# Patient Record
Sex: Female | Born: 1935 | Race: White | Hispanic: No | Smoking: Former smoker
Health system: Southern US, Community
[De-identification: ages and names within clinical notes are randomized; demographics above are authoritative.]

## PROBLEM LIST (undated history)

## (undated) DIAGNOSIS — A419 Sepsis, unspecified organism: Secondary | ICD-10-CM

## (undated) DIAGNOSIS — D6800 Von Willebrand disease, unspecified: Secondary | ICD-10-CM

## (undated) DIAGNOSIS — I4891 Unspecified atrial fibrillation: Secondary | ICD-10-CM

## (undated) HISTORY — DX: Sepsis, unspecified organism: A41.9

## (undated) HISTORY — PX: ABDOMINAL HYSTERECTOMY: SHX81

---

## 2004-03-10 ENCOUNTER — Ambulatory Visit: Payer: Self-pay | Admitting: Ophthalmology

## 2005-07-20 ENCOUNTER — Ambulatory Visit: Payer: Self-pay | Admitting: Family Medicine

## 2006-04-04 ENCOUNTER — Emergency Department: Payer: Self-pay | Admitting: Emergency Medicine

## 2006-12-25 ENCOUNTER — Emergency Department: Payer: Self-pay | Admitting: Emergency Medicine

## 2007-11-26 ENCOUNTER — Ambulatory Visit: Payer: Self-pay | Admitting: Family Medicine

## 2008-05-26 ENCOUNTER — Ambulatory Visit: Payer: Self-pay | Admitting: Otolaryngology

## 2008-10-20 ENCOUNTER — Ambulatory Visit: Payer: Self-pay | Admitting: Ophthalmology

## 2009-09-21 ENCOUNTER — Ambulatory Visit: Payer: Self-pay | Admitting: Family Medicine

## 2010-01-24 ENCOUNTER — Inpatient Hospital Stay: Payer: Self-pay | Admitting: Internal Medicine

## 2013-10-06 DIAGNOSIS — I10 Essential (primary) hypertension: Secondary | ICD-10-CM | POA: Insufficient documentation

## 2013-10-06 DIAGNOSIS — D6800 Von Willebrand disease, unspecified: Secondary | ICD-10-CM | POA: Insufficient documentation

## 2015-03-10 DIAGNOSIS — I482 Chronic atrial fibrillation, unspecified: Secondary | ICD-10-CM | POA: Insufficient documentation

## 2015-12-07 DIAGNOSIS — M546 Pain in thoracic spine: Secondary | ICD-10-CM | POA: Insufficient documentation

## 2017-03-12 ENCOUNTER — Emergency Department: Payer: Medicare HMO

## 2017-03-12 ENCOUNTER — Other Ambulatory Visit: Payer: Self-pay

## 2017-03-12 ENCOUNTER — Emergency Department
Admission: EM | Admit: 2017-03-12 | Discharge: 2017-03-12 | Disposition: A | Discharge status: Home / Self Care | Payer: Medicare HMO | Attending: Emergency Medicine | Admitting: Emergency Medicine

## 2017-03-12 DIAGNOSIS — Y929 Unspecified place or not applicable: Secondary | ICD-10-CM | POA: Insufficient documentation

## 2017-03-12 DIAGNOSIS — W109XXA Fall (on) (from) unspecified stairs and steps, initial encounter: Secondary | ICD-10-CM | POA: Diagnosis not present

## 2017-03-12 DIAGNOSIS — S022XXA Fracture of nasal bones, initial encounter for closed fracture: Secondary | ICD-10-CM | POA: Diagnosis not present

## 2017-03-12 DIAGNOSIS — S0990XA Unspecified injury of head, initial encounter: Secondary | ICD-10-CM | POA: Diagnosis present

## 2017-03-12 DIAGNOSIS — Y939 Activity, unspecified: Secondary | ICD-10-CM | POA: Diagnosis not present

## 2017-03-12 DIAGNOSIS — S42292A Other displaced fracture of upper end of left humerus, initial encounter for closed fracture: Secondary | ICD-10-CM | POA: Insufficient documentation

## 2017-03-12 DIAGNOSIS — S42212A Unspecified displaced fracture of surgical neck of left humerus, initial encounter for closed fracture: Secondary | ICD-10-CM

## 2017-03-12 DIAGNOSIS — Y999 Unspecified external cause status: Secondary | ICD-10-CM | POA: Diagnosis not present

## 2017-03-12 DIAGNOSIS — S0181XA Laceration without foreign body of other part of head, initial encounter: Secondary | ICD-10-CM

## 2017-03-12 HISTORY — DX: Unspecified atrial fibrillation: I48.91

## 2017-03-12 HISTORY — DX: Von Willebrand disease, unspecified: D68.00

## 2017-03-12 HISTORY — DX: Von Willebrand's disease: D68.0

## 2017-03-12 MED ORDER — LIDOCAINE-EPINEPHRINE 1 %-1:100000 IJ SOLN
10.0000 mL | Freq: Once | INTRAMUSCULAR | Status: AC
  Administered 2017-03-12: 10 mL via INTRADERMAL
  Filled 2017-03-12: qty 10

## 2017-03-12 MED ORDER — BUPIVACAINE HCL 0.5 % IJ SOLN
50.0000 mL | Freq: Once | INTRAMUSCULAR | Status: AC
  Administered 2017-03-12: 50 mL

## 2017-03-12 MED ORDER — FENTANYL CITRATE (PF) 100 MCG/2ML IJ SOLN
25.0000 ug | Freq: Once | INTRAMUSCULAR | Status: DC
  Filled 2017-03-12: qty 2

## 2017-03-12 MED ORDER — HYDROCODONE-ACETAMINOPHEN 5-325 MG PO TABS: 1.0000 | ORAL_TABLET | Freq: Four times a day (QID) | ORAL | 0 refills | Status: DC | PRN

## 2017-03-12 MED ORDER — BUPIVACAINE HCL (PF) 0.5 % IJ SOLN
INTRAMUSCULAR | Status: AC
  Administered 2017-03-12: 50 mL
  Filled 2017-03-12: qty 30

## 2017-03-12 NOTE — ED Triage Notes (Signed)
Pt comes via [facility] with c/o fall. Per EMS [facility] and slip and loss balance. Denies LOC. Pt does state left shoulder pain and headache. Pt is alert and oriented. Bp-186/104, HR-112, RR-18, 96% room air.

## 2017-03-12 NOTE — ED Notes (Signed)
Dr. Rifenbark at bedside 

## 2017-03-12 NOTE — Discharge Instructions (Signed)
Please keep your wounds clean and dry and follow-up with the ENT doctor in 5-7 days and with the orthopedic surgeon within 7 days for reevaluation.  Return to the emergency department for any concerns such as fevers, chills, worsening pain, or for any other issues whatsoever.  It was a pleasure to take care of you today, and thank you for coming to our emergency department.  If you have any questions or concerns before leaving please ask the nurse to grab me and I'm more than happy to go through your aftercare instructions again.  If you were prescribed any opioid pain medication today such as Norco, Vicodin, Percocet, morphine, hydrocodone, or oxycodone please make sure you do not drive when you are taking this medication as it can alter your ability to drive safely.  If you have any concerns once you are home that you are not improving or are in fact getting worse before you can make it to your follow-up appointment, please do not hesitate to call 911 and come back for further evaluation.  Merrily Brittle, MD  No results found for this or any previous visit. Ct Head Wo Contrast  Result Date: 03/12/2017 CLINICAL DATA:  Fall.  Loss of balance.  Headache EXAM: CT HEAD WITHOUT CONTRAST CT MAXILLOFACIAL WITHOUT CONTRAST TECHNIQUE: Multidetector CT imaging of the head and maxillofacial structures were performed using the standard protocol without intravenous contrast. Multiplanar CT image reconstructions of the maxillofacial structures were also generated. COMPARISON:  05/26/2008 FINDINGS: CT HEAD FINDINGS Brain: No evidence of acute infarction, hemorrhage, hydrocephalus, extra-axial collection or mass lesion/mass effect. There is prominence of the sulci and ventricles compatible with brain atrophy. There is mild diffuse low-attenuation within the subcortical and periventricular white matter compatible with chronic microvascular disease. Vascular: No hyperdense vessel or unexpected calcification. Skull:  Normal. Negative for fracture or focal lesion. Other: None. CT MAXILLOFACIAL FINDINGS Osseous: No fracture or mandibular dislocation. Acute fracture through the right side of the nasal bone identified, image 34 of series 6. Orbits: Negative. No traumatic or inflammatory finding. Sinuses: Clear Soft tissues: There is a large left-sided preseptal and frontal scout hematoma. IMPRESSION: 1. No acute intracranial abnormalities. 2. Brain atrophy and chronic small vessel ischemic change. 3. Right side of nasal bone fracture 4. Large left preseptal and frontal scout hematoma. Electronically Signed   By: Signa Kell M.D.   On: 03/12/2017 15:11   Dg Shoulder Left  Result Date: 03/12/2017 CLINICAL DATA:  Left shoulder injury after fall. EXAM: LEFT SHOULDER - 2+ VIEW COMPARISON:  None FINDINGS: There is an acute, slightly impacted and comminuted fracture deformity involving the surgical neck of humerus. Inferior displacement of the humeral head with respect to the glenoid fossa may reflect underlying hematoma/joint effusion. IMPRESSION: 1. Acute and slightly impacted fracture deformity involves the surgical neck of the humerus. Electronically Signed   By: Signa Kell M.D.   On: 03/12/2017 15:14   Ct Maxillofacial Wo Contrast  Result Date: 03/12/2017 CLINICAL DATA:  Fall.  Loss of balance.  Headache EXAM: CT HEAD WITHOUT CONTRAST CT MAXILLOFACIAL WITHOUT CONTRAST TECHNIQUE: Multidetector CT imaging of the head and maxillofacial structures were performed using the standard protocol without intravenous contrast. Multiplanar CT image reconstructions of the maxillofacial structures were also generated. COMPARISON:  05/26/2008 FINDINGS: CT HEAD FINDINGS Brain: No evidence of acute infarction, hemorrhage, hydrocephalus, extra-axial collection or mass lesion/mass effect. There is prominence of the sulci and ventricles compatible with brain atrophy. There is mild diffuse low-attenuation within the subcortical and  periventricular white matter compatible with chronic microvascular disease. Vascular: No hyperdense vessel or unexpected calcification. Skull: Normal. Negative for fracture or focal lesion. Other: None. CT MAXILLOFACIAL FINDINGS Osseous: No fracture or mandibular dislocation. Acute fracture through the right side of the nasal bone identified, image 34 of series 6. Orbits: Negative. No traumatic or inflammatory finding. Sinuses: Clear Soft tissues: There is a large left-sided preseptal and frontal scout hematoma. IMPRESSION: 1. No acute intracranial abnormalities. 2. Brain atrophy and chronic small vessel ischemic change. 3. Right side of nasal bone fracture 4. Large left preseptal and frontal scout hematoma. Electronically Signed   By: Signa Kellaylor  Stroud M.D.   On: 03/12/2017 15:11

## 2017-03-12 NOTE — ED Notes (Signed)
Pt refused fentanyl; Dr. Lamont Snowballifenbark aware.

## 2017-03-12 NOTE — ED Provider Notes (Signed)
[facility] Emergency Department Provider Note  ____________________________________________   First MD Initiated Contact with Patient 03/12/17 1648     (approximate)  I have reviewed the triage vital signs and the nursing notes.   HISTORY  Chief Complaint Fall   HPI Tracy Singh is a 84 y.o. female who comes to the emergency department after mechanical fall.  She was walking down marble stairs when she missed a step to step from the bottom fell forward and struck her left side and her face.  She denies loss of consciousness.  She is concerned because she has a past medical history of von Willebrand's disease.  She had sudden onset severe left shoulder pain.  Nonradiating.  Worse with movement improved with rest.  Her tetanus is up-to-date.  Past Medical History:  Diagnosis Date  . A-fib (HCC)   . Von Willebrand disease (HCC)     There are no active problems to display for this patient.   Past Surgical History:  Procedure Laterality Date  . ABDOMINAL HYSTERECTOMY      Prior to Admission medications   Medication Sig Start Date End Date Taking? Authorizing Provider  HYDROcodone-acetaminophen (NORCO) 5-325 MG tablet Take 1 tablet by mouth every 6 (six) hours as needed for up to 15 doses for severe pain. 03/12/17   Merrily Brittleifenbark, Jacquis Paxton, MD    Allergies Patient has no allergy information on record.  No family history on file.  Social History Social History   Tobacco Use  . Smoking status: Not on file  Substance Use Topics  . Alcohol use: Not on file  . Drug use: Not on file    Review of Systems Constitutional: No fever/chills Eyes: No visual changes. ENT: No sore throat. Cardiovascular: Denies chest pain. Respiratory: Denies shortness of breath. Gastrointestinal: No abdominal pain.  No nausea, no vomiting.  No diarrhea.  No constipation. Genitourinary: Negative for dysuria. Musculoskeletal: Positive for shoulder pain Skin: Positive  for wound Neurological: Positive for headache   ____________________________________________   PHYSICAL EXAM:  VITAL SIGNS: ED Triage Vitals  Enc Vitals Group     BP 03/12/17 1429 (!) 165/108     Pulse Rate 03/12/17 1429 (!) 56     Resp 03/12/17 1429 18     Temp 03/12/17 1429 97.6 F (36.4 C)     Temp Source 03/12/17 1429 Oral     SpO2 03/12/17 1429 94 %     Weight 03/12/17 1425 144 lb (65.3 kg)     Height 03/12/17 1425 5\' 1"  (1.549 m)     Head Circumference --      Peak Flow --      Pain Score 03/12/17 1425 8     Pain Loc --      Pain Edu? --      Excl. in GC? --     Constitutional: Alert and oriented x4 joking laughing although uncomfortable appearing Eyes: PERRL EOMI. mid range of brisk Head: Significant ecchymosis across her upper forehead and bridge of her nose.  2 lacerations as below. Nose: No congestion/rhinnorhea. Mouth/Throat: No trismus Neck: No stridor.   Cardiovascular: Normal rate, regular rhythm. Grossly normal heart sounds.  Good peripheral circulation. Respiratory: Normal respiratory effort.  No retractions. Lungs CTAB and moving good air Gastrointestinal: Soft nontender Musculoskeletal: Tenderness to proximal left shoulder neurovascularly intact Neurologic:  Normal speech and language. No gross focal neurologic deficits are appreciated. Skin: Facial lacerations noted.  First is 4 cm in the right forehead with muscular  involvement.  Second is above the left brow 2 cm Psychiatric: Mood and affect are normal. Speech and behavior are normal.    ____________________________________________   DIFFERENTIAL includes but not limited to  Mechanical fall, syncope, intracerebral hemorrhage, laceration, shoulder dislocation, shoulder fracture ____________________________________________   LABS (all labs ordered are listed, but only abnormal results are displayed)  Labs Reviewed - No data to  display   __________________________________________  EKG   ____________________________________________  RADIOLOGY  Head CT and face CTs reviewed by me show nasal fracture and fortunately no intracerebral hemorrhage Left shoulder x-ray reviewed by me shows proximal fracture ____________________________________________   PROCEDURES  Procedure(s) performed: Yes  .Nerve Block Date/Time: 03/12/2017 5:21 PM Performed by: Merrily Brittle, MD Authorized by: Merrily Brittle, MD   Consent:    Consent obtained:  Verbal   Consent given by:  Patient and healthcare agent   Risks discussed:  Infection, nerve damage, swelling, unsuccessful block and pain Indications:    Indications:  Pain relief Location:    Body area:  Upper extremity   Laterality:  Left Pre-procedure details:    Skin preparation:  2% chlorhexidine Skin anesthesia (see MAR for exact dosages):    Skin anesthesia method:  None Procedure details (see MAR for exact dosages):    Block needle gauge:  22 G   Anesthetic injected:  Bupivacaine 0.5% w/o epi Comments:     Hematoma block done with total of 20cc bupivicaine.  Got flash of blood.  Successful anesthesia .Marland KitchenLaceration Repair Date/Time: 03/12/2017 6:37 PM Performed by: Merrily Brittle, MD Authorized by: Merrily Brittle, MD   Consent:    Consent obtained:  Verbal   Consent given by:  Patient   Risks discussed:  Infection, pain, retained foreign body, poor cosmetic result and poor wound healing Anesthesia (see MAR for exact dosages):    Anesthesia method:  Local infiltration   Local anesthetic:  Lidocaine 1% WITH epi Laceration details:    Location:  Face   Length (cm):  4 Repair type:    Repair type:  Intermediate Exploration:    Hemostasis achieved with:  Direct pressure   Wound exploration: entire depth of wound probed and visualized     Contaminated: no   Treatment:    Area cleansed with:  Saline   Amount of cleaning:  Extensive   Irrigation  solution:  Sterile saline   Visualized foreign bodies/material removed: no   Skin repair:    Repair method:  Sutures   Suture size:  6-0   Suture material:  Nylon Approximation:    Approximation:  Close Post-procedure details:    Dressing:  Sterile dressing   Patient tolerance of procedure:  Tolerated well, no immediate complications Comments:     First laceration closed in 2 layers with 2 5-0 Vicryl sutures underneath and a total of 7 6-0 nylon sutures on top .Marland KitchenLaceration Repair Date/Time: 03/14/2017 11:32 PM Performed by: Merrily Brittle, MD Authorized by: Merrily Brittle, MD   Consent:    Consent obtained:  Verbal   Consent given by:  Patient   Risks discussed:  Infection, pain, retained foreign body, poor cosmetic result and poor wound healing Anesthesia (see MAR for exact dosages):    Anesthesia method:  Local infiltration   Local anesthetic:  Lidocaine 1% WITH epi Laceration details:    Location:  Face   Length (cm):  2 Repair type:    Repair type:  Simple Exploration:    Hemostasis achieved with:  Direct pressure   Wound exploration: entire  depth of wound probed and visualized     Contaminated: no   Treatment:    Area cleansed with:  Saline   Amount of cleaning:  Extensive   Irrigation solution:  Sterile saline   Visualized foreign bodies/material removed: no   Skin repair:    Repair method:  Sutures   Suture size:  6-0   Suture material:  Nylon Approximation:    Approximation:  Close Post-procedure details:    Dressing:  Sterile dressing   Patient tolerance of procedure:  Tolerated well, no immediate complications Comments:     A total of 3 6-0 nylon suture    Critical Care performed: no  Observation: no ____________________________________________   INITIAL IMPRESSION / ASSESSMENT AND PLAN / ED COURSE  Pertinent labs & imaging results that were available during my care of the patient were reviewed by me and considered in my medical decision making  (see chart for details).  By the time I saw the patient she had already had her imaging performed which shows a nasal fracture but no other intracerebral pathology.  She does have a proximal left humerus fracture.  Hematoma block performed at the patient and family's request with good anesthesia.  All wounds were washed out with normal saline at high pressure.  Wound was anesthetized with 1% lidocaine with epinephrine and then closed in layers with good cosmesis.  I will refer her to orthopedic surgery as well as ENT as now patient with the patient family verbalized understanding and agreed with the plan.   ____________________________________________   FINAL CLINICAL IMPRESSION(S) / ED DIAGNOSES  Final diagnoses:  Closed displaced fracture of surgical neck of left humerus, unspecified fracture morphology, initial encounter  Closed fracture of nasal bone, initial encounter  Facial laceration, initial encounter      NEW MEDICATIONS STARTED DURING THIS VISIT:  Discharge Medication List as of 03/12/2017  6:35 PM    START taking these medications   Details  HYDROcodone-acetaminophen (NORCO) 5-325 MG tablet Take 1 tablet by mouth every 6 (six) hours as needed for up to 15 doses for severe pain., Starting Sun 03/12/2017, Print         Note:  This document was prepared using Dragon voice recognition software and may include unintentional dictation errors.     Merrily Brittle, MD 03/14/17 2337

## 2018-03-20 ENCOUNTER — Emergency Department: Payer: Medicare HMO

## 2018-03-20 ENCOUNTER — Other Ambulatory Visit: Payer: Self-pay

## 2018-03-20 ENCOUNTER — Encounter: Payer: Self-pay | Admitting: Emergency Medicine

## 2018-03-20 ENCOUNTER — Inpatient Hospital Stay
Admission: EM | Admit: 2018-03-20 | Discharge: 2018-03-26 | DRG: 871 | Disposition: A | Discharge status: Home / Self Care | Payer: Medicare HMO | Attending: Internal Medicine | Admitting: Internal Medicine

## 2018-03-20 DIAGNOSIS — J189 Pneumonia, unspecified organism: Secondary | ICD-10-CM | POA: Diagnosis present

## 2018-03-20 DIAGNOSIS — J069 Acute upper respiratory infection, unspecified: Secondary | ICD-10-CM | POA: Diagnosis not present

## 2018-03-20 DIAGNOSIS — D680 Von Willebrand's disease: Secondary | ICD-10-CM | POA: Diagnosis present

## 2018-03-20 DIAGNOSIS — A419 Sepsis, unspecified organism: Secondary | ICD-10-CM | POA: Diagnosis not present

## 2018-03-20 DIAGNOSIS — Z79891 Long term (current) use of opiate analgesic: Secondary | ICD-10-CM

## 2018-03-20 DIAGNOSIS — Z7902 Long term (current) use of antithrombotics/antiplatelets: Secondary | ICD-10-CM

## 2018-03-20 DIAGNOSIS — E871 Hypo-osmolality and hyponatremia: Secondary | ICD-10-CM | POA: Diagnosis present

## 2018-03-20 DIAGNOSIS — I4891 Unspecified atrial fibrillation: Secondary | ICD-10-CM | POA: Diagnosis present

## 2018-03-20 DIAGNOSIS — Z90710 Acquired absence of both cervix and uterus: Secondary | ICD-10-CM

## 2018-03-20 DIAGNOSIS — Z809 Family history of malignant neoplasm, unspecified: Secondary | ICD-10-CM

## 2018-03-20 DIAGNOSIS — I10 Essential (primary) hypertension: Secondary | ICD-10-CM | POA: Diagnosis present

## 2018-03-20 LAB — CBC WITH DIFFERENTIAL/PLATELET
Abs Immature Granulocytes: 0.07 10*3/uL (ref 0.00–0.07)
BASOS PCT: 0 %
Basophils Absolute: 0 10*3/uL (ref 0.0–0.1)
EOS ABS: 0.1 10*3/uL (ref 0.0–0.5)
EOS PCT: 0 %
HCT: 39.6 % (ref 36.0–46.0)
Hemoglobin: 13.5 g/dL (ref 12.0–15.0)
Immature Granulocytes: 1 %
Lymphocytes Relative: 5 %
Lymphs Abs: 0.8 10*3/uL (ref 0.7–4.0)
MCH: 30.7 pg (ref 26.0–34.0)
MCHC: 34.1 g/dL (ref 30.0–36.0)
MCV: 90 fL (ref 80.0–100.0)
MONO ABS: 1.9 10*3/uL — AB (ref 0.1–1.0)
MONOS PCT: 12 %
Neutro Abs: 12.6 10*3/uL — ABNORMAL HIGH (ref 1.7–7.7)
Neutrophils Relative %: 82 %
PLATELETS: 253 10*3/uL (ref 150–400)
RBC: 4.4 MIL/uL (ref 3.87–5.11)
RDW: 13.3 % (ref 11.5–15.5)
WBC: 15.5 10*3/uL — ABNORMAL HIGH (ref 4.0–10.5)
nRBC: 0 % (ref 0.0–0.2)

## 2018-03-20 LAB — TROPONIN I: Troponin I: <0.03 ng/mL (ref <0.03)

## 2018-03-20 LAB — BASIC METABOLIC PANEL
Anion gap: 12 (ref 5–15)
BUN: 15 mg/dL (ref 8–23)
CHLORIDE: 96 mmol/L — AB (ref 98–111)
CO2: 23 mmol/L (ref 22–32)
Calcium: 8.8 mg/dL — ABNORMAL LOW (ref 8.9–10.3)
Creatinine, Ser: 0.64 mg/dL (ref 0.44–1.00)
GFR calc Af Amer: >60 mL/min (ref >60)
GFR calc non Af Amer: >60 mL/min (ref >60)
GLUCOSE: 142 mg/dL — AB (ref 70–99)
POTASSIUM: 3.9 mmol/L (ref 3.5–5.1)
Sodium: 131 mmol/L — ABNORMAL LOW (ref 135–145)

## 2018-03-20 LAB — INFLUENZA PANEL BY PCR (TYPE A & B)
INFLBPCR: NEGATIVE
Influenza A By PCR: NEGATIVE

## 2018-03-20 LAB — LACTIC ACID, PLASMA: Lactic Acid, Venous: 1.3 mmol/L (ref 0.5–1.9)

## 2018-03-20 LAB — BRAIN NATRIURETIC PEPTIDE: B Natriuretic Peptide: 294 pg/mL — ABNORMAL HIGH (ref 0.0–100.0)

## 2018-03-20 MED ORDER — SODIUM CHLORIDE 0.9 % IV BOLUS
500.0000 mL | Freq: Once | INTRAVENOUS | Status: AC
  Administered 2018-03-20: 500 mL via INTRAVENOUS

## 2018-03-20 MED ORDER — ACETAMINOPHEN 325 MG PO TABS
650.0000 mg | ORAL_TABLET | Freq: Once | ORAL | Status: AC
  Administered 2018-03-20: 650 mg via ORAL
  Filled 2018-03-20: qty 2

## 2018-03-20 NOTE — ED Provider Notes (Addendum)
[facility] Emergency Department Provider Note  ____________________________________________   I have reviewed the triage vital signs and the nursing notes. Where available I have reviewed prior notes and, if possible and indicated, outside hospital notes.    HISTORY  Chief Complaint Shortness of Breath and Cough    HPI Tracy Singh is a delightful  85 y.o. female   Who presents today complaining of cough and fever. Patient has had a cough for several weeks, she was on antibiotics, however she continues to have a cough and then over the last 2 days she is now developed a fever.  Cough is occasionally productive of sputum.  Sometimes the sputum is several brown but that she has had no hematemesis.  She or hemoptysis.  She has had no leg swelling, she has had somewhat decreased energy, no dysuria no urinary symptoms, she has had some trouble breathing with this cough which has been worsening and not improving and the fever which is new and she is brought in for those reasons.  She is not lethargic she is at her baseline in all other respects she has had a little bit of GI upset with some nausea and but no significant diarrhea and no melena no bright red blood per rectum.  No other alleviating or aggravating symptoms, no other prior interventions that she mentions.  No chest pain, does have a history of atrial fibrillation I do not see an old echocardiogram for her and I have read her notes from her cardiologist as well as all of our imaging here.  Is taking her blood pressure medication and her rate controlling metoprolol as scheduled today.   Past Medical History:  Diagnosis Date  . A-fib (HCC)   . Von Willebrand disease (HCC)     There are no active problems to display for this patient.   Past Surgical History:  Procedure Laterality Date  . ABDOMINAL HYSTERECTOMY      Prior to Admission medications   Medication Sig Start Date End Date Taking?  Authorizing Provider  HYDROcodone-acetaminophen (NORCO) 5-325 MG tablet Take 1 tablet by mouth every 6 (six) hours as needed for up to 15 doses for severe pain. 03/12/17   Merrily Brittle, MD    Allergies Patient has no allergy information on record.  No family history on file.  Social History Social History   Tobacco Use  . Smoking status: Never Smoker  . Smokeless tobacco: Never Used  Substance Use Topics  . Alcohol use: Not on file  . Drug use: Not on file    Review of Systems Constitutional: + fever Eyes: No visual changes. ENT: No sore throat. No stiff neck no neck pain Cardiovascular: Denies chest pain. Respiratory: + shortness of breath. Gastrointestinal:   no vomiting.  No diarrhea.  No constipation. Genitourinary: Negative for dysuria. Musculoskeletal: Negative lower extremity swelling Skin: Negative for rash. Neurological: Negative for severe headaches, focal weakness or numbness.   ____________________________________________   PHYSICAL EXAM:  VITAL SIGNS: ED Triage Vitals  Enc Vitals Group     BP 03/20/18 2104 (!) 146/91     Pulse Rate 03/20/18 2104 (!) 157     Resp 03/20/18 2104 (!) 22     Temp 03/20/18 2104 98.4 F (36.9 C)     Temp Source 03/20/18 2104 Oral     SpO2 03/20/18 2104 93 %     Weight --      Height --      Head Circumference --  Peak Flow --      Pain Score 03/20/18 2102 0     Pain Loc --      Pain Edu? --      Excl. in GC? --     Constitutional: Alert and oriented. Well appearing and in no acute distress. Eyes: Conjunctivae are normal Head: Atraumatic HEENT: No congestion/rhinnorhea. Mucous membranes are moist.  Oropharynx non-erythematous Neck:   Nontender with no meningismus, no masses, no stridor Cardiovascular: Cardiac, regularly irregular grossly normal heart sounds.  Good peripheral circulation. Respiratory: Occasional bibasilar rhonchi which seems to clear with cough, speaks in full sentences, slight wheeze  initially appreciated which clears with deep breath Abdominal: Soft and nontender. No distention. No guarding no rebound Back:  There is no focal tenderness or step off.  there is no midline tenderness there are no lesions noted. there is no CVA tenderness  Musculoskeletal: No lower extremity tenderness, no upper extremity tenderness. No joint effusions, no DVT signs strong distal pulses no edema Neurologic:  Normal speech and language. No gross focal neurologic deficits are appreciated.  Skin:  Skin is warm, dry and intact. No rash noted. Psychiatric: Mood and affect are normal. Speech and behavior are normal.  ____________________________________________   LABS (all labs ordered are listed, but only abnormal results are displayed)  Labs Reviewed  CULTURE, BLOOD (ROUTINE X 2)  CULTURE, BLOOD (ROUTINE X 2)  CBC WITH DIFFERENTIAL/PLATELET  LACTIC ACID, PLASMA  LACTIC ACID, PLASMA  TROPONIN I  BRAIN NATRIURETIC PEPTIDE  BASIC METABOLIC PANEL  INFLUENZA PANEL BY PCR (TYPE A & B)    Pertinent labs  results that were available during my care of the patient were reviewed by me and considered in my medical decision making (see chart for details). ____________________________________________  EKG  I personally interpreted any EKGs ordered by me or triage Atrial fibrillation, RVR rate 154, no acute ST elevation or depression nonspecific ST changes no acute ischemia ____________________________________________  RADIOLOGY  Pertinent labs & imaging results that were available during my care of the patient were reviewed by me and considered in my medical decision making (see chart for details). If possible, patient and/or family made aware of any abnormal findings.  No results found. ____________________________________________    PROCEDURES  Procedure(s) performed: None  Procedures  Critical Care performed: None  ____________________________________________   INITIAL  IMPRESSION / ASSESSMENT AND PLAN / ED COURSE  Pertinent labs & imaging results that were available during my care of the patient were reviewed by me and considered in my medical decision making (see chart for details).  Patient here with fever and cough for the last couple days.  She is in A. fib with RVR but her baseline rhythm is atrial fibrillation so we will hold off on acutely treating that we get a better sense of what is going on I will give her some fluid but given that I do not know her EF we will be gentle with that, I do not think her primary pathology is atrial fibrillation, is hard to know to what extent her disease process is increasing her heart rate is a reactive issue.  We will give her Tylenol though she is not document is febrile here she feels warm to me clinically and we will evaluate her for flu as well as other pulmonary pathology such as pneumonia.  Likely has the flu ----------------------------------------- 11:28 PM on 03/20/2018 -----------------------------------------  Flu is negative, abdomen remains benign, she still has a cough, heart rate is still  up but coming down with fluids, given her baseline is atrial fibrillation, it is I think in her best interest for [place] to treat the underlying causes which I feel or her infection.  Urinalysis is pending, BNP is mildly elevated which we will watch.  Patient is in no acute distress. Signed out to dr Anne Hahn for admission at this time.    ____________________________________________   FINAL CLINICAL IMPRESSION(S) / ED DIAGNOSES  Final diagnoses:  None      This chart was dictated using voice recognition software.  Despite best efforts to proofread,  errors can occur which can change meaning.      Jeanmarie Plant, MD 03/20/18 2138    Jeanmarie Plant, MD 03/20/18 2329

## 2018-03-20 NOTE — ED Notes (Signed)
This tech and Misty Stanley, EDT ambulated pt to the toilet and was able to obtain a urine sample. Sent urine sample to lab.

## 2018-03-20 NOTE — ED Notes (Signed)
md in with pt and family.   

## 2018-03-20 NOTE — ED Notes (Signed)
Pt reports a cough for 5 weeks.  Pt saw a doctor and finished abx 3 days ago.  No chest pain.  Pt also has sob.  Former smoker.  Iv started and meds given.  Family with pt  Pt alert   Speech clear.

## 2018-03-20 NOTE — ED Triage Notes (Signed)
Pt to triage via w/c, mask in place; prod cough brown sputum x 5wks; seen PCP last wk, completed antibiotics for "bronchial infection"; began fever and nausea yesterday

## 2018-03-21 ENCOUNTER — Encounter: Payer: Self-pay | Admitting: Internal Medicine

## 2018-03-21 DIAGNOSIS — I4891 Unspecified atrial fibrillation: Secondary | ICD-10-CM | POA: Diagnosis present

## 2018-03-21 DIAGNOSIS — A419 Sepsis, unspecified organism: Secondary | ICD-10-CM | POA: Diagnosis present

## 2018-03-21 DIAGNOSIS — I10 Essential (primary) hypertension: Secondary | ICD-10-CM | POA: Diagnosis present

## 2018-03-21 DIAGNOSIS — Z809 Family history of malignant neoplasm, unspecified: Secondary | ICD-10-CM | POA: Diagnosis not present

## 2018-03-21 DIAGNOSIS — J189 Pneumonia, unspecified organism: Secondary | ICD-10-CM | POA: Diagnosis present

## 2018-03-21 DIAGNOSIS — Z79891 Long term (current) use of opiate analgesic: Secondary | ICD-10-CM | POA: Diagnosis not present

## 2018-03-21 DIAGNOSIS — Z7902 Long term (current) use of antithrombotics/antiplatelets: Secondary | ICD-10-CM | POA: Diagnosis not present

## 2018-03-21 DIAGNOSIS — E871 Hypo-osmolality and hyponatremia: Secondary | ICD-10-CM | POA: Diagnosis present

## 2018-03-21 DIAGNOSIS — D680 Von Willebrand's disease: Secondary | ICD-10-CM | POA: Diagnosis present

## 2018-03-21 DIAGNOSIS — J069 Acute upper respiratory infection, unspecified: Secondary | ICD-10-CM | POA: Diagnosis present

## 2018-03-21 DIAGNOSIS — Z90710 Acquired absence of both cervix and uterus: Secondary | ICD-10-CM | POA: Diagnosis not present

## 2018-03-21 HISTORY — DX: Sepsis, unspecified organism: A41.9

## 2018-03-21 LAB — URINALYSIS, COMPLETE (UACMP) WITH MICROSCOPIC
Bacteria, UA: NONE SEEN
Bilirubin Urine: NEGATIVE
Glucose, UA: NEGATIVE mg/dL
Ketones, ur: 20 mg/dL — AB
Nitrite: NEGATIVE
Protein, ur: 30 mg/dL — AB
Specific Gravity, Urine: 1.023 (ref 1.005–1.030)
pH: 5 (ref 5.0–8.0)

## 2018-03-21 LAB — TSH: TSH: 0.587 u[IU]/mL (ref 0.350–4.500)

## 2018-03-21 MED ORDER — DOCUSATE SODIUM 100 MG PO CAPS
100.0000 mg | ORAL_CAPSULE | Freq: Two times a day (BID) | ORAL | Status: DC
  Administered 2018-03-21: 100 mg via ORAL
  Filled 2018-03-21 (×4): qty 1

## 2018-03-21 MED ORDER — ONDANSETRON HCL 4 MG/2ML IJ SOLN
4.0000 mg | Freq: Four times a day (QID) | INTRAMUSCULAR | Status: DC | PRN
  Filled 2018-03-21: qty 2

## 2018-03-21 MED ORDER — BENZONATATE 100 MG PO CAPS
100.0000 mg | ORAL_CAPSULE | Freq: Three times a day (TID) | ORAL | Status: DC
  Administered 2018-03-21 – 2018-03-26 (×11): 100 mg via ORAL
  Filled 2018-03-21 (×12): qty 1

## 2018-03-21 MED ORDER — ONDANSETRON HCL 4 MG PO TABS: 4.0000 mg | ORAL_TABLET | Freq: Four times a day (QID) | ORAL | Status: DC | PRN

## 2018-03-21 MED ORDER — SODIUM CHLORIDE 0.9 % IV SOLN
INTRAVENOUS | Status: DC
  Administered 2018-03-21 – 2018-03-23 (×7): via INTRAVENOUS

## 2018-03-21 MED ORDER — LISINOPRIL 5 MG PO TABS
5.0000 mg | ORAL_TABLET | Freq: Every day | ORAL | Status: DC
  Administered 2018-03-21 – 2018-03-26 (×6): 5 mg via ORAL
  Filled 2018-03-21 (×6): qty 1

## 2018-03-21 MED ORDER — METOPROLOL SUCCINATE ER 50 MG PO TB24
50.0000 mg | ORAL_TABLET | Freq: Every day | ORAL | Status: DC
  Administered 2018-03-21 – 2018-03-22 (×2): 50 mg via ORAL
  Filled 2018-03-21 (×2): qty 1

## 2018-03-21 MED ORDER — AZITHROMYCIN 250 MG PO TABS
500.0000 mg | ORAL_TABLET | Freq: Every day | ORAL | Status: AC
  Administered 2018-03-21 – 2018-03-23 (×3): 500 mg via ORAL
  Filled 2018-03-21: qty 2
  Filled 2018-03-21: qty 1
  Filled 2018-03-21: qty 2

## 2018-03-21 MED ORDER — DABIGATRAN ETEXILATE MESYLATE 150 MG PO CAPS
150.0000 mg | ORAL_CAPSULE | Freq: Two times a day (BID) | ORAL | Status: DC
  Administered 2018-03-21 – 2018-03-26 (×11): 150 mg via ORAL
  Filled 2018-03-21 (×14): qty 1

## 2018-03-21 MED ORDER — ACETAMINOPHEN 650 MG RE SUPP: 650.0000 mg | Freq: Four times a day (QID) | RECTAL | Status: DC | PRN

## 2018-03-21 MED ORDER — SODIUM CHLORIDE 0.9 % IV SOLN
1.0000 g | INTRAVENOUS | Status: DC
  Administered 2018-03-21 – 2018-03-25 (×6): 1 g via INTRAVENOUS
  Filled 2018-03-21: qty 1
  Filled 2018-03-21: qty 10
  Filled 2018-03-21 (×4): qty 1
  Filled 2018-03-21: qty 10

## 2018-03-21 MED ORDER — ADULT MULTIVITAMIN W/MINERALS CH
1.0000 | ORAL_TABLET | Freq: Every day | ORAL | Status: DC
  Administered 2018-03-21 – 2018-03-26 (×6): 1 via ORAL
  Filled 2018-03-21 (×6): qty 1

## 2018-03-21 MED ORDER — ACETAMINOPHEN 325 MG PO TABS
650.0000 mg | ORAL_TABLET | Freq: Four times a day (QID) | ORAL | Status: DC | PRN
  Administered 2018-03-23 (×2): 650 mg via ORAL
  Filled 2018-03-21 (×2): qty 2

## 2018-03-21 MED ORDER — DILTIAZEM HCL 25 MG/5ML IV SOLN
5.0000 mg | INTRAVENOUS | Status: AC
  Administered 2018-03-21: 5 mg via INTRAVENOUS
  Filled 2018-03-21: qty 5

## 2018-03-21 NOTE — ED Notes (Signed)
ED TO INPATIENT HANDOFF REPORT  Name/Age/Gender Lang Tracy Singh 85 y.o. female  Code Status    Code Status Orders  (From admission, onward)         Start     Ordered   03/21/18 0222  Full code  Continuous     03/21/18 0221        Code Status History    This patient has a current code status but no historical code status.      Home/SNF/Other Home  Chief Complaint SOB  Level of Care/Admitting Diagnosis ED Disposition    ED Disposition Condition Comment   Admit  Hospital Area: [facility] [[id number]]  Level of Care: Telemetry [5]  Diagnosis: Sepsis [facility]) [[id number]]  Admitting Physician: Arnaldo Natal [[id number]]  Attending Physician: Arnaldo Natal [[id number]]  Estimated length of stay: past midnight tomorrow  Certification:: I certify this patient will need inpatient services for at least 2 midnights  PT Class (Do Not Modify): Inpatient [101]  PT Acc Code (Do Not Modify): Private [1]       Medical History Past Medical History:  Diagnosis Date  . A-fib (HCC)   . Von Willebrand disease (HCC)     Allergies Not on File  IV Location/Drains/Wounds Patient Lines/Drains/Airways Status   Active Line/Drains/Airways    Name:   Placement date:   Placement time:   Site:   Days:   Peripheral IV 03/12/17 Right Forearm   03/12/17    1442    Forearm   374          Labs/Imaging Results for orders placed or performed during the hospital encounter of 03/20/18 (from the past 48 hour(s))  CBC with Differential     Status: Abnormal   Collection Time: 03/20/18  9:28 PM  Result Value Ref Range   WBC 15.5 (H) 4.0 - 10.5 K/uL   RBC 4.40 3.87 - 5.11 MIL/uL   Hemoglobin 13.5 12.0 - 15.0 g/dL   HCT 79.8 92.1 - 19.4 %   MCV 90.0 80.0 - 100.0 fL   MCH 30.7 26.0 - 34.0 pg   MCHC 34.1 30.0 - 36.0 g/dL   RDW 17.4 08.1 - 44.8 %   Platelets 253 150 - 400 K/uL   nRBC 0.0 0.0 - 0.2 %   Neutrophils Relative % 82 %   Neutro Abs 12.6 (H) 1.7 - 7.7  K/uL   Lymphocytes Relative 5 %   Lymphs Abs 0.8 0.7 - 4.0 K/uL   Monocytes Relative 12 %   Monocytes Absolute 1.9 (H) 0.1 - 1.0 K/uL   Eosinophils Relative 0 %   Eosinophils Absolute 0.1 0.0 - 0.5 K/uL   Basophils Relative 0 %   Basophils Absolute 0.0 0.0 - 0.1 K/uL   Immature Granulocytes 1 %   Abs Immature Granulocytes 0.07 0.00 - 0.07 K/uL    Comment: Performed at [facility], [address] [place], [place] [address]  Troponin I - Once     Status: None   Collection Time: 03/20/18  9:28 PM  Result Value Ref Range   Troponin I <0.03 <0.03 ng/mL    Comment: Performed at [facility] [address] [place], [place] [address]  Brain natriuretic peptide     Status: Abnormal   Collection Time: 03/20/18  9:28 PM  Result Value Ref Range   B Natriuretic Peptide 294.0 (H) 0.0 - 100.0 pg/mL    Comment: Performed at [facility], [address] [place],  [place] [address]  Basic metabolic panel     Status: Abnormal   Collection Time: 03/20/18  9:28 PM  Result Value Ref Range   Sodium 131 (L) 135 - 145 mmol/L   Potassium 3.9 3.5 - 5.1 mmol/L   Chloride 96 (L) 98 - 111 mmol/L   CO2 23 22 - 32 mmol/L   Glucose, Bld 142 (H) 70 - 99 mg/dL   BUN 15 8 - 23 mg/dL   Creatinine, Ser 6.040.64 0.44 - 1.00 mg/dL   Calcium 8.8 (L) 8.9 - 10.3 mg/dL   GFR calc non Af Amer >60 >60 mL/min   GFR calc Af Amer >60 >60 mL/min   Anion gap 12 5 - 15    Comment: Performed at [facility], [address] [place], [place] [address]  Influenza panel by PCR (type A & B)     Status: None   Collection Time: 03/20/18  9:28 PM  Result Value Ref Range   Influenza A By PCR NEGATIVE NEGATIVE   Influenza B By PCR NEGATIVE NEGATIVE    Comment: (NOTE) The Xpert Xpress Flu assay is intended as an aid in the diagnosis of  influenza and should not be used as a sole basis for treatment.  This  assay is FDA approved for nasopharyngeal swab specimens only. Nasal  washings and  aspirates are unacceptable for Xpert Xpress Flu testing. Performed at [facility], [address] [place], [place] [address]   Urinalysis, Complete w Microscopic     Status: Abnormal   Collection Time: 03/20/18  9:28 PM  Result Value Ref Range   Color, Urine YELLOW (A) YELLOW   APPearance CLOUDY (A) CLEAR   Specific Gravity, Urine 1.023 1.005 - 1.030   pH 5.0 5.0 - 8.0   Glucose, UA NEGATIVE NEGATIVE mg/dL   Hgb urine dipstick SMALL (A) NEGATIVE   Bilirubin Urine NEGATIVE NEGATIVE   Ketones, ur 20 (A) NEGATIVE mg/dL   Protein, ur 30 (A) NEGATIVE mg/dL   Nitrite NEGATIVE NEGATIVE   Leukocytes,Ua MODERATE (A) NEGATIVE   RBC / HPF 6-10 0 - 5 RBC/hpf   WBC, UA 21-50 0 - 5 WBC/hpf   Bacteria, UA NONE SEEN NONE SEEN   Squamous Epithelial / LPF 21-50 0 - 5   Mucus PRESENT     Comment: Performed at [facility], [address] [place], [place] [address]  TSH     Status: None   Collection Time: 03/20/18  9:28 PM  Result Value Ref Range   TSH 0.587 0.350 - 4.500 uIU/mL    Comment: Performed by a 3rd Generation assay with a functional sensitivity of <=0.01 uIU/mL. Performed at [facility], [address] [place], [place] [address]   Lactic acid, plasma     Status: None   Collection Time: 03/20/18  9:42 PM  Result Value Ref Range   Lactic Acid, Venous 1.3 0.5 - 1.9 mmol/L    Comment: Performed at [facility], [address] [place], [place] [address]  Culture, blood (routine x 2)     Status: None (Preliminary result)   Collection Time: 03/20/18  9:43 PM  Result Value Ref Range   Specimen Description BLOOD RIGHT ARM    Special Requests      BOTTLES DRAWN AEROBIC AND ANAEROBIC Blood Culture results may not be optimal due to an excessive volume of blood received in culture bottles   Culture      NO GROWTH < 12 HOURS Performed  at [facility], [address] [place], [place] [address]    Report Status PENDING   Culture, blood  (routine x 2)     Status: None (Preliminary result)   Collection Time: 03/20/18  9:43 PM  Result Value Ref Range   Specimen Description BLOOD LEFT ARM    Special Requests      BOTTLES DRAWN AEROBIC AND ANAEROBIC Blood Culture results may not be optimal due to an excessive volume of blood received in culture bottles   Culture      NO GROWTH < 12 HOURS Performed at [facility], [address], [place], [place] [address]    Report Status PENDING    Dg Chest Portable 1 View  Result Date: 03/20/2018 CLINICAL DATA:  Productive cough EXAM: PORTABLE CHEST 1 VIEW COMPARISON:  01/27/2010 FINDINGS: Cardiomegaly. Left basilar airspace opacity noted, similar prior study, likely scarring. Right lung clear. No effusions or acute bony abnormality. IMPRESSION: Cardiomegaly.  Left basilar scarring.  No active disease. Electronically Signed   By: Charlett Nose M.D.   On: 03/20/2018 21:34    Pending Labs Unresulted Labs (From admission, onward)    Start     Ordered   03/22/18 0500  Basic metabolic panel  Tomorrow morning,   STAT     03/21/18 1449   03/22/18 0500  CBC  Tomorrow morning,   STAT     03/21/18 1449   03/22/18 0500  Magnesium  Tomorrow morning,   STAT     03/21/18 1449   03/22/18 0500  Phosphorus  Tomorrow morning,   STAT     03/21/18 1449          Vitals/Pain Today's Vitals   03/21/18 1530 03/21/18 1600 03/21/18 1630 03/21/18 1800  BP: (!) 144/62 (!) 146/82 (!) 130/111 135/73  Pulse: (!) 108 (!) 109 97 (!) 110  Resp: (!) 28 (!) 33 (!) 33 (!) 37  Temp:      TempSrc:      SpO2: 97% 98% 97% 98%  PainSc:    0-No pain    Isolation Precautions No active isolations  Medications Medications  dabigatran (PRADAXA) capsule 150 mg (150 mg Oral Given 03/21/18 0907)  lisinopril (PRINIVIL,ZESTRIL) tablet 5 mg (5 mg Oral Given 03/21/18 0908)  metoprolol succinate (TOPROL-XL) 24 hr tablet 50 mg (50 mg Oral Given 03/21/18 0907)  multivitamin with minerals tablet 1 tablet (1 tablet  Oral Given 03/21/18 0907)  0.9 %  sodium chloride infusion ( Intravenous New Bag/Given 03/21/18 0226)  acetaminophen (TYLENOL) tablet 650 mg (has no administration in time range)    Or  acetaminophen (TYLENOL) suppository 650 mg (has no administration in time range)  docusate sodium (COLACE) capsule 100 mg (100 mg Oral Refused 03/21/18 0907)  ondansetron (ZOFRAN) tablet 4 mg (has no administration in time range)    Or  ondansetron (ZOFRAN) injection 4 mg (has no administration in time range)  cefTRIAXone (ROCEPHIN) 1 g in sodium chloride 0.9 % 100 mL IVPB (0 g Intravenous Stopped 03/21/18 0134)  azithromycin (ZITHROMAX) tablet 500 mg (has no administration in time range)  benzonatate (TESSALON) capsule 100 mg (100 mg Oral Given 03/21/18 1619)  acetaminophen (TYLENOL) tablet 650 mg (650 mg Oral Given 03/20/18 2148)  sodium chloride 0.9 % bolus 500 mL (0 mLs Intravenous Stopped 03/20/18 2308)  diltiazem (CARDIZEM) injection 5 mg (5 mg Intravenous Given 03/21/18 0030)    Mobility walks

## 2018-03-21 NOTE — ED Notes (Signed)
Report off to henry rn  

## 2018-03-21 NOTE — H&P (Signed)
Tracy Singh is an 85 y.o. female.   Chief Complaint: Shortness of breath HPI: The patient with past medical history of von Willebrand's disease as well as atrial fibrillation presents to the emergency department complaining of shortness of breath.  This is uncommon for her as the patient is in overall excellent health.  She attends the gym 3 times a week and walks up a flight of stairs to her apartment every day.  She also lives alone.  However, she noticed she felt chills during a water aerobics class 2 days ago.  She was found to have a fever of 101 F.  Then, yesterday the patient developed shortness of breath .  The patient also has been coughing for nearly 5 weeks according to her daughter.  She completed an outpatient course of antibiotics approximately 8 days ago but the cough has recently become productive of brown sputum.  Vital signs met criteria for sepsis which prompted the initiation of broad-spectrum antibiotics prior to the emergency department staff calling the hospitalist service for admission  Past Medical History:  Diagnosis Date  . A-fib ([place])   . Von Willebrand disease ([place])     Past Surgical History:  Procedure Laterality Date  . ABDOMINAL HYSTERECTOMY      Family History  Problem Relation Age of Onset  . Cancer Father   . Cancer Brother    Social History:  reports that she has never smoked. She has never used smokeless tobacco. No history on file for alcohol and drug.  Allergies: Not on File  Prior to Admission medications   Medication Sig Start Date End Date Taking? Authorizing Provider  lisinopril (PRINIVIL,ZESTRIL) 5 MG tablet Take 5 mg by mouth daily. 03/24/16  Yes [provider]  metoprolol succinate (TOPROL-XL) 50 MG 24 hr tablet Take 50 mg by mouth daily. 03/24/16  Yes [provider]  Multiple Vitamin (MULTIVITAMIN) capsule Take 1 capsule by mouth daily.   Yes [provider]  PRADAXA 150 MG CAPS capsule Take 150 mg by  mouth. bid 02/23/18  Yes [provider]     Results for orders placed or performed during the hospital encounter of 03/20/18 (from the past 48 hour(s))  CBC with Differential     Status: Abnormal   Collection Time: 03/20/18  9:28 PM  Result Value Ref Range   WBC 15.5 (H) 4.0 - 10.5 K/uL   RBC 4.40 3.87 - 5.11 MIL/uL   Hemoglobin 13.5 12.0 - 15.0 g/dL   HCT 39.6 36.0 - 46.0 %   MCV 90.0 80.0 - 100.0 fL   MCH 30.7 26.0 - 34.0 pg   MCHC 34.1 30.0 - 36.0 g/dL   RDW 13.3 11.5 - 15.5 %   Platelets 253 150 - 400 K/uL   nRBC 0.0 0.0 - 0.2 %   Neutrophils Relative % 82 %   Neutro Abs 12.6 (H) 1.7 - 7.7 K/uL   Lymphocytes Relative 5 %   Lymphs Abs 0.8 0.7 - 4.0 K/uL   Monocytes Relative 12 %   Monocytes Absolute 1.9 (H) 0.1 - 1.0 K/uL   Eosinophils Relative 0 %   Eosinophils Absolute 0.1 0.0 - 0.5 K/uL   Basophils Relative 0 %   Basophils Absolute 0.0 0.0 - 0.1 K/uL   Immature Granulocytes 1 %   Abs Immature Granulocytes 0.07 0.00 - 0.07 K/uL    Comment: Performed at [facility], [address], [place], [place] [address]  Troponin I - Once  Status: None   Collection Time: 03/20/18  9:28 PM  Result Value Ref Range   Troponin I <0.03 <0.03 ng/mL    Comment: Performed at [facility], [address] [place], [place] [address]  Brain natriuretic peptide     Status: Abnormal   Collection Time: 03/20/18  9:28 PM  Result Value Ref Range   B Natriuretic Peptide 294.0 (H) 0.0 - 100.0 pg/mL    Comment: Performed at [facility], [address] [place], [place] [address]  Basic metabolic panel     Status: Abnormal   Collection Time: 03/20/18  9:28 PM  Result Value Ref Range   Sodium 131 (L) 135 - 145 mmol/L   Potassium 3.9 3.5 - 5.1 mmol/L   Chloride 96 (L) 98 - 111 mmol/L   CO2 23 22 - 32 mmol/L   Glucose, Bld 142 (H) 70 - 99 mg/dL   BUN 15 8 - 23 mg/dL   Creatinine, Ser 0.64 0.44 - 1.00 mg/dL   Calcium 8.8 (L) 8.9 - 10.3 mg/dL   GFR calc  non Af Amer >60 >60 mL/min   GFR calc Af Amer >60 >60 mL/min   Anion gap 12 5 - 15    Comment: Performed at [facility], [address], [place], [place] [address]  Influenza panel by PCR (type A & B)     Status: None   Collection Time: 03/20/18  9:28 PM  Result Value Ref Range   Influenza A By PCR NEGATIVE NEGATIVE   Influenza B By PCR NEGATIVE NEGATIVE    Comment: (NOTE) The Xpert Xpress Flu assay is intended as an aid in the diagnosis of  influenza and should not be used as a sole basis for treatment.  This  assay is FDA approved for nasopharyngeal swab specimens only. Nasal  washings and aspirates are unacceptable for Xpert Xpress Flu testing. Performed at [facility], [address] [place], [place] [address]   Urinalysis, Complete w Microscopic     Status: Abnormal   Collection Time: 03/20/18  9:28 PM  Result Value Ref Range   Color, Urine YELLOW (A) YELLOW   APPearance CLOUDY (A) CLEAR   Specific Gravity, Urine 1.023 1.005 - 1.030   pH 5.0 5.0 - 8.0   Glucose, UA NEGATIVE NEGATIVE mg/dL   Hgb urine dipstick SMALL (A) NEGATIVE   Bilirubin Urine NEGATIVE NEGATIVE   Ketones, ur 20 (A) NEGATIVE mg/dL   Protein, ur 30 (A) NEGATIVE mg/dL   Nitrite NEGATIVE NEGATIVE   Leukocytes,Ua MODERATE (A) NEGATIVE   RBC / HPF 6-10 0 - 5 RBC/hpf   WBC, UA 21-50 0 - 5 WBC/hpf   Bacteria, UA NONE SEEN NONE SEEN   Squamous Epithelial / LPF 21-50 0 - 5   Mucus PRESENT     Comment: Performed at [facility], [address] [place], [place] [address]  TSH     Status: None   Collection Time: 03/20/18  9:28 PM  Result Value Ref Range   TSH 0.587 0.350 - 4.500 uIU/mL    Comment: Performed by a 3rd Generation assay with a functional sensitivity of <=0.01 uIU/mL. Performed at [facility], [address] [place], [place] [address]   Lactic acid, plasma     Status: None   Collection Time: 03/20/18  9:42 PM  Result Value Ref Range   Lactic Acid,  Venous 1.3 0.5 - 1.9 mmol/L    Comment: Performed at [facility], [address] [place], [place] [address]  Culture, blood (routine x 2)     Status: None (Preliminary result)   Collection Time: 03/20/18  9:43 PM  Result Value Ref Range   Specimen Description BLOOD RIGHT ARM    Special Requests      BOTTLES DRAWN AEROBIC AND ANAEROBIC Blood Culture results may not be optimal due to an excessive volume of blood received in culture bottles   Culture      NO GROWTH < 12 HOURS Performed at [facility], [address], [place], [place] [address]    Report Status PENDING   Culture, blood (routine x 2)     Status: None (Preliminary result)   Collection Time: 03/20/18  9:43 PM  Result Value Ref Range   Specimen Description BLOOD LEFT ARM    Special Requests      BOTTLES DRAWN AEROBIC AND ANAEROBIC Blood Culture results may not be optimal due to an excessive volume of blood received in culture bottles   Culture      NO GROWTH < 12 HOURS Performed at [facility], [address] [place], [place] [address]    Report Status PENDING    Dg Chest Portable 1 View  Result Date: 03/20/2018 CLINICAL DATA:  Productive cough EXAM: PORTABLE CHEST 1 VIEW COMPARISON:  01/27/2010 FINDINGS: Cardiomegaly. Left basilar airspace opacity noted, similar prior study, likely scarring. Right lung clear. No effusions or acute bony abnormality. IMPRESSION: Cardiomegaly.  Left basilar scarring.  No active disease. Electronically Signed   By: Rolm Baptise M.D.   On: 03/20/2018 21:34    Review of Systems  Constitutional: Negative for chills and fever.  HENT: Negative for sore throat and tinnitus.   Eyes: Negative for blurred vision and redness.  Respiratory: Positive for cough, sputum production and shortness of breath.   Cardiovascular: Negative for chest pain, palpitations, orthopnea and PND.  Gastrointestinal: Negative for abdominal pain, diarrhea, nausea and vomiting.   Genitourinary: Negative for dysuria, frequency and urgency.  Musculoskeletal: Negative for joint pain and myalgias.  Skin: Negative for rash.       No lesions  Neurological: Negative for speech change, focal weakness and weakness.  Endo/Heme/Allergies: Does not bruise/bleed easily.       No temperature intolerance  Psychiatric/Behavioral: Negative for depression and suicidal ideas.    Blood pressure (!) 149/75, pulse (!) 125, temperature 98.5 F (36.9 C), temperature source Oral, resp. rate (!) 30, SpO2 96 %. Physical Exam  Vitals reviewed. Constitutional: She is oriented to person, place, and time. She appears well-developed and well-nourished. No distress.  HENT:  Head: Normocephalic and atraumatic.  Mouth/Throat: Oropharynx is clear and moist.  Eyes: Pupils are equal, round, and reactive to light. Conjunctivae and EOM are normal. No scleral icterus.  Neck: Normal range of motion. Neck supple. No JVD present. No tracheal deviation present. No thyromegaly present.  Cardiovascular: Normal rate, regular rhythm and normal heart sounds. Exam reveals no gallop and no friction rub.  No murmur heard. Respiratory: Effort normal. She has rhonchi.  GI: Soft. Bowel sounds are normal. She exhibits no distension. There is no abdominal tenderness.  Genitourinary:    Genitourinary Comments: Deferred   Musculoskeletal: Normal range of motion.        General: No edema.  Lymphadenopathy:    She has no cervical adenopathy.  Neurological: She is alert and oriented to person, place, and time. No cranial nerve deficit. She exhibits normal muscle tone.  Skin: Skin is warm and  dry. No rash noted. No erythema.  Psychiatric: She has a normal mood and affect. Her behavior is normal. Judgment and thought content normal.     Assessment/Plan This is a 85 year old female admitted for sepsis. 1.  Sepsis: The patient meets criteria via tachycardia, tachypnea and leukocytosis.  She is hemodynamically stable.   Follow blood cultures for growth and sensitivities.  Obtain sputum sample if possible. 2.  Pneumonia: Community-acquired; chest x-ray read as scarring or atelectasis however the patient clinically has pneumonia.  Continue ceftriaxone and azithromycin. 3.  UTI: Present on admission.  Antibiotics will cover urinary bacteria 4.  Atrial fibrillation: Initially with rapid ventricular rate.  Now more controlled.  Continue metoprolol.  Consider distal antiarrhythmic while septic. 5.  Hypertension: Acceptable for age; continue lisinopril 6.  DVT prophylaxis: Pradaxa 7.  GI prophylaxis: None The patient is a full code.  Time spent on admission orders and patient care approximately 45 minutes  Harrie Foreman, MD 03/21/2018, 9:14 AM

## 2018-03-21 NOTE — Progress Notes (Signed)
[place] at [place] NAME: Tracy Singh    MR#:  [id number]  DATE OF BIRTH:  [birth year]  SUBJECTIVE:  CHIEF COMPLAINT:   Chief Complaint  Patient presents with  . Shortness of Breath  . Cough   No new complaint.  Patient gradually feeling better.  Still having intermittent cough.  Placed on PRN Tessalon this morning. REVIEW OF SYSTEMS:  Review of Systems  Constitutional: Negative for chills.       Fevers at home  HENT: Negative for hearing loss and tinnitus.   Eyes: Negative for blurred vision and double vision.  Respiratory: Positive for cough and shortness of breath.   Cardiovascular: Negative for chest pain, palpitations and orthopnea.  Gastrointestinal: Negative for heartburn and nausea.  Genitourinary: Negative for dysuria and urgency.  Musculoskeletal: Negative for myalgias and neck pain.  Skin: Negative for itching and rash.  Neurological: Negative for dizziness, tingling and headaches.  Psychiatric/Behavioral: Negative for depression and suicidal ideas.    DRUG ALLERGIES:  Not on File VITALS:  Blood pressure 132/76, pulse (!) 106, temperature 98.2 F (36.8 C), temperature source Oral, resp. rate (!) 23, SpO2 96 %. PHYSICAL EXAMINATION:    Physical Exam  Constitutional: She is oriented to person, place, and time. She appears well-developed and well-nourished.  HENT:  Head: Normocephalic and atraumatic.  Eyes: Pupils are equal, round, and reactive to light. Conjunctivae are normal. Right eye exhibits no discharge.  Neck: Normal range of motion. Neck supple. No tracheal deviation present.  Cardiovascular: Normal rate, regular rhythm and normal heart sounds.  Respiratory: Effort normal. She has rales.  GI: Soft. Bowel sounds are normal. There is no abdominal tenderness.  Musculoskeletal: Normal range of motion.        General: No edema.  Neurological: She is alert and oriented to person, place, and time. No  cranial nerve deficit.  Skin: Skin is warm. She is not diaphoretic. No erythema.  Psychiatric: She has a normal mood and affect. Her behavior is normal.   LABORATORY PANEL:  Female CBC Recent Labs  Lab 03/20/18 2128  WBC 15.5*  HGB 13.5  HCT 39.6  PLT 253   ------------------------------------------------------------------------------------------------------------------ Chemistries  Recent Labs  Lab 03/20/18 2128  NA 131*  K 3.9  CL 96*  CO2 23  GLUCOSE 142*  BUN 15  CREATININE 0.64  CALCIUM 8.8*   RADIOLOGY:  Dg Chest Portable 1 View  Result Date: 03/20/2018 CLINICAL DATA:  Productive cough EXAM: PORTABLE CHEST 1 VIEW COMPARISON:  01/27/2010 FINDINGS: Cardiomegaly. Left basilar airspace opacity noted, similar prior study, likely scarring. Right lung clear. No effusions or acute bony abnormality. IMPRESSION: Cardiomegaly.  Left basilar scarring.  No active disease. Electronically Signed   By: Rolm Baptise M.D.   On: 03/20/2018 21:34   ASSESSMENT AND PLAN:   This is a 85 year old female admitted for sepsis. 1.  Sepsis secondary to pneumonia which was diagnosed clinically.:  Patient met criteria via tachycardia, tachypnea and leukocytosis.  She is hemodynamically stable.  Follow blood cultures for growth and sensitivities.  Obtain sputum sample if possible.  IV fluid hydration  2.  Pneumonia probable left lower lobe: Community-acquired; chest x-ray read as scarring or atelectasis however the patient clinically has pneumonia.  Continue ceftriaxone and azithromycin. PRN Tessalon for cough. Monitor clinically and follow-up on cultures  3.  UTI documented on presentation; ruled out Clinically no evidence of UTI.  Urinalysis unremarkable. Patient already on empiric antibiotics for treatment of  pneumonia as mentioned above.  4.  Atrial fibrillation: Initially with rapid ventricular rate.  Now more controlled.  Continue metoprolol.  Patient already on anticoagulation with  Pradaxa  5.  Hypertension: Acceptable for age; continue lisinopril    DVT prophylaxis: Pradaxa  All the records are reviewed and case discussed with Care Management/Social Worker. Management plans discussed with the patient, family and they are in agreement.  CODE STATUS: Full Code  TOTAL TIME TAKING CARE OF THIS PATIENT: 34 minutes.   More than 50% of the time was spent in counseling/coordination of care: YES  POSSIBLE D/C IN 2 DAYS, DEPENDING ON CLINICAL CONDITION.   Evely Gainey M.D on 03/21/2018 at 2:51 PM  Between 7am to 6pm - Pager - [phone]  After 6pm go to [email or web address] - Proofreader  [facility]  Office  [phone]  CC: Primary care physician; System, Pcp Not In  Note: This dictation was prepared with Dragon dictation along with smaller phrase technology. Any transcriptional errors that result from this process are unintentional.

## 2018-03-21 NOTE — ED Notes (Signed)
Pt sleeping. Will hold zithromax and tessalon until patient wakes.

## 2018-03-21 NOTE — ED Notes (Signed)
Introduced self to patient. Family remains at bedside. Patient assisted to commode at this time.

## 2018-03-21 NOTE — ED Notes (Signed)
Pt up to bathroom with assistance 

## 2018-03-21 NOTE — ED Notes (Signed)
Pt had bowel movement in diaper. Pt cleaned, peri care provided. Diaper and linens replaced. Pt resting comfortably.

## 2018-03-22 ENCOUNTER — Other Ambulatory Visit: Payer: Self-pay

## 2018-03-22 LAB — CBC
HCT: 38.7 % (ref 36.0–46.0)
Hemoglobin: 13.3 g/dL (ref 12.0–15.0)
MCH: 31.3 pg (ref 26.0–34.0)
MCHC: 34.4 g/dL (ref 30.0–36.0)
MCV: 91.1 fL (ref 80.0–100.0)
NRBC: 0 % (ref 0.0–0.2)
Platelets: 266 10*3/uL (ref 150–400)
RBC: 4.25 MIL/uL (ref 3.87–5.11)
RDW: 13.2 % (ref 11.5–15.5)
WBC: 11.9 10*3/uL — ABNORMAL HIGH (ref 4.0–10.5)

## 2018-03-22 LAB — BASIC METABOLIC PANEL
Anion gap: 11 (ref 5–15)
BUN: 13 mg/dL (ref 8–23)
CO2: 22 mmol/L (ref 22–32)
Calcium: 8.5 mg/dL — ABNORMAL LOW (ref 8.9–10.3)
Chloride: 96 mmol/L — ABNORMAL LOW (ref 98–111)
Creatinine, Ser: 0.5 mg/dL (ref 0.44–1.00)
GFR calc Af Amer: >60 mL/min (ref >60)
GFR calc non Af Amer: >60 mL/min (ref >60)
Glucose, Bld: 118 mg/dL — ABNORMAL HIGH (ref 70–99)
Potassium: 3.5 mmol/L (ref 3.5–5.1)
Sodium: 129 mmol/L — ABNORMAL LOW (ref 135–145)

## 2018-03-22 LAB — MAGNESIUM: Magnesium: 1.9 mg/dL (ref 1.7–2.4)

## 2018-03-22 LAB — PHOSPHORUS: Phosphorus: 2.3 mg/dL — ABNORMAL LOW (ref 2.5–4.6)

## 2018-03-22 MED ORDER — METOPROLOL SUCCINATE ER 50 MG PO TB24: 75.0000 mg | ORAL_TABLET | Freq: Every day | ORAL | Status: DC

## 2018-03-22 MED ORDER — K PHOS MONO-SOD PHOS DI & MONO 155-852-130 MG PO TABS
500.0000 mg | ORAL_TABLET | ORAL | Status: AC
  Administered 2018-03-22: 500 mg via ORAL
  Filled 2018-03-22 (×2): qty 2

## 2018-03-22 MED ORDER — GUAIFENESIN-DM 100-10 MG/5ML PO SYRP
10.0000 mL | ORAL_SOLUTION | ORAL | Status: DC | PRN
  Administered 2018-03-22 – 2018-03-24 (×6): 10 mL via ORAL
  Filled 2018-03-22 (×8): qty 10

## 2018-03-22 MED ORDER — ALBUTEROL SULFATE (2.5 MG/3ML) 0.083% IN NEBU
2.5000 mg | INHALATION_SOLUTION | RESPIRATORY_TRACT | Status: DC | PRN
  Administered 2018-03-22 – 2018-03-26 (×9): 2.5 mg via RESPIRATORY_TRACT
  Filled 2018-03-22 (×9): qty 3

## 2018-03-22 MED ORDER — METOPROLOL SUCCINATE ER 25 MG PO TB24
25.0000 mg | ORAL_TABLET | Freq: Once | ORAL | Status: AC
  Administered 2018-03-22: 25 mg via ORAL
  Filled 2018-03-22: qty 1

## 2018-03-22 NOTE — Progress Notes (Signed)
[place] at [place] NAME: Tracy Singh    MR#:  [id number]  DATE OF BIRTH:  03/26/1924  SUBJECTIVE:  CHIEF COMPLAINT:   Chief Complaint  Patient presents with  . Shortness of Breath  . Cough   No new complaint.  Patient gradually feeling better.  Still having intermittent cough.  Placed on PRN Tessalon this morning. REVIEW OF SYSTEMS:  Review of Systems  Constitutional: Negative for chills.       Fevers at home  HENT: Negative for hearing loss and tinnitus.   Eyes: Negative for blurred vision and double vision.  Respiratory: Positive for cough and shortness of breath.   Cardiovascular: Negative for chest pain, palpitations and orthopnea.  Gastrointestinal: Negative for heartburn and nausea.  Genitourinary: Negative for dysuria and urgency.  Musculoskeletal: Negative for myalgias and neck pain.  Skin: Negative for itching and rash.  Neurological: Negative for dizziness, tingling and headaches.  Psychiatric/Behavioral: Negative for depression and suicidal ideas.    DRUG ALLERGIES:  Not on File VITALS:  Blood pressure (!) 144/90, pulse (!) 118, temperature 98.3 F (36.8 C), temperature source Oral, resp. rate 18, height '5\' 2"'  (1.575 m), weight 61.3 kg, SpO2 96 %. PHYSICAL EXAMINATION:    Physical Exam  Constitutional: She is oriented to person, place, and time. She appears well-developed and well-nourished.  HENT:  Head: Normocephalic and atraumatic.  Eyes: Pupils are equal, round, and reactive to light. Conjunctivae are normal. Right eye exhibits no discharge.  Neck: Normal range of motion. Neck supple. No tracheal deviation present.  Cardiovascular: Normal rate, regular rhythm and normal heart sounds.  Respiratory: Effort normal. She has rales.  GI: Soft. Bowel sounds are normal. There is no abdominal tenderness.  Musculoskeletal: Normal range of motion.        General: No edema.  Neurological: She is alert and  oriented to person, place, and time. No cranial nerve deficit.  Skin: Skin is warm. She is not diaphoretic. No erythema.  Psychiatric: She has a normal mood and affect. Her behavior is normal.   LABORATORY PANEL:  Female CBC Recent Labs  Lab 03/22/18 0610  WBC 11.9*  HGB 13.3  HCT 38.7  PLT 266   ------------------------------------------------------------------------------------------------------------------ Chemistries  Recent Labs  Lab 03/22/18 0610  NA 129*  K 3.5  CL 96*  CO2 22  GLUCOSE 118*  BUN 13  CREATININE 0.50  CALCIUM 8.5*  MG 1.9   RADIOLOGY:  No results found. ASSESSMENT AND PLAN:   This is a 85 year old female admitted for sepsis.  1.  Sepsis secondary to pneumonia which was diagnosed clinically.:  Patient met criteria via tachycardia, tachypnea and leukocytosis.  She is hemodynamically stable.  Follow blood cultures for growth and sensitivities.  Obtain sputum sample if possible.  IV fluid hydration  2.  Pneumonia probable left lower lobe: Community-acquired; chest x-ray read as scarring or atelectasis however the patient clinically has pneumonia.  Continue ceftriaxone and azithromycin. PRN Tessalon for cough.  PRN albuterol nebulizer treatment Monitor clinically and follow-up on cultures  3.  UTI documented on presentation; ruled out Clinically no evidence of UTI.  Urinalysis unremarkable. Patient already on empiric antibiotics for treatment of pneumonia as mentioned above.  4.  Atrial fibrillation: Initially with rapid ventricular rate.  Now more controlled.  Continue metoprolol.  Patient already on anticoagulation with Pradaxa  5.  Hypertension: Acceptable for age; continue lisinopril    DVT prophylaxis: Pradaxa Physical therapist to evaluate and treat  All the records are reviewed and case discussed with Care Management/Social Worker. Management plans discussed with the patient, family and they are in agreement.  CODE STATUS: Full  Code  TOTAL TIME TAKING CARE OF THIS PATIENT: 27 minutes.   More than 50% of the time was spent in counseling/coordination of care: YES  POSSIBLE D/C IN 1-2 DAYS, DEPENDING ON CLINICAL CONDITION.   Yarrow Linhart M.D on 03/22/2018 at 2:05 PM  Between 7am to 6pm - Pager - [phone]  After 6pm go to [email or web address] - Proofreader  [facility]  Office  [phone]  CC: Primary care physician; System, Pcp Not In  Note: This dictation was prepared with Dragon dictation along with smaller phrase technology. Any transcriptional errors that result from this process are unintentional.

## 2018-03-22 NOTE — Consult Note (Signed)
[facility] Cardiology  CARDIOLOGY CONSULT NOTE  Patient ID: Tracy Singh MRN: [id number] DOB/AGE: 08-12-1924 85 y.o.  Admit date: 03/20/2018 Referring Physician Enid Baas Primary Physician [facility] Primary Cardiologist Fath Reason for Consultation atrial fibrillation  HPI: 85 year old female referred for evaluation of atrial fibrillation.  The patient was admitted on 03/20/2018 with fever, chills, active cough and shortness of breath, consistent with community acquired pneumonia.  On admission, patient was noted to be tachycardic, with atrial fibrillation with rapid ventricular rate.  The patient has known history of chronic atrial fibrillation, followed by Dr. Lady Gary, on Pradaxa for stroke prevention.  Patient asymptomatic with regard to atrial fibrillation, denies palpitations, heart racing or chest pain.  Patient reports she is feeling much better today.  Telemetry reveals atrial fibrillation at a rate of 110 to 120 bpm.  Review of systems complete and found to be negative unless listed above     Past Medical History:  Diagnosis Date  . A-fib (HCC)   . Von Willebrand disease (HCC)     Past Surgical History:  Procedure Laterality Date  . ABDOMINAL HYSTERECTOMY      Medications Prior to Admission  Medication Sig Dispense Refill Last Dose  . lisinopril (PRINIVIL,ZESTRIL) 5 MG tablet Take 5 mg by mouth daily.   03/20/2018 at 1200  . metoprolol succinate (TOPROL-XL) 50 MG 24 hr tablet Take 50 mg by mouth daily.   03/20/2018 at 1200  . Multiple Vitamin (MULTIVITAMIN) capsule Take 1 capsule by mouth daily.   03/20/2018 at 0700  . PRADAXA 150 MG CAPS capsule Take 150 mg by mouth. bid   03/20/2018 at 1730   Social History   Socioeconomic History  . Marital status: Divorced    Spouse name: Not on file  . Number of children: Not on file  . Years of education: Not on file  . Highest education level: Not on file  Occupational History  . Not on file  Social Needs  . Financial resource strain:  Not on file  . Food insecurity:    Worry: Not on file    Inability: Not on file  . Transportation needs:    Medical: Not on file    Non-medical: Not on file  Tobacco Use  . Smoking status: Never Smoker  . Smokeless tobacco: Never Used  Substance and Sexual Activity  . Alcohol use: Not on file  . Drug use: Not on file  . Sexual activity: Not on file  Lifestyle  . Physical activity:    Days per week: Not on file    Minutes per session: Not on file  . Stress: Not on file  Relationships  . Social connections:    Talks on phone: Not on file    Gets together: Not on file    Attends religious service: Not on file    Active member of club or organization: Not on file    Attends meetings of clubs or organizations: Not on file    Relationship status: Not on file  . Intimate partner violence:    Fear of current or ex partner: Not on file    Emotionally abused: Not on file    Physically abused: Not on file    Forced sexual activity: Not on file  Other Topics Concern  . Not on file  Social History Narrative  . Not on file    Family History  Problem Relation Age of Onset  . Cancer Father   . Cancer Brother  Review of systems complete and found to be negative unless listed above      PHYSICAL EXAM  General: Well developed, well nourished, in no acute distress HEENT:  Normocephalic and atramatic Neck:  No JVD.  Lungs: Clear bilaterally to auscultation and percussion. Heart: HRRR . Normal S1 and S2 without gallops or murmurs.  Abdomen: Bowel sounds are positive, abdomen soft and non-tender  Msk:  Back normal, normal gait. Normal strength and tone for age. Extremities: No clubbing, cyanosis or edema.   Neuro: Alert and oriented X 3. Psych:  Good affect, responds appropriately  Labs:   Lab Results  Component Value Date   WBC 11.9 (H) 03/22/2018   HGB 13.3 03/22/2018   HCT 38.7 03/22/2018   MCV 91.1 03/22/2018   PLT 266 03/22/2018    Recent Labs  Lab  03/22/18 0610  NA 129*  K 3.5  CL 96*  CO2 22  BUN 13  CREATININE 0.50  CALCIUM 8.5*  GLUCOSE 118*   Lab Results  Component Value Date   TROPONINI <0.03 03/20/2018   No results found for: CHOL No results found for: HDL No results found for: LDLCALC No results found for: TRIG No results found for: CHOLHDL No results found for: LDLDIRECT    Radiology: Dg Chest Portable 1 View  Result Date: 03/20/2018 CLINICAL DATA:  Productive cough EXAM: PORTABLE CHEST 1 VIEW COMPARISON:  01/27/2010 FINDINGS: Cardiomegaly. Left basilar airspace opacity noted, similar prior study, likely scarring. Right lung clear. No effusions or acute bony abnormality. IMPRESSION: Cardiomegaly.  Left basilar scarring.  No active disease. Electronically Signed   By: Charlett Nose M.D.   On: 03/20/2018 21:34    EKG: Atrial fibrillation with rapid ventricular rate  ASSESSMENT AND PLAN:   1.  Atrial fibrillation with rapid ventricular rate, compensatory for pneumonia, asymptomatic, on Pradaxa for stroke prevention 2.  Community-acquired pneumonia, improved on antibiotics  Recommendations  1.  Agree with current therapy 2.  Continue Pradaxa for stroke prevention 3.  Defer further cardiac diagnostics at this time  Signed: Marcina Millard MD,PhD, [facility] 03/22/2018, 5:37 PM

## 2018-03-23 LAB — CBC
HCT: 34.9 % — ABNORMAL LOW (ref 36.0–46.0)
Hemoglobin: 12.3 g/dL (ref 12.0–15.0)
MCH: 32.3 pg (ref 26.0–34.0)
MCHC: 35.2 g/dL (ref 30.0–36.0)
MCV: 91.6 fL (ref 80.0–100.0)
Platelets: 257 10*3/uL (ref 150–400)
RBC: 3.81 MIL/uL — ABNORMAL LOW (ref 3.87–5.11)
RDW: 13.1 % (ref 11.5–15.5)
WBC: 9.9 10*3/uL (ref 4.0–10.5)
nRBC: 0 % (ref 0.0–0.2)

## 2018-03-23 LAB — MAGNESIUM: Magnesium: 1.8 mg/dL (ref 1.7–2.4)

## 2018-03-23 LAB — BASIC METABOLIC PANEL
Anion gap: 10 (ref 5–15)
BUN: 9 mg/dL (ref 8–23)
CO2: 23 mmol/L (ref 22–32)
Calcium: 8.4 mg/dL — ABNORMAL LOW (ref 8.9–10.3)
Chloride: 97 mmol/L — ABNORMAL LOW (ref 98–111)
Creatinine, Ser: 0.44 mg/dL (ref 0.44–1.00)
GFR calc Af Amer: >60 mL/min (ref >60)
GFR calc non Af Amer: >60 mL/min (ref >60)
GLUCOSE: 137 mg/dL — AB (ref 70–99)
Potassium: 3.4 mmol/L — ABNORMAL LOW (ref 3.5–5.1)
Sodium: 130 mmol/L — ABNORMAL LOW (ref 135–145)

## 2018-03-23 LAB — EXPECTORATED SPUTUM ASSESSMENT W GRAM STAIN, RFLX TO RESP C

## 2018-03-23 LAB — PHOSPHORUS: Phosphorus: 2.9 mg/dL (ref 2.5–4.6)

## 2018-03-23 MED ORDER — PANTOPRAZOLE SODIUM 40 MG PO TBEC
40.0000 mg | DELAYED_RELEASE_TABLET | Freq: Every day | ORAL | Status: DC
  Administered 2018-03-23 – 2018-03-25 (×3): 40 mg via ORAL
  Filled 2018-03-23 (×3): qty 1

## 2018-03-23 MED ORDER — POTASSIUM CHLORIDE CRYS ER 20 MEQ PO TBCR
40.0000 meq | EXTENDED_RELEASE_TABLET | Freq: Once | ORAL | Status: AC
  Administered 2018-03-23: 40 meq via ORAL
  Filled 2018-03-23: qty 2

## 2018-03-23 MED ORDER — METOPROLOL SUCCINATE ER 50 MG PO TB24
75.0000 mg | ORAL_TABLET | Freq: Every day | ORAL | Status: DC
  Administered 2018-03-23: 75 mg via ORAL
  Filled 2018-03-23 (×2): qty 1

## 2018-03-23 MED ORDER — DILTIAZEM HCL 25 MG/5ML IV SOLN
5.0000 mg | Freq: Once | INTRAVENOUS | Status: AC
  Administered 2018-03-23: 5 mg via INTRAVENOUS
  Filled 2018-03-23: qty 5

## 2018-03-23 MED ORDER — GUAIFENESIN-CODEINE 100-10 MG/5ML PO SOLN: 5.0000 mL | Freq: Four times a day (QID) | ORAL | Status: DC | PRN

## 2018-03-23 MED ORDER — METOPROLOL SUCCINATE ER 25 MG PO TB24
25.0000 mg | ORAL_TABLET | Freq: Once | ORAL | Status: AC
  Administered 2018-03-23: 25 mg via ORAL
  Filled 2018-03-23: qty 1

## 2018-03-23 MED ORDER — ALUM & MAG HYDROXIDE-SIMETH 200-200-20 MG/5ML PO SUSP: 15.0000 mL | ORAL | Status: DC | PRN

## 2018-03-23 MED ORDER — GUAIFENESIN-CODEINE 100-10 MG/5ML PO SOLN
5.0000 mL | Freq: Every evening | ORAL | Status: DC | PRN
  Administered 2018-03-24 – 2018-03-25 (×3): 5 mL via ORAL
  Filled 2018-03-23 (×3): qty 5

## 2018-03-23 MED ORDER — METOPROLOL SUCCINATE ER 100 MG PO TB24
100.0000 mg | ORAL_TABLET | Freq: Every day | ORAL | Status: DC
  Administered 2018-03-24 – 2018-03-26 (×3): 100 mg via ORAL
  Filled 2018-03-23 (×3): qty 1

## 2018-03-23 NOTE — Care Management Note (Signed)
Case Management Note  Patient Details  Name: MAYLEEN POWDERLY MRN: [id number] Date of Birth: Jun 07, 1924  Subjective/Objective:           Patient is independent from home.  Daughter lives on 1st floor and patient lives on 2nd floor.     She denies difficulties obtaining medications or with accessing medical care.  She goes to the gym on a regular bases; Silver Sneakers and water aerobics.  She is current with her PCP.  This RNCM offered HH services and patient declines.  She is ambulating without difficulty.  No further needs identified at this time.        Action/Plan:   Expected Discharge Date:                  Expected Discharge Plan:  Home/Self Care  In-House Referral:     Discharge planning Services  CM Consult  Post Acute Care Choice:    Choice offered to:     DME Arranged:    DME Agency:     HH Arranged:  Patient Refused HH HH Agency:     Status of Service:  Completed, signed off  If discussed at [employer] of Stay Meetings, dates discussed:    Additional Comments:  [name], RN 03/23/2018, 5:22 PM

## 2018-03-23 NOTE — Progress Notes (Signed)
[place] at [place] NAME: Tracy Singh    MR#:  [id number]  DATE OF BIRTH:  23-Aug-1924  SUBJECTIVE:  CHIEF COMPLAINT:   Chief Complaint  Patient presents with  . Shortness of Breath  . Cough   Having some intermittent epigastric discomfort with coughing with concern for heartburn.  Patient gradually feeling better.  Still having intermittent cough with shortness of breath.  Family reported some improvement with PRN breathing treatments. Intermittent episodes of A. fib with rapid ventricular response.  Seen by cardiology service.  REVIEW OF SYSTEMS:  Review of Systems  Constitutional: Negative for chills.       Fevers at home  HENT: Negative for hearing loss and tinnitus.   Eyes: Negative for blurred vision and double vision.  Respiratory: Positive for cough and shortness of breath.   Cardiovascular: Negative for chest pain, palpitations and orthopnea.  Gastrointestinal: Negative for heartburn and nausea.  Genitourinary: Negative for dysuria and urgency.  Musculoskeletal: Negative for myalgias and neck pain.  Skin: Negative for itching and rash.  Neurological: Negative for dizziness, tingling and headaches.  Psychiatric/Behavioral: Negative for depression and suicidal ideas.    DRUG ALLERGIES:  Not on File VITALS:  Blood pressure 119/71, pulse 100, temperature 98.1 F (36.7 C), temperature source Oral, resp. rate 18, height _0  (1.575 m), weight 65.4 kg, SpO2 96 %. PHYSICAL EXAMINATION:    Physical Exam  Constitutional: She is oriented to person, place, and time. She appears well-developed and well-nourished.  HENT:  Head: Normocephalic and atraumatic.  Eyes: Pupils are equal, round, and reactive to light. Conjunctivae are normal. Right eye exhibits no discharge.  Neck: Normal range of motion. Neck supple. No tracheal deviation present.  Cardiovascular: Normal rate, regular rhythm and normal heart sounds.    Respiratory: Effort normal. She has rales.  GI: Soft. Bowel sounds are normal. There is no abdominal tenderness.  Musculoskeletal: Normal range of motion.        General: No edema.  Neurological: She is alert and oriented to person, place, and time. No cranial nerve deficit.  Skin: Skin is warm. She is not diaphoretic. No erythema.  Psychiatric: She has a normal mood and affect. Her behavior is normal.   LABORATORY PANEL:  Female CBC Recent Labs  Lab 03/23/18 0430  WBC 9.9  HGB 12.3  HCT 34.9*  PLT 257   ------------------------------------------------------------------------------------------------------------------ Chemistries  Recent Labs  Lab 03/23/18 0430  NA 130*  K 3.4*  CL 97*  CO2 23  GLUCOSE 137*  BUN 9  CREATININE 0.44  CALCIUM 8.4*  MG 1.8   RADIOLOGY:  No results found. ASSESSMENT AND PLAN:   This is a 85 year old female admitted for sepsis.  1.  Sepsis secondary to pneumonia which was diagnosed clinically.:  Patient met criteria via tachycardia, tachypnea and leukocytosis.  She is hemodynamically stable.  Follow blood cultures for growth and sensitivities.  Follow-up on sputum culture and sensitivities.  IV fluid hydration  2.  Pneumonia probable left lower lobe: Community-acquired; chest x-ray read as scarring or atelectasis however the patient clinically has pneumonia.  Continue ceftriaxone and azithromycin. PRN Tessalon for cough.  PRN albuterol nebulizer treatment Monitor clinically and follow-up on cultures  3.  UTI documented on presentation; ruled out Clinically no evidence of UTI.  Urinalysis unremarkable. Patient already on empiric antibiotics for treatment of pneumonia as mentioned above.  4.  Atrial fibrillation: Intermittent episodes of A. fib with rapid ventricular response.  Increased  dose of metoprolol XL 200 mg p.o. daily today.  Patient already on anticoagulation with Pradaxa.  Seen by cardiologist.  Appreciate input  5.   Hypertension: Acceptable for age; continue lisinopril    DVT prophylaxis: Pradaxa Patient seen by physical therapist.  Will need home health with physical therapy and intermittent supervision at home on discharge   All the records are reviewed and case discussed with Care Management/Social Worker. Management plans discussed with the patient, family and they are in agreement.  CODE STATUS: Full Code  TOTAL TIME TAKING CARE OF THIS PATIENT: 27 minutes.   More than 50% of the time was spent in counseling/coordination of care: YES  POSSIBLE D/C IN 1-2 DAYS, DEPENDING ON CLINICAL CONDITION.   Eleni Frank M.D on 03/23/2018 at 4:07 PM  Between 7am to 6pm - Pager - [phone]  After 6pm go to [email or web address] - Proofreader  [facility]  Office  [phone]  CC: Primary care physician; System, Pcp Not In  Note: This dictation was prepared with Dragon dictation along with smaller phrase technology. Any transcriptional errors that result from this process are unintentional.

## 2018-03-23 NOTE — Plan of Care (Signed)
  Problem: Clinical Measurements: Goal: Respiratory complications will improve Outcome: Progressing   Problem: Elimination: Goal: Will not experience complications related to urinary retention Outcome: Progressing   Problem: Respiratory: Goal: Ability to maintain adequate ventilation will improve Outcome: Progressing

## 2018-03-23 NOTE — Progress Notes (Signed)
Patient's daughter refusing [place] to put chair alarm on patient. Daughter stated she will help patient and does not want the chair alarm on. Educated on why we need the chair alarm on but daughter refused. She stated she would let us know if she stepped out so we could turn it on then. Patient transferred from bed to bsc with one assist. Will continue to monitor patient.

## 2018-03-23 NOTE — Progress Notes (Signed)
Patient and patients family requesting to discontinue colace,stool softener, because patient has had multiple bowel movents. Dr. Enid Baas notified, verbal order to discontinue med.  Patient has also been refusing tessalon for cough, per daughter they want to hold off on some doses because this medication does not seem to be helping. MD notified, oncoming nurse will be notified.

## 2018-03-23 NOTE — Progress Notes (Signed)
Patient's heart rate in the 120s-130s while sitting up in the recliner. Patient's daughter stated patient had been coughing. Patients heart rate still elevated at 128 right now. Dr. Enid Baas notified, verbal orders with read back to administer Diltiazem 5mg  IV once now and continue to monitor patient.

## 2018-03-23 NOTE — Evaluation (Signed)
Physical Therapy Evaluation Patient Details Name: Tracy Singh MRN: [id number] DOB: [birth year] Today's Date: 03/23/2018   History of Present Illness  85 yo female admitted 03/20/2018 with fever, chills, cough, SOB, tachycardia. PMH includes atrial fibrillation, Von Willebrand disease     Clinical Impression  Patient with family at bedside, alert, oriented, behavior WFLs, HOH, able to follow all commands. Reported living in an apartment above daughter with own kitchen entrance, previously independent in ADLs, ambulated without AD, does not drive. Attends gym class 3x a week.   Patient HR monitored throughout mobility, HR in 140's with exertion during ambulation. Pt instructed in standing rest breaks, HR returned to 120s, but did elevated multiple times, RN notified. Patient ambulated bilateral UE pushing IV pole, improved safety and gait quality noted, and pt educated about potential benefit and safety of use of RW. Pt agreeable to trial next time she walked.  Overall the patient demonstrated deficits (see "PT Problem List") that impede the patient's functional abilities, safety, and mobility and would benefit from skilled PT intervention. Recommendation is HHPT with intermittent supervision.     Follow Up Recommendations Home health PT;Supervision - Intermittent    Equipment Recommendations  Rolling walker with 5" wheels    Recommendations for Other Services       Precautions / Restrictions Precautions Precautions: Fall Restrictions Weight Bearing Restrictions: No      Mobility  Bed Mobility               General bed mobility comments: Pt up in chair at this time  Transfers Overall transfer level: Modified independent               General transfer comment: use of arms, but overall steady  Ambulation/Gait Ambulation/Gait assistance: Min guard Gait Distance (Feet): 180 Feet Assistive device: IV Pole       General Gait Details: intermittent shuffling of  step, decreased gait velocity noted. Pt with improved step length and endurance with bilateral hand support on IV pole. Resistant to RW, but agreeable to attempt next walk.   Stairs            Wheelchair Mobility    Modified Rankin (Stroke Patients Only)       Balance Overall balance assessment: Mild deficits observed, not formally tested                                           Pertinent Vitals/Pain Pain Assessment: No/denies pain    Home Living Family/patient expects to be discharged to:: Private residence Living Arrangements: Children   Type of Home: Apartment Home Access: Stairs to enter Entrance Stairs-Rails: Right Entrance Stairs-Number of Steps: 11 or 13 Home Layout: One level Home Equipment: Grab bars - tub/shower      Prior Function Level of Independence: Independent         Comments: Pt independent with ADLs, walking (no AD), does not drive so needs assistance to grocery store, dr appts, gym. No falls in teh last 6 months and goes to the gym 3x a week. Lives in an apartment above daughter.      Hand Dominance        Extremity/Trunk Assessment        Lower Extremity Assessment Lower Extremity Assessment: RLE deficits/detail;LLE deficits/detail RLE Deficits / Details: grossly 4/5, hip flexion 4-/5 LLE Deficits / Details: grossly 4/5, hip flexion 4-/5  Communication   Communication: HOH  Cognition Arousal/Alertness: Awake/alert Behavior During Therapy: WFL for tasks assessed/performed Overall Cognitive Status: Within Functional Limits for tasks assessed                                        General Comments      Exercises     Assessment/Plan    PT Assessment Patient needs continued PT services  PT Problem List Decreased strength;Cardiopulmonary status limiting activity;Decreased activity tolerance;Decreased mobility;Decreased balance;Decreased safety awareness;Decreased knowledge of use of  DME       PT Treatment Interventions DME instruction;Gait training;Therapeutic exercise;Patient/family education;Stair training;Balance training;Functional mobility training;Neuromuscular re-education    PT Goals (Current goals can be found in the Care Plan section)  Acute Rehab PT Goals Patient Stated Goal: Patient wants to return to PLOF PT Goal Formulation: With patient Time For Goal Achievement: 04/06/18 Potential to Achieve Goals: Good    Frequency Min 2X/week   Barriers to discharge        Co-evaluation               AM-PAC PT "6 Clicks" Mobility  Outcome Measure Help needed turning from your back to your side while in a flat bed without using bedrails?: None Help needed moving from lying on your back to sitting on the side of a flat bed without using bedrails?: None Help needed moving to and from a bed to a chair (including a wheelchair)?: None Help needed standing up from a chair using your arms (e.g., wheelchair or bedside chair)?: None Help needed to walk in hospital room?: A Little Help needed climbing 3-5 steps with a railing? : A Little 6 Click Score: 22    End of Session Equipment Utilized During Treatment: Gait belt Activity Tolerance: Patient tolerated treatment well;Other (comment)(HR in 140s with ambulation, standing rest breaks enforced with HR returning to 120s ea time. ) Patient left: in chair;with family/visitor present;with call bell/phone within reach Nurse Communication: Mobility status;Other (comment)(HR) PT Visit Diagnosis: Other abnormalities of gait and mobility (R26.89);Muscle weakness (generalized) (M62.81);Difficulty in walking, not elsewhere classified (R26.2)    Time: 4401-0272 PT Time Calculation (min) (ACUTE ONLY): 34 min   Charges:   PT Evaluation $PT Eval Moderate Complexity: 1 Mod PT Treatments $Therapeutic Activity: 8-22 mins       Olga Coaster PT, DPT 10:20 AM,03/23/18 [phone]

## 2018-03-23 NOTE — Progress Notes (Signed)
Notified by CCMD of HR 130-140s.  Pt sitting up in recliner on arrival to room.  Daughter at bedside and reports patient "coughing for seven minutes now".  Pt reports shortness of breath.  Saturations at 93% on room air.   Oxygen at  [facility] applied.   Saturations improved to 95%.  Cough med offered as ordered.

## 2018-03-23 NOTE — Care Management Important Message (Signed)
Copy of signed Medicare IM left with patient in room. 

## 2018-03-24 LAB — BASIC METABOLIC PANEL
Anion gap: 7 (ref 5–15)
BUN: 6 mg/dL — ABNORMAL LOW (ref 8–23)
CO2: 25 mmol/L (ref 22–32)
Calcium: 8.4 mg/dL — ABNORMAL LOW (ref 8.9–10.3)
Chloride: 102 mmol/L (ref 98–111)
Creatinine, Ser: 0.44 mg/dL (ref 0.44–1.00)
Glucose, Bld: 111 mg/dL — ABNORMAL HIGH (ref 70–99)
Potassium: 3.7 mmol/L (ref 3.5–5.1)
Sodium: 134 mmol/L — ABNORMAL LOW (ref 135–145)

## 2018-03-24 LAB — MAGNESIUM: MAGNESIUM: 1.9 mg/dL (ref 1.7–2.4)

## 2018-03-24 MED ORDER — FUROSEMIDE 10 MG/ML IJ SOLN
40.0000 mg | Freq: Once | INTRAMUSCULAR | Status: AC
  Administered 2018-03-24: 40 mg via INTRAVENOUS
  Filled 2018-03-24: qty 4

## 2018-03-24 MED ORDER — DILTIAZEM HCL 30 MG PO TABS
30.0000 mg | ORAL_TABLET | Freq: Three times a day (TID) | ORAL | Status: DC
  Administered 2018-03-24 – 2018-03-25 (×4): 30 mg via ORAL
  Filled 2018-03-24 (×4): qty 1

## 2018-03-24 NOTE — Progress Notes (Addendum)
Pt's HR in the 130's-150's , pt asymptomatic, sitting up in the recliner. Dr. Sherryll Burger notified. Per MD will put orders for po carzidem. Will continue to monitor.

## 2018-03-24 NOTE — Progress Notes (Signed)
[place] [facility] [place] NAME: Tracy Singh    MR#:  [id number]  DATE OF BIRTH:  [birth year]  SUBJECTIVE:  CHIEF COMPLAINT:   Chief Complaint  Patient presents with  . Shortness of Breath  . Cough   Per RN patient's HR in the 130-150 range earlier this AM, asymptomatic sitting up in recliner. Cardizem 30 mg PO q 8hrs ordered.  No chest pain or palpitations. + cough + sputum + SOB Fluids d/c, eating and drinking with renal function improved. Concern from daughter at bedside about fluid overload.   REVIEW OF SYSTEMS:  Review of Systems  Constitutional: Negative for chills or fever.  HENT: Negative for hearing loss and tinnitus.   Eyes: Negative for blurred vision and double vision.  Respiratory: Positive for cough with sputum production (yellow) and shortness of breath. No hemoptysis.  Cardiovascular: Negative for chest pain, palpitations and orthopnea. Positive for lower extremity edema.  Gastrointestinal: Negative for heartburn and nausea.  Genitourinary: Negative for dysuria and urgency.  Musculoskeletal: Negative for myalgias and neck pain.  Skin: Negative for itching and rash.  Neurological: Negative for dizziness, tingling and headaches.  Psychiatric/Behavioral: Negative for depression and suicidal ideas.  DRUG ALLERGIES:  Not on File VITALS:  Blood pressure 134/67, pulse 86, temperature 97.7 F (36.5 C), temperature source Oral, resp. rate 18, height '5\' 2"'  (1.575 m), weight 67.5 kg, SpO2 90 %. PHYSICAL EXAMINATION:  Physical Exam  Constitutional: She is oriented to person, place, and time. She appears well-developed and well-nourished.  HENT:  Head: Normocephalic and atraumatic.  Eyes: Pupils are equal, round, and reactive to light. Conjunctivae are normal. Right eye exhibits no discharge.  Neck: Normal range of motion. Neck supple. No tracheal deviation present.  Cardiovascular: Normal rate, regular rhythm and normal heart  sounds.  Respiratory: Effort increased with conversation. Coughing on exam. She has diffuse coarse breath sounds. GI: Soft. Bowel sounds are normal. There is no abdominal tenderness.  Musculoskeletal: Normal range of motion.        General: 1 + pitting edema lower extremity Neurological: She is alert and oriented to person, place, and time. No cranial nerve deficit.  Skin: Skin is warm. She is not diaphoretic. No erythema.  Psychiatric: She has a normal mood and affect. Her behavior is normal.  LABORATORY PANEL:  Female CBC Recent Labs  Lab 03/23/18 0430  WBC 9.9  HGB 12.3  HCT 34.9*  PLT 257   ------------------------------------------------------------------------------------------------------------------ Chemistries  Recent Labs  Lab 03/24/18 0529  NA 134*  K 3.7  CL 102  CO2 25  GLUCOSE 111*  BUN 6*  CREATININE 0.44  CALCIUM 8.4*  MG 1.9   RADIOLOGY:  No results found. ASSESSMENT AND PLAN:   Tracy Singh is a 85 year old female with past medical history significant for von willebrand disease and atrial fibrillation admitted for sepsis secondary to pneumonia.   1. Sepsis secondary to pneumonia which was diagnosed clinically.:  Patient met criteria via tachycardia, tachypnea and leukocytosis. She is hemodynamically stable. Follow blood cultures for growth and sensitivities.  Follow-up on sputum culture and sensitivities.  IV fluid hydration initially, stopped today. Renal function stable and PO intake stable.   2. Pneumonia probable left lower lobe: Community-acquired; chest x-ray read as scarring or atelectasis however the patient clinically has pneumonia. Continue ceftriaxone and azithromycin. PRN Tessalon for cough.  PRN albuterol nebulizer treatment. Incentive spirometer use encouraged. Monitor clinically and follow-up on cultures  3. Fluid overload iatrogenic secondary  to sepsis protocol. One time dose of Lasix 40 mg IV given today. Monitor for  improvement and will monitor renal function AM labs.   4. Atrial fibrillation: Intermittent episodes of A. fib with rapid ventricular response.  Increased dose of metoprolol XL 200 mg p.o. daily yesterday. Patient already on anticoagulation with Pradaxa. Cardizem 50 mg TID ordered today. Monitor. Seen by cardiologist/Dr. Saralyn Pilar. Appreciate input.  5. Hypertension: Acceptable for age; continue lisinopril  6. UTI documented on presentation; ruled out Clinically no evidence of UTI.  Urinalysis unremarkable. Patient already on empiric antibiotics for treatment of pneumonia as mentioned above.   DVT prophylaxis: Pradaxa  Disposition: Patient seen by physical therapist. Will need home health with physical therapy and intermittent supervision at home on discharge. Is very physically active at baseline.   All the records are reviewed and case is discussed with Care Management/Social Worker. Management plans discussed with the patient and/or family and they are in agreement.  CODE STATUS: Full Code  TOTAL TIME TAKING CARE OF THIS PATIENT: 30 minutes.   More than 50% of the time was spent in counseling/coordination of care: YES  POSSIBLE D/C IN 1-2 DAYS, DEPENDING ON CLINICAL CONDITION.  Dhillon Comunale PA-C on 03/24/2018 at 3:11 PM  Between 7am to 6pm - Pager - [phone]  After 6 pm go to [email or web address] - Proofreader  [facility]  Office  [phone]  CC: Primary care physician; System, Pcp Not In  Note: This dictation was prepared with Dragon dictation along with smaller phrase technology. Any transcriptional errors that result from this process are unintentional.

## 2018-03-24 NOTE — Progress Notes (Signed)
[facility]  SUBJECTIVE: Patient sitting in chair, denies chest pain or palpitations   Vitals:   03/23/18 2011 03/24/18 0349 03/24/18 0735 03/24/18 0912  BP: 139/77 123/87 (!) 149/99 (!) 147/78  Pulse: (!) 108 (!) 110 (!) 128 (!) 124  Resp: 20 (!) 24    Temp: (!) 100.4 F (38 C) 97.8 F (36.6 C) 98 F (36.7 C)   TempSrc: Oral Oral Oral   SpO2: 93% 97% 94%   Weight:  67.5 kg    Height:         Intake/Output Summary (Last 24 hours) at 03/24/2018 1113 Last data filed at 03/24/2018 0740 Gross per 24 hour  Intake 1700 ml  Output 2985 ml  Net -1285 ml      PHYSICAL EXAM  General: Well developed, well nourished, in no acute distress HEENT:  Normocephalic and atramatic Neck:  No JVD.  Lungs: Clear bilaterally to auscultation and percussion. Heart: HRRR . Normal S1 and S2 without gallops or murmurs.  Abdomen: Bowel sounds are positive, abdomen soft and non-tender  Msk:  Back normal, normal gait. Normal strength and tone for age. Extremities: No clubbing, cyanosis or edema.   Neuro: Alert and oriented X 3. Psych:  Good affect, responds appropriately   LABS: Basic Metabolic Panel: Recent Labs    03/22/18 0610 03/23/18 0430 03/24/18 0529  NA 129* 130* 134*  K 3.5 3.4* 3.7  CL 96* 97* 102  CO2 22 23 25   GLUCOSE 118* 137* 111*  BUN 13 9 6*  CREATININE 0.50 0.44 0.44  CALCIUM 8.5* 8.4* 8.4*  MG 1.9 1.8 1.9  PHOS 2.3* 2.9  --    Liver Function Tests: No results for input(s): AST, ALT, ALKPHOS, BILITOT, PROT, ALBUMIN in the last 72 hours. No results for input(s): LIPASE, AMYLASE in the last 72 hours. CBC: Recent Labs    03/22/18 0610 03/23/18 0430  WBC 11.9* 9.9  HGB 13.3 12.3  HCT 38.7 34.9*  MCV 91.1 91.6  PLT 266 257   Cardiac Enzymes: No results for input(s): CKTOTAL, CKMB, CKMBINDEX, TROPONINI in the last 72 hours. BNP: Invalid input(s): POCBNP D-Dimer: No results for input(s): DDIMER in the last 72 hours. Hemoglobin A1C: No results for  input(s): HGBA1C in the last 72 hours. Fasting Lipid Panel: No results for input(s): CHOL, HDL, LDLCALC, TRIG, CHOLHDL, LDLDIRECT in the last 72 hours. Thyroid Function Tests: No results for input(s): TSH, T4TOTAL, T3FREE, THYROIDAB in the last 72 hours.  Invalid input(s): FREET3 Anemia Panel: No results for input(s): VITAMINB12, FOLATE, FERRITIN, TIBC, IRON, RETICCTPCT in the last 72 hours.  No results found.   Echo   TELEMETRY: Atrial fibrillation at a rate of 110-120:  ASSESSMENT AND PLAN:  Active Problems:   Sepsis (HCC)    1.  Atrial fibrillation with rapid ventricular rate, asymptomatic, compensatory for pneumonia, on Pradaxa for stroke prevention 2.  Community-acquired pneumonia  Recommendations  1.  Agree with current therapy 2.  Continue Pradaxa for stroke prevention 3.  Defer further cardiac diagnostics at this time  Sign off for now, please call if any questions   Marcina Millard, MD, PhD, [facility] 03/24/2018 11:13 AM

## 2018-03-25 LAB — CULTURE, BLOOD (ROUTINE X 2)
Culture: NO GROWTH
Culture: NO GROWTH

## 2018-03-25 LAB — MAGNESIUM: Magnesium: 1.9 mg/dL (ref 1.7–2.4)

## 2018-03-25 LAB — BASIC METABOLIC PANEL
Anion gap: 7 (ref 5–15)
BUN: 7 mg/dL — ABNORMAL LOW (ref 8–23)
CO2: 26 mmol/L (ref 22–32)
Calcium: 8.3 mg/dL — ABNORMAL LOW (ref 8.9–10.3)
Chloride: 97 mmol/L — ABNORMAL LOW (ref 98–111)
Creatinine, Ser: 0.48 mg/dL (ref 0.44–1.00)
GFR calc Af Amer: >60 mL/min (ref >60)
GFR calc non Af Amer: >60 mL/min (ref >60)
GLUCOSE: 113 mg/dL — AB (ref 70–99)
Potassium: 3.5 mmol/L (ref 3.5–5.1)
Sodium: 130 mmol/L — ABNORMAL LOW (ref 135–145)

## 2018-03-25 LAB — CBC
HCT: 34.2 % — ABNORMAL LOW (ref 36.0–46.0)
Hemoglobin: 12.1 g/dL (ref 12.0–15.0)
MCH: 32.2 pg (ref 26.0–34.0)
MCHC: 35.4 g/dL (ref 30.0–36.0)
MCV: 91 fL (ref 80.0–100.0)
PLATELETS: 302 10*3/uL (ref 150–400)
RBC: 3.76 MIL/uL — ABNORMAL LOW (ref 3.87–5.11)
RDW: 13.1 % (ref 11.5–15.5)
WBC: 9.6 10*3/uL (ref 4.0–10.5)
nRBC: 0 % (ref 0.0–0.2)

## 2018-03-25 MED ORDER — DILTIAZEM HCL 30 MG PO TABS
30.0000 mg | ORAL_TABLET | Freq: Four times a day (QID) | ORAL | Status: DC
  Administered 2018-03-25 – 2018-03-26 (×5): 30 mg via ORAL
  Filled 2018-03-25 (×5): qty 1

## 2018-03-25 MED ORDER — FUROSEMIDE 10 MG/ML IJ SOLN
40.0000 mg | Freq: Once | INTRAMUSCULAR | Status: AC
  Administered 2018-03-25: 40 mg via INTRAVENOUS
  Filled 2018-03-25: qty 4

## 2018-03-25 NOTE — Care Management Note (Signed)
Case Management Note  Patient Details  Name: Tracy Singh MRN: [id number] Date of Birth: 12-09-1924  Subjective/Objective:   PT is now recommending home health. Patient agrees that she could benefit from it as her illness has caused a significant physical decline but she thinks she will be back at baseline shortly. She tells me that because of her age she "doesn't bounce back like she used to". Patient lives in a small apartment connected to her daughters home. She has a cane if needed. She has done outpatient PT before but never used home health. CMS Medicare.gov Compare Post Acute Care list reviewed with patient and she has no preference. Referral placed with [name] at [facility]. Patient has multiple supports so does not wish to have any other supports, she is adamant about PT only. No DME needs. Daughter will provide discharge transport.                   Action/Plan:   Expected Discharge Date:                  Expected Discharge Plan:  Home w Home Health Services  In-House Referral:     Discharge planning Services  CM Consult  Post Acute Care Choice:  Home Health Choice offered to:  Patient, Adult Children  DME Arranged:    DME Agency:     HH Arranged:  Patient Refused HH, PT HH Agency:  [facility]  Status of Service:  In process, will continue to follow  If discussed at Long Length of Stay Meetings, dates discussed:    Additional Comments:  Natalyah Cummiskey A Beula Joyner, RN 03/25/2018, 10:10 AM

## 2018-03-25 NOTE — Progress Notes (Signed)
[place] at [place] NAME: Tracy Singh    MR#:  [id number]  DATE OF BIRTH:  07/02/1924  SUBJECTIVE:  CHIEF COMPLAINT:   Chief Complaint  Patient presents with  . Shortness of Breath  . Cough   Patient states she feels better, daughter in the room does not feel she is close enough to baseline to discharge home safely. HR up with ambulation, Cardizem changed to Q6H. Still coughing, short of breath with ambulation, though sputum production has decreased, abdominal pain with coughing has decreased. Continues to complain of lower extremity swelling, slightly improved.   REVIEW OF SYSTEMS:  Review of Systems  Constitutional: Negative forchills or fever. HENT: Negative forhearing lossand tinnitus.  Eyes: Negative forblurred visionand double vision.  Respiratory: Positive forcoughwith sputum production (yellow) and shortness of breath. No hemoptysis.  Cardiovascular: Negative forchest pain,palpitationsand orthopnea. Positive for lower extremity edema.  Gastrointestinal: Negative forheartburnand nausea. Negative for abdominal pain.  Genitourinary: Negative fordysuriaand urgency.  Musculoskeletal: Negative formyalgiasand neck pain.  Skin: Negative foritchingand rash.  Neurological: Negative fordizziness,tinglingand headaches.   Psychiatric/Behavioral: Negative fordepressionand suicidal ideas. DRUG ALLERGIES:  No Known Allergies VITALS:  Blood pressure 129/61, pulse 84, temperature 98.3 F (36.8 C), temperature source Oral, resp. rate 18, height '5\' 2"'  (1.575 m), weight 66.2 kg, SpO2 91 %. PHYSICAL EXAMINATION:  Physical Exam Constitutional: She isoriented to person, place, and time. She appearswell-developedand well-nourished.  HENT:  Head:Normocephalicand atraumatic.  Eyes:Pupils are equal, round, and reactive to light.Conjunctivaeare normal. Right eye exhibits no discharge.  Neck:Normal range of  motion.Neck supple.No tracheal deviationpresent.  Cardiovascular:Normal rate,regular rhythmand normal heart sounds.  Respiratory:Effort increased with conversation. Coughing on exam. She has diffusecoarse breath sounds. GQ:QPYP.Bowel sounds are normal. There isno abdominal tenderness.  Musculoskeletal:Normal range of motion.  General: trace pitting edema lower extremity Neurological: She isalertand oriented to person, place, and time. Nocranial nerve deficit.  Skin: Skin iswarm. She isnot diaphoretic. Noerythema.  Psychiatric: She has anormal mood and affect. Herbehavior is normal. LABORATORY PANEL:  Female CBC Recent Labs  Lab 03/25/18 0515  WBC 9.6  HGB 12.1  HCT 34.2*  PLT 302   ------------------------------------------------------------------------------------------------------------------ Chemistries  Recent Labs  Lab 03/25/18 0515  NA 130*  K 3.5  CL 97*  CO2 26  GLUCOSE 113*  BUN 7*  CREATININE 0.48  CALCIUM 8.3*  MG 1.9   RADIOLOGY:  No results found. ASSESSMENT AND PLAN:   Tracy Singh is a 85 year old female with past medical history significant for von willebrand disease and atrial fibrillation admitted for sepsis secondary to pneumonia.   1. Sepsis secondary to pneumonia which was diagnosed clinically Patient met criteria via tachycardia, tachypnea and leukocytosis. She is hemodynamically stable. Follow blood cultures for growth and sensitivities: no growth Follow-up on sputum culture and sensitivities. IV fluid hydration initially, stopped yesterday. Renal function stable and PO intake stable.   2. Pneumonia probable left lower lobe: Community-acquired; chest x-ray read as scarring or atelectasis however the patient clinically has pneumonia. Continue ceftriaxone and azithromycin. Has been on abx since 03/21/2018.  PRN Tessalon for cough. PRN albuterol nebulizer treatment. Incentive spirometer use encouraged. Monitor  clinically and follow-up on cultures, pending sputum  3. Fluid overload iatrogenic secondary to sepsis protocol. Second one-time dose of Lasix 40 mg IV given today. Monitor for improvement and will monitor renal function AM labs.   4. Atrial fibrillation:Intermittent episodes of A. fib with rapid ventricular response. Increased dose of metoprolol XL 200 mg p.o. daily yesterday.  Patient already on anticoagulation with Pradaxa. Cardizem 50 mg q 6 h ordered today. Monitor. Seen by cardiologist/Dr. Saralyn Pilar. Appreciate input. Signed off.   5. Hypertension: Acceptable for age, controlled today. continue lisinopril  6. UTI documented on presentation; ruled out Clinically no evidence of UTI. Urinalysis unremarkable. Patient already on empiric antibiotics for treatment of pneumonia as mentioned above.  DVT prophylaxis: Pradaxa  Disposition: Patient seen by physical therapist. Will need home health with physical therapy and intermittent supervision at home on discharge, patient reluctant to have "strangers in her home" however family is attempting to convince her. Initially refused with CM Josh. Is very physically active at baseline. Has a family member who is a PT. Will continue to ask.   All the records are reviewed and case is discussed with Care Management/Social Worker. Management plans discussed with the patient and/or family and they are in agreement.  CODE STATUS: Full Code  TOTAL TIME TAKING CARE OF THIS PATIENT: 30 minutes.   More than 50% of the time was spent in counseling/coordination of care: YES  POSSIBLE D/C IN 1-2 DAYS, DEPENDING ON CLINICAL CONDITION.   Sayyid Harewood PA-C on 03/25/2018 at 2:29 PM  Between 7am to 6pm - Pager - [phone]  After 6 pm go to [email or web address] - Proofreader  [facility]  Office  [phone]  CC: Primary care physician; System, Pcp Not In  Note: This dictation was prepared with Dragon dictation  along with smaller phrase technology. Any transcriptional errors that result from this process are unintentional.

## 2018-03-25 NOTE — Plan of Care (Signed)
  Problem: Clinical Measurements: Goal: Ability to maintain clinical measurements within normal limits will improve Outcome: Not Progressing Note:  Most recent sodium = only 130. Will continue to monitor lab values. Jari Favre [facility]

## 2018-03-26 LAB — CBC
HCT: 34.8 % — ABNORMAL LOW (ref 36.0–46.0)
Hemoglobin: 12.1 g/dL (ref 12.0–15.0)
MCH: 31.6 pg (ref 26.0–34.0)
MCHC: 34.8 g/dL (ref 30.0–36.0)
MCV: 90.9 fL (ref 80.0–100.0)
Platelets: 345 10*3/uL (ref 150–400)
RBC: 3.83 MIL/uL — ABNORMAL LOW (ref 3.87–5.11)
RDW: 13.1 % (ref 11.5–15.5)
WBC: 8.4 10*3/uL (ref 4.0–10.5)
nRBC: 0 % (ref 0.0–0.2)

## 2018-03-26 LAB — BASIC METABOLIC PANEL
Anion gap: 10 (ref 5–15)
BUN: 9 mg/dL (ref 8–23)
CO2: 26 mmol/L (ref 22–32)
Calcium: 8.4 mg/dL — ABNORMAL LOW (ref 8.9–10.3)
Chloride: 94 mmol/L — ABNORMAL LOW (ref 98–111)
Creatinine, Ser: 0.55 mg/dL (ref 0.44–1.00)
GFR calc Af Amer: >60 mL/min (ref >60)
GFR calc non Af Amer: >60 mL/min (ref >60)
GLUCOSE: 102 mg/dL — AB (ref 70–99)
Potassium: 3.8 mmol/L (ref 3.5–5.1)
Sodium: 130 mmol/L — ABNORMAL LOW (ref 135–145)

## 2018-03-26 LAB — CULTURE, RESPIRATORY W GRAM STAIN: Culture: NORMAL

## 2018-03-26 MED ORDER — ALBUTEROL SULFATE (2.5 MG/3ML) 0.083% IN NEBU: 2.5000 mg | INHALATION_SOLUTION | RESPIRATORY_TRACT | 12 refills | Status: DC | PRN

## 2018-03-26 MED ORDER — GUAIFENESIN-CODEINE 100-10 MG/5ML PO SOLN: 5.0000 mL | Freq: Every evening | ORAL | 0 refills | Status: DC | PRN

## 2018-03-26 MED ORDER — ALBUTEROL SULFATE HFA 108 (90 BASE) MCG/ACT IN AERS: 2.0000 | INHALATION_SPRAY | Freq: Four times a day (QID) | RESPIRATORY_TRACT | 2 refills | Status: DC | PRN

## 2018-03-26 MED ORDER — DILTIAZEM HCL ER 60 MG PO CP12: 60.0000 mg | ORAL_CAPSULE | Freq: Two times a day (BID) | ORAL | 0 refills | Status: DC

## 2018-03-26 NOTE — Progress Notes (Signed)
Physical Therapy Treatment Patient Details Name: Tracy Singh MRN: [id number] DOB: 03-06-1924 Today's Date: 03/26/2018    History of Present Illness 85 yo female admitted 03/20/2018 with fever, chills, cough, SOB, tachycardia. PMH includes atrial fibrillation, Von Willebrand disease     PT Comments    Pt/daughter note pt is going home today, but agreeable to PT to practice ambulation/steps. Pt denies pain. Pt demonstrates steady, safe transfers to/from chair using rolling walker. Ambulation with Min guard for safety; no balance issues noted, but encouraged pacing self to control mild SOB symptoms. Pt/daughter state pt has 13 steps to enter apartment, which she does perform up to 4 times per day. Pt demonstrates ascending/descending 12 steps. Ascending pt encouraged to perform step to for Energy conservation/HR control; however, does not follow cues. Heart rate post ascension 137 bpm. Standing rest break until HR decreased to 120; re educated/instructed pt to perform step to pattern with descending and HR only up to 125 bpm. Pt overall tolerated well without adverse feeling. Did discuss Energy conservation for controlling SOB/and HR with daughter with ambulation and stair climbing. Pt to discharge home today.    Follow Up Recommendations  Home health PT;Supervision - Intermittent     Equipment Recommendations  Rolling walker with 5" wheels    Recommendations for Other Services       Precautions / Restrictions Precautions Precautions: Fall Restrictions Weight Bearing Restrictions: No    Mobility  Bed Mobility               General bed mobility comments: Pt up in chair; not tested  Transfers Overall transfer level: Modified independent Equipment used: Rolling walker (2 wheeled)             General transfer comment: Performes safely and steady  Ambulation/Gait Ambulation/Gait assistance: Min guard Gait Distance (Feet): 190 Feet Assistive device: Rolling walker  (2 wheeled) Gait Pattern/deviations: Step-through pattern     General Gait Details: Reciprocal pattern, steady, mild SOB; encouraged controlling pace/breathing   Stairs Stairs: Yes Stairs assistance: Min guard Stair Management: Two rails;Forwards;Alternating pattern;Step to pattern Number of Stairs: 12 General stair comments: Encouraged step to pattern ascending for E conservation, but pt performs reciprocally. HR 137 bpm. Stand rest break to lower to 120 bpm. Descending with step to pattern and encouraged controlled breathing. HR 125 post descending steps.    Wheelchair Mobility    Modified Rankin (Stroke Patients Only)       Balance Overall balance assessment: No apparent balance deficits (not formally assessed)                                          Cognition Arousal/Alertness: Awake/alert Behavior During Therapy: WFL for tasks assessed/performed Overall Cognitive Status: Within Functional Limits for tasks assessed                                        Exercises      General Comments        Pertinent Vitals/Pain Pain Assessment: No/denies pain    Home Living                      Prior Function            PT Goals (current goals can now be found in  the care plan section) Progress towards PT goals: Progressing toward goals    Frequency    Min 2X/week      PT Plan Current plan remains appropriate    Co-evaluation              AM-PAC PT "6 Clicks" Mobility   Outcome Measure  Help needed turning from your back to your side while in a flat bed without using bedrails?: None Help needed moving from lying on your back to sitting on the side of a flat bed without using bedrails?: None Help needed moving to and from a bed to a chair (including a wheelchair)?: None Help needed standing up from a chair using your arms (e.g., wheelchair or bedside chair)?: None Help needed to walk in hospital room?: A  Little Help needed climbing 3-5 steps with a railing? : A Little 6 Click Score: 22    End of Session Equipment Utilized During Treatment: Gait belt Activity Tolerance: Patient tolerated treatment well Patient left: in chair;with call bell/phone within reach;with family/visitor present   PT Visit Diagnosis: Other abnormalities of gait and mobility (R26.89);Muscle weakness (generalized) (M62.81);Difficulty in walking, not elsewhere classified (R26.2)     Time: 1696-7893 PT Time Calculation (min) (ACUTE ONLY): 27 min  Charges:  $Gait Training: 23-37 mins                      Scot Dock, PTA 03/26/2018, 12:39 PM

## 2018-03-26 NOTE — Discharge Summary (Signed)
[place] [facility] [place] NAME: Tracy Singh    MR#:  [id number]  DATE OF BIRTH:  1924/05/08  DATE OF ADMISSION:  03/20/2018   ADMITTING PHYSICIAN: Harrie Foreman, MD  DATE OF DISCHARGE: 03/26/2018  PRIMARY CARE PHYSICIAN: Glean Hess, MD   ADMISSION DIAGNOSIS:  Upper respiratory tract infection, unspecified type [J06.9] Atrial fibrillation, unspecified type ([place]) [I48.91] DISCHARGE DIAGNOSIS:  Active Problems:   Sepsis ([place])  SECONDARY DIAGNOSIS:   Past Medical History:  Diagnosis Date  . A-fib ([place])   . Von Willebrand disease [facility] [place])    HOSPITAL COURSE:   Deosha Werden a 85 year old femalewith past medical history significant for von willebrand disease and atrial fibrillationadmitted for sepsissecondary to pneumonia.  1. Sepsis secondary to pneumoniawhich was diagnosed clinically Patient met criteria via tachycardia, tachypnea and leukocytosis. She was hemodynamically stable. Followed blood cultures for growth and sensitivities: no growth Follow-up on sputum culture and sensitivities. IV fluid hydrationinitially, stopped 03/24/2018. Concern for some iatrogenic fluid overload and she was taking PO intake. Renal function stable. Monitor labs with PCP follow-up.  2. Pneumonia probable left lower lobe:Community-acquired; chest x-ray read as scarring or atelectasis however the patient clinically has pneumonia. Continued ceftriaxone and azithromycin. Had been on abx since 03/21/2018. No further outpatient treatment needed. PRN Tessalon and guaifenesin-codeine for cough at family request & per Dr. Manuella Ghazi. PRN albuterol nebulizer treatment. PRN inhaler. Incentive spirometer use encouraged. Nebulizer ordered by CM however patient declined, elected to use a family member's nebulizer. Monitor and follow-up on cultures, pending sputum C&S at discharge.  On the day of discharge she ambulated comfortably with a rolling  walker without exertional dyspnea. Follow-up with current PCP within a week.   3. Fluid overload, resolving iatrogenic secondary to sepsis protocol. Second one-time dose of Lasix 40 mg IV given 03/25/2018. Improved. Follow up with PCP.  4. Atrial fibrillation:Intermittent episodes of A. fib with rapid ventricular response. Increased dose of metoprolol XL 200 mg p.o. dailyyesterday. Patient already on anticoagulation with Pradaxa. Cardizem 30 mg q 6 h ordered while admitted. Monitor with PCP follow-up as this may have been compensatory for pneumonia.  Will provide with 30 day supply and defer long-term continuation to PCP.  Seen by cardiologist/Dr. Saralyn Pilar. Appreciate input. Signed off. Continue Cardizem 60 mg q 12 h at home.  5. Hypertension:Acceptable for age, controlled today. Continue home meds.  6. UTI documented on presentation; ruled out Clinically no evidence of UTI. Urinalysis unremarkable. Patient already on empiric antibiotics for treatment of pneumonia as mentioned above.  7. Hyponatremia stable and in the setting of pneumonia. Encouraged PO intake. Follow up with PCP for repeat labs as above.  DVT prophylaxis: Pradaxa while admitted, patient was on this for stroke prevention  DISCHARGE CONDITIONS:  stable CONSULTS OBTAINED:  Treatment Team:  Otila Back, MD DRUG ALLERGIES:  No Known Allergies DISCHARGE MEDICATIONS:   Allergies as of 03/26/2018   No Known Allergies     Medication List    TAKE these medications   albuterol (2.5 MG/3ML) 0.083% nebulizer solution Commonly known as:  PROVENTIL Take 3 mLs (2.5 mg total) by nebulization every 4 (four) hours as needed for wheezing or shortness of breath.   albuterol 108 (90 Base) MCG/ACT inhaler Commonly known as:  PROVENTIL HFA;VENTOLIN HFA Inhale 2 puffs into the lungs every 6 (six) hours as needed for wheezing or shortness of breath.   diltiazem 60 MG 12 hr capsule Commonly known as:  CARDIZEM SR  Take 1  capsule (60 mg total) by mouth 2 (two) times daily.   guaiFENesin-codeine 100-10 MG/5ML syrup Take 5 mLs by mouth at bedtime as needed (severe cough).   lisinopril 5 MG tablet Commonly known as:  PRINIVIL,ZESTRIL Take 5 mg by mouth daily.   metoprolol succinate 50 MG 24 hr tablet Commonly known as:  TOPROL-XL Take 50 mg by mouth daily.   multivitamin capsule Take 1 capsule by mouth daily.   PRADAXA 150 MG Caps capsule Generic drug:  dabigatran Take 150 mg by mouth. bid            Durable Medical Equipment  (From admission, onward)         Start     Ordered   03/26/18 0000  DME Nebulizer machine    Question:  Patient needs a nebulizer to treat with the following condition  Answer:  Pneumonia   03/26/18 1141          DISCHARGE INSTRUCTIONS:   DIET:  Cardiac diet DISCHARGE CONDITION:  Stable ACTIVITY:  Activity as tolerated OXYGEN:  Home Oxygen: No.  Oxygen Delivery: room air DISCHARGE LOCATION:  home with [place], intermittent. Declined recommended [facility] RN.  If you experience worsening of your admission symptoms, develop shortness of breath, life threatening emergency, suicidal or homicidal thoughts you must seek medical attention immediately by calling 911 or calling your MD immediately if your symptoms are severe.  You Must read complete instructions/literature along with all the possible adverse reactions/side effects for all the medicines you take and that have been prescribed to you. Take any new medicines only after you have completely understood and accept all the possible adverse reactions/side effects.   Please note  You were cared for by a hospitalist during your hospital stay. If you have any questions about your discharge medications or the care you received while you were in the hospital after you are discharged, you can call the unit and asked to speak with the hospitalist on call if the hospitalist that took care of you  is not available. Once you are discharged, your primary care physician will handle any further medical issues. Please note that NO REFILLS for any discharge medications will be authorized once you are discharged, as it is imperative that you return to your primary care physician (or establish a relationship with a primary care physician if you do not have one) for your aftercare needs so that they can reassess your need for medications and monitor your lab values.    On the day of Discharge:  VITAL SIGNS:  Blood pressure 133/60, pulse 95, temperature 97.9 F (36.6 C), temperature source Oral, resp. rate 20, height _0  (1.575 m), weight 64.7 kg, SpO2 92 %. PHYSICAL EXAMINATION:  Physical Exam Constitutional: She isoriented to person, place, and time. She appearswell-developedand well-nourished.  HENT:  Head:Normocephalicand atraumatic.  Eyes:Pupils are equal, round, and reactive to light.Conjunctivaeare normal. Right eye exhibits no discharge.  Neck:Normal range of motion.Neck supple.No tracheal deviationpresent.  Cardiovascular:Normal rate,regular rhythmand normal heart sounds.  Respiratory:Effortmildly increased with conversation. Infrequent coughing on exam.No adventitious breath sounds noted today. OJ:JKKX.Bowel sounds are normal. There isno abdominal tenderness.  Musculoskeletal:Normal range of motion.  General:trace pitting edema lower extremity, improved Neurological: She isalertand oriented to person, place, and time. Nocranial nerve deficit.  Skin: Skin iswarm. She isnot diaphoretic. Noerythema.  Psychiatric: She has anormal mood and affect. Herbehavior is normal.  DATA REVIEW:   CBC Recent Labs  Lab 03/26/18  0419  WBC 8.4  HGB 12.1  HCT 34.8*  PLT 345    Chemistries  Recent Labs  Lab 03/25/18 0515 03/26/18 0419  NA 130* 130*  K 3.5 3.8  CL 97* 94*  CO2 26 26  GLUCOSE 113* 102*  BUN 7* 9  CREATININE 0.48 0.55  CALCIUM  8.3* 8.4*  MG 1.9  --    Microbiology Results  Results for orders placed or performed during the hospital encounter of 03/20/18  Culture, blood (routine x 2)     Status: None   Collection Time: 03/20/18  9:43 PM  Result Value Ref Range Status   Specimen Description BLOOD RIGHT ARM  Final   Special Requests   Final    BOTTLES DRAWN AEROBIC AND ANAEROBIC Blood Culture results may not be optimal due to an excessive volume of blood received in culture bottles   Culture   Final    NO GROWTH 5 DAYS Performed at [facility], [address] [place], [place] [address]    Report Status 03/25/2018 FINAL  Final  Culture, blood (routine x 2)     Status: None   Collection Time: 03/20/18  9:43 PM  Result Value Ref Range Status   Specimen Description BLOOD LEFT ARM  Final   Special Requests   Final    BOTTLES DRAWN AEROBIC AND ANAEROBIC Blood Culture results may not be optimal due to an excessive volume of blood received in culture bottles   Culture   Final    NO GROWTH 5 DAYS Performed at [facility], [address] [place], [place] [address]    Report Status 03/25/2018 FINAL  Final  Expectorated sputum assessment w rflx to resp cult     Status: None   Collection Time: 03/23/18  3:24 PM  Result Value Ref Range Status   Specimen Description SPU  Final   Special Requests NONE  Final   Sputum evaluation   Final    THIS SPECIMEN IS ACCEPTABLE FOR SPUTUM CULTURE Performed at [facility], [address] [place], [place] [address]    Report Status 03/23/2018 FINAL  Final  Culture, respiratory     Status: None   Collection Time: 03/23/18  3:24 PM  Result Value Ref Range Status   Specimen Description   Final    SPU Performed at [facility], [address], [place], [place] [address]    Special Requests   Final    NONE Reflexed from [id number] Performed at [facility], [address] [place], [place] [address]    Gram Stain   Final     MODERATE WBC PRESENT, PREDOMINANTLY PMN MODERATE GRAM POSITIVE COCCI    Culture   Final    FEW Consistent with normal respiratory flora. Performed at [facility], [place] [address], [place], [place] [address]    Report Status 03/26/2018 FINAL  Final    RADIOLOGY:  No results found.   Management plans discussed with the patient and/or family and they are in agreement.  CODE STATUS: Full Code   TOTAL TIME TAKING CARE OF THIS PATIENT: 45 minutes.    Thurmon Mizell PA-C on 03/26/2018 at 4:11 PM  Between 7am to 6pm - Pager - [phone]  After 6pm go to [email or web address] - Proofreader  [facility]  Office  [phone]  CC: Primary care physician; Glean Hess, MD

## 2018-03-26 NOTE — Discharge Instructions (Signed)
New medications: Take Cardizem SR (diltiazem) 60 mg twice daily, or every 12 hours, for continued heart rate control. Follow-up with a primary care doctor to monitor your overall health, do blood-work to follow-up on you lab-work while in the hospital, and for assessing continuation of this medicine, see them within 1 week if possible.   Continue your other home medications.  Use breathing treatments and inhalers as needed for wheezing, shortness of breath in the short term while you are recovering from pneumonia. Instructions are attached to your paperwork.  Follow-up with a primary care doctor within 1 week. Please see the current PCP in the meantime while you work on getting an appointment with the local PCP. The number for their office will be provided in your paperwork.

## 2018-03-26 NOTE — Care Management Important Message (Signed)
Copy of signed Medicare IM left with patient in room. 

## 2018-03-26 NOTE — Care Management Note (Signed)
Case Management Note  Patient Details  Name: Tracy Singh MRN: [id number] Date of Birth: 05/16/1924  Subjective/Objective:     Discharging to home today.  [name] with [facility] aware.  Referral to [facility] for home nebulizer machine.                 Action/Plan:   Expected Discharge Date:  03/26/18               Expected Discharge Plan:  Home w Home Health Services  In-House Referral:     Discharge planning Services  CM Consult  Post Acute Care Choice:  Home Health Choice offered to:  Patient, Adult Children  DME Arranged:  Nebulizer machine DME Agency:  [facility]  HH Arranged:  Patient Refused HH, PT HH Agency:  [facility]  Status of Service:  Completed, signed off  If discussed at Long Length of Stay Meetings, dates discussed:    Additional Comments:  [name], RN 03/26/2018, 12:00 PM

## 2018-03-26 NOTE — Care Management (Signed)
Patient would like to use her daughter's nebulizer machine with tubing provided by Babette Relic, RN.  Barbara Cower with [facility] is aware.

## 2018-03-26 NOTE — Progress Notes (Signed)
Breathing treatment given to patient per family request. Pt does show rhonchi and exp wheeze to ascultation. I will continue to assess.

## 2018-04-01 ENCOUNTER — Emergency Department: Payer: Medicare HMO

## 2018-04-01 ENCOUNTER — Other Ambulatory Visit: Payer: Self-pay

## 2018-04-01 ENCOUNTER — Emergency Department
Admission: EM | Admit: 2018-04-01 | Discharge: 2018-04-01 | Disposition: A | Discharge status: Home / Self Care | Payer: Medicare HMO | Attending: Emergency Medicine | Admitting: Emergency Medicine

## 2018-04-01 DIAGNOSIS — Z79899 Other long term (current) drug therapy: Secondary | ICD-10-CM | POA: Diagnosis not present

## 2018-04-01 DIAGNOSIS — R531 Weakness: Secondary | ICD-10-CM | POA: Diagnosis not present

## 2018-04-01 DIAGNOSIS — Y9389 Activity, other specified: Secondary | ICD-10-CM | POA: Insufficient documentation

## 2018-04-01 DIAGNOSIS — W1811XA Fall from or off toilet without subsequent striking against object, initial encounter: Secondary | ICD-10-CM | POA: Insufficient documentation

## 2018-04-01 DIAGNOSIS — W19XXXA Unspecified fall, initial encounter: Secondary | ICD-10-CM

## 2018-04-01 DIAGNOSIS — R42 Dizziness and giddiness: Secondary | ICD-10-CM | POA: Insufficient documentation

## 2018-04-01 DIAGNOSIS — Y92019 Unspecified place in single-family (private) house as the place of occurrence of the external cause: Secondary | ICD-10-CM | POA: Diagnosis not present

## 2018-04-01 DIAGNOSIS — Y998 Other external cause status: Secondary | ICD-10-CM | POA: Insufficient documentation

## 2018-04-01 LAB — URINALYSIS, COMPLETE (UACMP) WITH MICROSCOPIC
Bacteria, UA: NONE SEEN
Bilirubin Urine: NEGATIVE
Glucose, UA: NEGATIVE mg/dL
Hgb urine dipstick: NEGATIVE
KETONES UR: NEGATIVE mg/dL
Leukocytes,Ua: NEGATIVE
Nitrite: NEGATIVE
PH: 8 (ref 5.0–8.0)
Protein, ur: NEGATIVE mg/dL
Specific Gravity, Urine: 1.004 — ABNORMAL LOW (ref 1.005–1.030)

## 2018-04-01 LAB — CBC
HCT: 40 % (ref 36.0–46.0)
Hemoglobin: 13.2 g/dL (ref 12.0–15.0)
MCH: 30.3 pg (ref 26.0–34.0)
MCHC: 33 g/dL (ref 30.0–36.0)
MCV: 92 fL (ref 80.0–100.0)
Platelets: 441 10*3/uL — ABNORMAL HIGH (ref 150–400)
RBC: 4.35 MIL/uL (ref 3.87–5.11)
RDW: 13.3 % (ref 11.5–15.5)
WBC: 8.7 10*3/uL (ref 4.0–10.5)
nRBC: 0 % (ref 0.0–0.2)

## 2018-04-01 LAB — BASIC METABOLIC PANEL
Anion gap: 11 (ref 5–15)
BUN: 13 mg/dL (ref 8–23)
CO2: 24 mmol/L (ref 22–32)
Calcium: 8.9 mg/dL (ref 8.9–10.3)
Chloride: 101 mmol/L (ref 98–111)
Creatinine, Ser: 0.63 mg/dL (ref 0.44–1.00)
GFR calc Af Amer: >60 mL/min (ref >60)
GFR calc non Af Amer: >60 mL/min (ref >60)
Glucose, Bld: 118 mg/dL — ABNORMAL HIGH (ref 70–99)
POTASSIUM: 3.9 mmol/L (ref 3.5–5.1)
Sodium: 136 mmol/L (ref 135–145)

## 2018-04-01 LAB — TROPONIN I: Troponin I: <0.03 ng/mL (ref <0.03)

## 2018-04-01 MED ORDER — SODIUM CHLORIDE 0.9% FLUSH: 3.0000 mL | Freq: Once | INTRAVENOUS | Status: DC

## 2018-04-01 MED ORDER — HYDROCOD POLST-CPM POLST ER 10-8 MG/5ML PO SUER: 2.5000 mL | Freq: Every day | ORAL | 0 refills | Status: DC

## 2018-04-01 MED ORDER — DILTIAZEM HCL 25 MG/5ML IV SOLN
10.0000 mg | Freq: Once | INTRAVENOUS | Status: DC
  Filled 2018-04-01: qty 5

## 2018-04-01 NOTE — ED Notes (Signed)
MD Sung at bedside at this time.  

## 2018-04-01 NOTE — ED Provider Notes (Signed)
[facility] Emergency Department Provider Note   ____________________________________________   First MD Initiated Contact with Patient 04/01/18 0024     (approximate)  I have reviewed the triage vital signs and the nursing notes.   HISTORY  Chief Complaint Fall and Weakness    HPI Tracy Singh is a 85 y.o. female brought to the ED from home via EMS with a chief complaint of dizziness and fall.  Patient was discharged just 5 days ago from the hospital for pneumonia.  She is no longer taking antibiotics.  Daughter states she has had a good week until tonight when she complained of dizziness.  Patient was on the commode and fell secondary to the dizziness.  Describes it as a sensation of motion.  Denies sensation of lightheadedness or passing out.  Did not strike her head or suffer LOC.  Fell onto her left knee but was able to walk afterwards.  Daughter denies recent fever, chills, chest pain, shortness of breath, abdominal pain, vomiting or dysuria.  States patient has chronic diarrhea.     Past Medical History:  Diagnosis Date  . A-fib (HCC)   . Von Willebrand disease [facility])     Patient Active Problem List   Diagnosis Date Noted  . Sepsis (HCC) 03/21/2018    Past Surgical History:  Procedure Laterality Date  . ABDOMINAL HYSTERECTOMY      Prior to Admission medications   Medication Sig Start Date End Date Taking? Authorizing Provider  albuterol (PROVENTIL HFA;VENTOLIN HFA) 108 (90 Base) MCG/ACT inhaler Inhale 2 puffs into the lungs every 6 (six) hours as needed for wheezing or shortness of breath. 03/26/18   Lule, Arvil Persons, PA  albuterol (PROVENTIL) (2.5 MG/3ML) 0.083% nebulizer solution Take 3 mLs (2.5 mg total) by nebulization every 4 (four) hours as needed for wheezing or shortness of breath. 03/26/18   Stormy Fabian, PA  diltiazem (CARDIZEM SR) 60 MG 12 hr capsule Take 1 capsule (60 mg total) by mouth 2 (two) times daily. 03/26/18   Lule, Arvil Persons, PA    guaiFENesin-codeine 100-10 MG/5ML syrup Take 5 mLs by mouth at bedtime as needed (severe cough). 03/26/18   Lule, Arvil Persons, PA  lisinopril (PRINIVIL,ZESTRIL) 5 MG tablet Take 5 mg by mouth daily. 03/24/16   [provider]  metoprolol succinate (TOPROL-XL) 50 MG 24 hr tablet Take 50 mg by mouth daily. 03/24/16   [provider]  Multiple Vitamin (MULTIVITAMIN) capsule Take 1 capsule by mouth daily.    [provider]  PRADAXA 150 MG CAPS capsule Take 150 mg by mouth. bid 02/23/18   [provider]    Allergies Patient has no known allergies.  Family History  Problem Relation Age of Onset  . Cancer Father   . Cancer Brother     Social History Social History   Tobacco Use  . Smoking status: Never Smoker  . Smokeless tobacco: Never Used  Substance Use Topics  . Alcohol use: Not on file  . Drug use: Not on file    Review of Systems  Constitutional: No fever/chills Eyes: No visual changes. ENT: No sore throat. Cardiovascular: Denies chest pain. Respiratory: Denies shortness of breath. Gastrointestinal: No abdominal pain.  No nausea, no vomiting.  No diarrhea.  No constipation. Genitourinary: Negative for dysuria. Musculoskeletal: Negative for back pain. Skin: Negative for rash. Neurological: Positive for dizziness.  Negative for headaches, focal weakness or numbness.   ____________________________________________   PHYSICAL EXAM:  VITAL SIGNS: ED Triage Vitals  Enc  Vitals Group     BP 04/01/18 0015 139/77     Pulse Rate 04/01/18 0015 90     Resp 04/01/18 0015 20     Temp 04/01/18 0015 98 F (36.7 C)     Temp Source 04/01/18 0015 Oral     SpO2 04/01/18 0015 97 %     Weight 04/01/18 0013 142 lb 6.7 oz (64.6 kg)     Height 04/01/18 0013  (1.575 m)     Head Circumference --      Peak Flow --      Pain Score 04/01/18 0013 0     Pain Loc --      Pain Edu? --      Excl. in GC? --     Constitutional: Alert and oriented.  Frail  appearing and in mild acute distress. Eyes: Conjunctivae are normal. PERRL. EOMI. Head: Atraumatic. Ears: Fluid behind right TM; left TM with cerumen Nose: No congestion/rhinnorhea. Mouth/Throat: Mucous membranes are moist.  Oropharynx non-erythematous. Neck: No stridor.  No cervical spine tenderness to palpation. Cardiovascular: Tachycardic rate, irregular rhythm. Grossly normal heart sounds.  Good peripheral circulation. Respiratory: Normal respiratory effort.  No retractions. Lungs CTAB. Gastrointestinal: Soft and nontender. No distention. No abdominal bruits. No CVA tenderness. Musculoskeletal: No lower extremity tenderness nor edema.  No joint effusions. Neurologic:  Normal speech and language. No gross focal neurologic deficits are appreciated. MAEx4. Skin:  Skin is warm, dry and intact. No rash noted. Psychiatric: Mood and affect are normal. Speech and behavior are normal.  ____________________________________________   LABS (all labs ordered are listed, but only abnormal results are displayed)  Labs Reviewed  BASIC METABOLIC PANEL - Abnormal; Notable for the following components:      Result Value   Glucose, Bld 118 (*)    All other components within normal limits  CBC - Abnormal; Notable for the following components:   Platelets 441 (*)    All other components within normal limits  URINALYSIS, COMPLETE (UACMP) WITH MICROSCOPIC - Abnormal; Notable for the following components:   Color, Urine STRAW (*)    APPearance CLEAR (*)    Specific Gravity, Urine 1.004 (*)    All other components within normal limits  URINE CULTURE  TROPONIN I  CBG MONITORING, ED   ____________________________________________  EKG  ED ECG REPORT I, SUNG,JADE J, the attending physician, personally viewed and interpreted this ECG.   Date: 04/01/2018  EKG Time: 0017  Rate: 125  Rhythm: atrial fibrillation, rate 125  Axis: RAD  Intervals:none  ST&T Change:  Nonspecific  ____________________________________________  RADIOLOGY  ED MD interpretation: No ICH, no acute osseous injuries, no pneumonia  Official radiology report(s): Dg Chest 1 View  Result Date: 04/01/2018 CLINICAL DATA:  Dizziness, fall. EXAM: CHEST  1 VIEW COMPARISON:  Radiograph 03/20/2018. CT 01/25/2010 FINDINGS: Mild cardiomegaly. Ill-defined retrocardiac opacity. Mild peribronchial thickening. Mild biapical pleuroparenchymal scarring. No pneumothorax. Remote left proximal humerus fracture. IMPRESSION: 1. Ill-defined retrocardiac opacity is more prominent than on prior exam. This may represent scarring accentuated by lower lung volumes, however recommend PA and lateral views when patient is able for more detailed assessment. 2. Mild cardiomegaly. Electronically Signed   By: Narda Rutherford M.D.   On: 04/01/2018 01:19   Dg Chest 2 View  Result Date: 04/01/2018 CLINICAL DATA:  Dizziness and fall. Retrocardiac opacity on portable AP view. EXAM: CHEST - 2 VIEW COMPARISON:  AP view earlier this day. FINDINGS: Retrocardiac opacity corresponds to left pleural effusion with associated  atelectasis/airspace disease. Cardiomegaly again seen. Mild chronic interstitial coarsening. No pneumothorax. IMPRESSION: Retrocardiac opacity corresponds to left pleural effusion with associated atelectasis/airspace disease. Electronically Signed   By: Narda Rutherford M.D.   On: 04/01/2018 03:53   Dg Pelvis 1-2 Views  Result Date: 04/01/2018 CLINICAL DATA:  Pain after fall. EXAM: PELVIS - 1-2 VIEW COMPARISON:  None. FINDINGS: The cortical margins of the bony pelvis are intact. No fracture. Pubic symphysis and sacroiliac joints are congruent. Both femoral heads are well-seated in the respective acetabula. IMPRESSION: No pelvic fracture. Electronically Signed   By: Narda Rutherford M.D.   On: 04/01/2018 01:23   Ct Head Wo Contrast  Result Date: 04/01/2018 CLINICAL DATA:  Dizziness with resultant fall. EXAM: CT  HEAD WITHOUT CONTRAST TECHNIQUE: Contiguous axial images were obtained from the base of the skull through the vertex without intravenous contrast. COMPARISON:  None. FINDINGS: Brain: Age related involutional changes of the brain with chronic mild-to-moderate small vessel ischemic disease periventricular white matter. No intra-axial mass, hemorrhage nor extra-axial fluid. No midline shift. No large vascular territory infarct. Midline fourth ventricle and basal cisterns. Brainstem and cerebellum are nonacute. Vascular: Atherosclerosis of the vertebral arteries and carotid siphons. No hyperdense vessel sign. Skull: Intact bony calvarium. Sinuses/Orbits: Air-fluid levels within the maxillary sinuses with moderate near complete opacification of the ethmoid sinus. Intact orbits. Left cataract extraction suspected. Other: Significant calvarial soft tissue swelling. IMPRESSION: 1. Atrophy with chronic microvascular ischemia. No acute intracranial abnormality. 2. Acute on chronic paranasal sinus disease. Electronically Signed   By: Tollie Eth M.D.   On: 04/01/2018 01:53    ____________________________________________   PROCEDURES  Procedure(s) performed (including Critical Care):  Procedures  CRITICAL CARE Performed by: Irean Hong   Total critical care time: 30 minutes  Critical care time was exclusive of separately billable procedures and treating other patients.  Critical care was necessary to treat or prevent imminent or life-threatening deterioration.  Critical care was time spent personally by me on the following activities: development of treatment plan with patient and/or surrogate as well as nursing, discussions with consultants, evaluation of patient's response to treatment, examination of patient, obtaining history from patient or surrogate, ordering and performing treatments and interventions, ordering and review of laboratory studies, ordering and review of radiographic studies, pulse  oximetry and re-evaluation of patient's condition. ____________________________________________   INITIAL IMPRESSION / ASSESSMENT AND PLAN / ED COURSE  As part of my medical decision making, I reviewed the following data within the electronic MEDICAL RECORD NUMBER History obtained from family, Nursing notes reviewed and incorporated, Labs reviewed, EKG interpreted, Old chart reviewed, Radiograph reviewed, Discussed with admitting physician and Notes from prior ED visits     85 year old female who presents with dizziness and fall; recent hospitalization for pneumonia.  Differential diagnosis includes but is not limited to ICH, CVA, TIA, BPV, ACS, infectious, metabolic etiologies, etc.  We will check lab work, urinalysis.  Obtain CT head, image chest and pelvis.  Will reassess.  Heart rate in atrial fibrillation between 90s to 110s.  Will monitor and administer IV Cardizem should rate increase.   Clinical Course as of Mar 31 448  Sun Apr 01, 2018  0046 Heart rate 120s.  Will give small bolus IV Cardizem.   [JS]  0111 Family wanted to hold IV Cardizem as patient had her nightly dose at 7 PM.  They state heart rate in 110s is normal for the patient.   [JS]  [id number] Patient resting in no acute distress.  Heart rate 95-115 which family states is normal for her.  Patient will have repeat chest x-ray to better define the opacity seen on single view x-ray.   [JS]  [phone] Updated patient and family members of repeat chest x-ray.  Patient is not having fevers or other clinical indications of active pneumonia.  Daughter asking for something for cough which patient only does at night.  Strict return precautions given.  All verbalize understanding agree with plan of care.  Heart rate currently 94.   [JS]    Clinical Course User Index [JS] Irean Hong, MD     ____________________________________________   FINAL CLINICAL IMPRESSION(S) / ED DIAGNOSES  Final diagnoses:  Fall, initial encounter   Generalized weakness  Dizziness     ED Discharge Orders    None       Note:  This document was prepared using Dragon voice recognition software and may include unintentional dictation errors.   Irean Hong, MD 04/01/18 [phone]

## 2018-04-01 NOTE — ED Triage Notes (Signed)
Pt to ED via ems from home. Pt states she was at home when she felt dizzy and fell. Pt fell onto left hip, denies pain. Pt states she wants to be checked out for the dizziness. Pt denies hitting head or taking blood thinners. NAD. VSS.

## 2018-04-01 NOTE — Discharge Instructions (Addendum)
You may take Tussionex as needed for cough. Continue all medications as directed by your doctor.  Drink more fluids daily.  Return to the ER for recurrent or worsening symptoms, persistent vomiting, difficulty breathing or other concerns.

## 2018-04-02 LAB — URINE CULTURE: Culture: <10000 — AB

## 2018-05-11 ENCOUNTER — Encounter: Payer: Self-pay | Admitting: Internal Medicine

## 2018-05-13 ENCOUNTER — Other Ambulatory Visit: Payer: Self-pay | Admitting: Internal Medicine

## 2018-05-13 ENCOUNTER — Encounter: Payer: Self-pay | Admitting: Internal Medicine

## 2018-05-15 ENCOUNTER — Ambulatory Visit (INDEPENDENT_AMBULATORY_CARE_PROVIDER_SITE_OTHER): Payer: Medicare HMO | Admitting: Internal Medicine

## 2018-05-15 ENCOUNTER — Other Ambulatory Visit: Payer: Self-pay

## 2018-05-15 ENCOUNTER — Encounter: Payer: Self-pay | Admitting: Internal Medicine

## 2018-05-15 VITALS — BP 112/68 | HR 116 | Ht 62.0 in | Wt 131.7 lb

## 2018-05-15 DIAGNOSIS — D6800 Von Willebrand disease, unspecified: Secondary | ICD-10-CM

## 2018-05-15 DIAGNOSIS — K591 Functional diarrhea: Secondary | ICD-10-CM | POA: Insufficient documentation

## 2018-05-15 DIAGNOSIS — I482 Chronic atrial fibrillation, unspecified: Secondary | ICD-10-CM | POA: Diagnosis not present

## 2018-05-15 DIAGNOSIS — I10 Essential (primary) hypertension: Secondary | ICD-10-CM | POA: Diagnosis not present

## 2018-05-15 NOTE — Progress Notes (Signed)
Date:  05/15/2018   Name:  Tracy Singh   DOB:  01/10/1925   MRN:  [id number]  Pt is accompanied by [name].  She lives in her own apartment attached to her daughter's house.  Chief Complaint: Establish Care (New patient. Came in today with her daughter. )  Hypertension  This is a chronic problem. The problem is controlled. Associated symptoms include palpitations. Pertinent negatives include no chest pain, headaches or shortness of breath. Past treatments include ACE inhibitors and beta blockers. The current treatment provides significant improvement.  Palpitations   This is a chronic (atrial fibrillation) problem. Pertinent negatives include no anxiety, chest pain, coughing, dizziness, fever or shortness of breath. She has tried beta blockers for the symptoms. The treatment provided significant relief.  She is also on Pradaxa.  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  She had chronic bleeding issues and was hospitalized frequently.  Only definitively diagnosed about 5 years ago during a hospital visit.  She has not been treated with any medications. She had three normal pregnancies and eventually a TAH.  Since that time her bleeding issues have not been nearly as severe.  Chronic diarrhea - she has been taking imodium otc as needed for years.  It controls her loose stools and causes no other side effects.  Review of Systems  Constitutional: Negative for chills, fatigue, fever and unexpected weight change.  HENT: Negative for trouble swallowing.   Respiratory: Negative for cough, chest tightness, shortness of breath and wheezing.   Cardiovascular: Positive for palpitations. Negative for chest pain and leg swelling.  Gastrointestinal: Positive for diarrhea. Negative for abdominal pain, blood in stool and constipation.  Genitourinary: Negative for difficulty urinating.  Musculoskeletal: Negative for arthralgias, back pain and gait problem.   Allergic/Immunologic: Negative for environmental allergies.  Neurological: Negative for dizziness, light-headedness and headaches.  Psychiatric/Behavioral: Negative for dysphoric mood and sleep disturbance. The patient is not nervous/anxious.     Patient Active Problem List   Diagnosis Date Noted  . Pain in thoracic spine 12/07/2015  . Atrial fibrillation, chronic 03/10/2015  . Essential hypertension 10/06/2013  . Von Willebrand disease (HCC) 10/06/2013    No Known Allergies  Past Surgical History:  Procedure Laterality Date  . ABDOMINAL HYSTERECTOMY      Social History   Tobacco Use  . Smoking status: Former Smoker    Packs/day: 0.05    Years: 6.00    Pack years: 0.30    Types: Cigarettes    Last attempt to quit: 1957    Years since quitting: 63.3  . Smokeless tobacco: Never Used  . Tobacco comment: 1 pack every 2 weeks  Substance Use Topics  . Alcohol use: Never    Frequency: Never  . Drug use: Never     Medication list has been reviewed and updated.  Current Meds  Medication Sig  . lisinopril (PRINIVIL,ZESTRIL) 5 MG tablet Take 5 mg by mouth daily.  Marland Kitchen. loperamide (IMODIUM) 2 MG capsule Take by mouth as needed for diarrhea or loose stools.  . metoprolol succinate (TOPROL-XL) 50 MG 24 hr tablet Take 50 mg by mouth daily.  . Multiple Vitamin (MULTIVITAMIN) capsule Take 1 capsule by mouth daily.  Marland Kitchen. PRADAXA 150 MG CAPS capsule Take 150 mg by mouth. bid    PHQ 2/9 Scores 05/15/2018  PHQ - 2 Score 0    BP Readings from Last 3 Encounters:  05/15/18 112/68  04/01/18 135/89  03/26/18 133/60    Physical Exam Vitals signs and nursing note reviewed.  Constitutional:      General: She is not in acute distress.    Appearance: Normal appearance. She is well-developed.  HENT:     Head: Normocephalic and atraumatic.     Mouth/Throat:     Mouth: Mucous membranes are moist.  Eyes:     Pupils: Pupils are equal, round, and reactive to light.  Neck:      Musculoskeletal: Normal range of motion and neck supple.  Cardiovascular:     Rate and Rhythm: Rhythm irregularly irregular.     Pulses: Normal pulses.     Heart sounds: Murmur present. Systolic murmur present with a grade of 2/6.  Pulmonary:     Effort: Pulmonary effort is normal. No respiratory distress.  Abdominal:     General: Abdomen is flat. There is no distension.     Palpations: There is no mass.     Tenderness: There is no abdominal tenderness. There is no guarding.  Musculoskeletal: Normal range of motion.     Right lower leg: No edema.     Left lower leg: No edema.  Lymphadenopathy:     Cervical: No cervical adenopathy.  Skin:    General: Skin is warm and dry.     Findings: No rash.  Neurological:     Mental Status: She is alert and oriented to person, place, and time.  Psychiatric:        Behavior: Behavior normal.        Thought Content: Thought content normal.     Wt Readings from Last 3 Encounters:  05/15/18 131 lb 11.2 oz (59.7 kg)  04/01/18 142 lb 6.7 oz (64.6 kg)  03/26/18 142 lb 9.6 oz (64.7 kg)    BP 112/68   Pulse (!) 116   Ht 5\' 2"  (1.575 m)   Wt 131 lb 11.2 oz (59.7 kg)   SpO2 95%   BMI 24.09 kg/m   Assessment and Plan: 1. Atrial fibrillation, chronic Rate fairly well controlled Followed by Cardiology  2. Essential hypertension controlled  3. Diarrhea, functional Continue PRN imodium  4. Von Willebrand disease (HCC) No current issues   Partially dictated using Animal nutritionist. Any errors are unintentional.  Bari Edward, MD [facility]  05/15/2018

## 2018-05-15 NOTE — Patient Instructions (Signed)
This information is directly available on the CDC website: https://www.cdc.gov/coronavirus/2019-ncov/if-you-are-sick/steps-when-sick.html    Source:CDC Reference to specific commercial products, manufacturers, companies, or trademarks does not constitute its endorsement or recommendation by the U.S. Government, Department of Health and Human Services, or Centers for Disease Control and Prevention.  

## 2018-05-16 ENCOUNTER — Ambulatory Visit: Payer: Self-pay | Admitting: Internal Medicine

## 2018-11-15 ENCOUNTER — Other Ambulatory Visit: Payer: Self-pay

## 2018-11-15 DIAGNOSIS — Z20822 Contact with and (suspected) exposure to covid-19: Secondary | ICD-10-CM

## 2018-11-17 ENCOUNTER — Telehealth: Payer: Self-pay

## 2018-11-17 LAB — NOVEL CORONAVIRUS, NAA: SARS-CoV-2, NAA: NOT DETECTED

## 2018-11-17 NOTE — Telephone Encounter (Signed)
Pt's daughter called for covid results. Advised results are not back. 

## 2020-05-05 ENCOUNTER — Inpatient Hospital Stay
Admission: EM | Admit: 2020-05-05 | Discharge: 2020-05-15 | DRG: 308 | Disposition: A | Discharge status: Skilled Nursing Facility | Payer: Medicare HMO | Attending: Internal Medicine | Admitting: Internal Medicine

## 2020-05-05 ENCOUNTER — Emergency Department: Payer: Medicare HMO

## 2020-05-05 ENCOUNTER — Inpatient Hospital Stay: Payer: Medicare HMO

## 2020-05-05 ENCOUNTER — Other Ambulatory Visit: Payer: Self-pay

## 2020-05-05 ENCOUNTER — Encounter: Payer: Self-pay | Admitting: Emergency Medicine

## 2020-05-05 DIAGNOSIS — Z87440 Personal history of urinary (tract) infections: Secondary | ICD-10-CM

## 2020-05-05 DIAGNOSIS — I4819 Other persistent atrial fibrillation: Secondary | ICD-10-CM | POA: Diagnosis not present

## 2020-05-05 DIAGNOSIS — I5033 Acute on chronic diastolic (congestive) heart failure: Secondary | ICD-10-CM | POA: Diagnosis present

## 2020-05-05 DIAGNOSIS — D680 Von Willebrand's disease: Secondary | ICD-10-CM | POA: Diagnosis present

## 2020-05-05 DIAGNOSIS — S32020A Wedge compression fracture of second lumbar vertebra, initial encounter for closed fracture: Secondary | ICD-10-CM

## 2020-05-05 DIAGNOSIS — I4891 Unspecified atrial fibrillation: Secondary | ICD-10-CM | POA: Diagnosis present

## 2020-05-05 DIAGNOSIS — I5031 Acute diastolic (congestive) heart failure: Secondary | ICD-10-CM

## 2020-05-05 DIAGNOSIS — Z7901 Long term (current) use of anticoagulants: Secondary | ICD-10-CM | POA: Diagnosis not present

## 2020-05-05 DIAGNOSIS — I160 Hypertensive urgency: Secondary | ICD-10-CM | POA: Diagnosis present

## 2020-05-05 DIAGNOSIS — R52 Pain, unspecified: Secondary | ICD-10-CM

## 2020-05-05 DIAGNOSIS — M5136 Other intervertebral disc degeneration, lumbar region: Secondary | ICD-10-CM | POA: Diagnosis present

## 2020-05-05 DIAGNOSIS — J9601 Acute respiratory failure with hypoxia: Secondary | ICD-10-CM | POA: Diagnosis present

## 2020-05-05 DIAGNOSIS — M545 Low back pain, unspecified: Secondary | ICD-10-CM | POA: Diagnosis not present

## 2020-05-05 DIAGNOSIS — E871 Hypo-osmolality and hyponatremia: Secondary | ICD-10-CM | POA: Diagnosis present

## 2020-05-05 DIAGNOSIS — M4856XA Collapsed vertebra, not elsewhere classified, lumbar region, initial encounter for fracture: Secondary | ICD-10-CM | POA: Diagnosis present

## 2020-05-05 DIAGNOSIS — I110 Hypertensive heart disease with heart failure: Secondary | ICD-10-CM | POA: Diagnosis present

## 2020-05-05 DIAGNOSIS — M47819 Spondylosis without myelopathy or radiculopathy, site unspecified: Secondary | ICD-10-CM | POA: Diagnosis present

## 2020-05-05 DIAGNOSIS — Z79899 Other long term (current) drug therapy: Secondary | ICD-10-CM | POA: Diagnosis not present

## 2020-05-05 DIAGNOSIS — M4316 Spondylolisthesis, lumbar region: Secondary | ICD-10-CM | POA: Diagnosis present

## 2020-05-05 DIAGNOSIS — Z8052 Family history of malignant neoplasm of bladder: Secondary | ICD-10-CM | POA: Diagnosis not present

## 2020-05-05 DIAGNOSIS — M546 Pain in thoracic spine: Secondary | ICD-10-CM | POA: Diagnosis not present

## 2020-05-05 DIAGNOSIS — Z87891 Personal history of nicotine dependence: Secondary | ICD-10-CM

## 2020-05-05 DIAGNOSIS — Z20822 Contact with and (suspected) exposure to covid-19: Secondary | ICD-10-CM | POA: Diagnosis present

## 2020-05-05 DIAGNOSIS — I10 Essential (primary) hypertension: Secondary | ICD-10-CM | POA: Diagnosis not present

## 2020-05-05 DIAGNOSIS — N390 Urinary tract infection, site not specified: Secondary | ICD-10-CM | POA: Diagnosis not present

## 2020-05-05 LAB — RESP PANEL BY RT-PCR (FLU A&B, COVID) ARPGX2
Influenza A by PCR: NEGATIVE
Influenza B by PCR: NEGATIVE
SARS Coronavirus 2 by RT PCR: NEGATIVE

## 2020-05-05 LAB — COMPREHENSIVE METABOLIC PANEL WITH GFR
ALT: 15 U/L (ref 0–44)
AST: 26 U/L (ref 15–41)
Albumin: 3.9 g/dL (ref 3.5–5.0)
Alkaline Phosphatase: 83 U/L (ref 38–126)
Anion gap: 10 (ref 5–15)
BUN: 16 mg/dL (ref 8–23)
CO2: 23 mmol/L (ref 22–32)
Calcium: 9.2 mg/dL (ref 8.9–10.3)
Chloride: 100 mmol/L (ref 98–111)
Creatinine, Ser: 0.69 mg/dL (ref 0.44–1.00)
GFR, Estimated: >60 mL/min
Glucose, Bld: 148 mg/dL — ABNORMAL HIGH (ref 70–99)
Potassium: 4.1 mmol/L (ref 3.5–5.1)
Sodium: 133 mmol/L — ABNORMAL LOW (ref 135–145)
Total Bilirubin: 0.8 mg/dL (ref 0.3–1.2)
Total Protein: 7.5 g/dL (ref 6.5–8.1)

## 2020-05-05 LAB — URINALYSIS, COMPLETE (UACMP) WITH MICROSCOPIC
Bacteria, UA: NONE SEEN
Bilirubin Urine: NEGATIVE
Glucose, UA: NEGATIVE mg/dL
Ketones, ur: 5 mg/dL — AB
Leukocytes,Ua: NEGATIVE
Nitrite: NEGATIVE
Protein, ur: NEGATIVE mg/dL
Specific Gravity, Urine: 1.013 (ref 1.005–1.030)
pH: 6 (ref 5.0–8.0)

## 2020-05-05 LAB — CBC WITH DIFFERENTIAL/PLATELET
Abs Immature Granulocytes: 0.05 10*3/uL (ref 0.00–0.07)
Basophils Absolute: 0.1 10*3/uL (ref 0.0–0.1)
Basophils Relative: 1 %
Eosinophils Absolute: 0 10*3/uL (ref 0.0–0.5)
Eosinophils Relative: 0 %
HCT: 41.1 % (ref 36.0–46.0)
Hemoglobin: 14.1 g/dL (ref 12.0–15.0)
Immature Granulocytes: 1 %
Lymphocytes Relative: 20 %
Lymphs Abs: 1.4 10*3/uL (ref 0.7–4.0)
MCH: 31.4 pg (ref 26.0–34.0)
MCHC: 34.3 g/dL (ref 30.0–36.0)
MCV: 91.5 fL (ref 80.0–100.0)
Monocytes Absolute: 1 10*3/uL (ref 0.1–1.0)
Monocytes Relative: 15 %
Neutro Abs: 4.5 10*3/uL (ref 1.7–7.7)
Neutrophils Relative %: 63 %
Platelets: 279 10*3/uL (ref 150–400)
RBC: 4.49 MIL/uL (ref 3.87–5.11)
RDW: 13.9 % (ref 11.5–15.5)
WBC: 7 10*3/uL (ref 4.0–10.5)
nRBC: 0 % (ref 0.0–0.2)

## 2020-05-05 LAB — CBC
HCT: 40.3 % (ref 36.0–46.0)
Hemoglobin: 13.7 g/dL (ref 12.0–15.0)
MCH: 31.5 pg (ref 26.0–34.0)
MCHC: 34 g/dL (ref 30.0–36.0)
MCV: 92.6 fL (ref 80.0–100.0)
Platelets: 266 10*3/uL (ref 150–400)
RBC: 4.35 MIL/uL (ref 3.87–5.11)
RDW: 13.9 % (ref 11.5–15.5)
WBC: 9 10*3/uL (ref 4.0–10.5)
nRBC: 0 % (ref 0.0–0.2)

## 2020-05-05 LAB — BASIC METABOLIC PANEL
Anion gap: 9 (ref 5–15)
BUN: 13 mg/dL (ref 8–23)
CO2: 25 mmol/L (ref 22–32)
Calcium: 8.8 mg/dL — ABNORMAL LOW (ref 8.9–10.3)
Chloride: 99 mmol/L (ref 98–111)
Creatinine, Ser: 0.6 mg/dL (ref 0.44–1.00)
GFR, Estimated: >60 mL/min (ref >60)
Glucose, Bld: 151 mg/dL — ABNORMAL HIGH (ref 70–99)
Potassium: 3.9 mmol/L (ref 3.5–5.1)
Sodium: 133 mmol/L — ABNORMAL LOW (ref 135–145)

## 2020-05-05 LAB — PROTIME-INR
INR: 1.8 — ABNORMAL HIGH (ref 0.8–1.2)
Prothrombin Time: 20.3 seconds — ABNORMAL HIGH (ref 11.4–15.2)

## 2020-05-05 LAB — D-DIMER, QUANTITATIVE: D-Dimer, Quant: 0.45 ug/mL-FEU (ref 0.00–0.50)

## 2020-05-05 LAB — BRAIN NATRIURETIC PEPTIDE: B Natriuretic Peptide: 724.5 pg/mL — ABNORMAL HIGH (ref 0.0–100.0)

## 2020-05-05 LAB — MAGNESIUM: Magnesium: 2 mg/dL (ref 1.7–2.4)

## 2020-05-05 LAB — TROPONIN I (HIGH SENSITIVITY)
Troponin I (High Sensitivity): 11 ng/L (ref <18)
Troponin I (High Sensitivity): 9 ng/L (ref <18)

## 2020-05-05 MED ORDER — DILTIAZEM HCL-DEXTROSE 125-5 MG/125ML-% IV SOLN (PREMIX): 5.0000 mg/h | INTRAVENOUS | Status: DC

## 2020-05-05 MED ORDER — DILTIAZEM LOAD VIA INFUSION: 10.0000 mg | Freq: Once | INTRAVENOUS | Status: DC

## 2020-05-05 MED ORDER — ACETAMINOPHEN 325 MG PO TABS
650.0000 mg | ORAL_TABLET | Freq: Four times a day (QID) | ORAL | Status: DC | PRN
  Filled 2020-05-05: qty 2

## 2020-05-05 MED ORDER — MORPHINE SULFATE (PF) 2 MG/ML IV SOLN
2.0000 mg | INTRAVENOUS | Status: DC | PRN
  Administered 2020-05-05 (×2): 2 mg via INTRAVENOUS
  Filled 2020-05-05 (×2): qty 1

## 2020-05-05 MED ORDER — ACETAMINOPHEN 650 MG RE SUPP: 650.0000 mg | Freq: Four times a day (QID) | RECTAL | Status: DC | PRN

## 2020-05-05 MED ORDER — SODIUM CHLORIDE 0.9 % IV SOLN: INTRAVENOUS | Status: DC

## 2020-05-05 MED ORDER — ONDANSETRON HCL 4 MG/2ML IJ SOLN
4.0000 mg | Freq: Four times a day (QID) | INTRAMUSCULAR | Status: DC | PRN
  Administered 2020-05-05 – 2020-05-09 (×4): 4 mg via INTRAVENOUS
  Filled 2020-05-05 (×5): qty 2

## 2020-05-05 MED ORDER — FUROSEMIDE 10 MG/ML IJ SOLN
40.0000 mg | Freq: Two times a day (BID) | INTRAMUSCULAR | Status: DC
  Administered 2020-05-05 – 2020-05-06 (×3): 40 mg via INTRAVENOUS
  Filled 2020-05-05 (×3): qty 4

## 2020-05-05 MED ORDER — ONDANSETRON HCL 4 MG PO TABS: 4.0000 mg | ORAL_TABLET | Freq: Four times a day (QID) | ORAL | Status: DC | PRN

## 2020-05-05 MED ORDER — DILTIAZEM LOAD VIA INFUSION
10.0000 mg | Freq: Once | INTRAVENOUS | Status: AC
  Administered 2020-05-05: 10 mg via INTRAVENOUS
  Filled 2020-05-05: qty 10

## 2020-05-05 MED ORDER — METOPROLOL SUCCINATE ER 50 MG PO TB24
50.0000 mg | ORAL_TABLET | Freq: Every day | ORAL | Status: DC
  Administered 2020-05-05 – 2020-05-06 (×2): 50 mg via ORAL
  Filled 2020-05-05 (×3): qty 1

## 2020-05-05 MED ORDER — FUROSEMIDE 10 MG/ML IJ SOLN
40.0000 mg | Freq: Once | INTRAMUSCULAR | Status: DC
  Filled 2020-05-05: qty 4

## 2020-05-05 MED ORDER — MORPHINE SULFATE (PF) 2 MG/ML IV SOLN
2.0000 mg | INTRAVENOUS | Status: DC | PRN
  Administered 2020-05-07 – 2020-05-10 (×3): 2 mg via INTRAVENOUS
  Filled 2020-05-05 (×3): qty 1

## 2020-05-05 MED ORDER — MAGNESIUM HYDROXIDE 400 MG/5ML PO SUSP: 30.0000 mL | Freq: Every day | ORAL | Status: DC | PRN

## 2020-05-05 MED ORDER — FENTANYL CITRATE (PF) 100 MCG/2ML IJ SOLN
50.0000 ug | Freq: Once | INTRAMUSCULAR | Status: AC
  Administered 2020-05-05: 50 ug via INTRAVENOUS
  Filled 2020-05-05: qty 2

## 2020-05-05 MED ORDER — LIDOCAINE 5 % EX PTCH
1.0000 | MEDICATED_PATCH | CUTANEOUS | Status: DC
  Administered 2020-05-05 – 2020-05-14 (×11): 1 via TRANSDERMAL
  Filled 2020-05-05 (×12): qty 1

## 2020-05-05 MED ORDER — ADULT MULTIVITAMIN W/MINERALS CH
1.0000 | ORAL_TABLET | Freq: Every day | ORAL | Status: DC
  Administered 2020-05-05 – 2020-05-15 (×11): 1 via ORAL
  Filled 2020-05-05 (×11): qty 1

## 2020-05-05 MED ORDER — TRAZODONE HCL 50 MG PO TABS
25.0000 mg | ORAL_TABLET | Freq: Every evening | ORAL | Status: DC | PRN
  Administered 2020-05-05 – 2020-05-13 (×6): 25 mg via ORAL
  Filled 2020-05-05 (×7): qty 1

## 2020-05-05 MED ORDER — DILTIAZEM HCL-DEXTROSE 125-5 MG/125ML-% IV SOLN (PREMIX)
5.0000 mg/h | INTRAVENOUS | Status: DC
  Administered 2020-05-05 (×2): 5 mg/h via INTRAVENOUS
  Filled 2020-05-05 (×2): qty 125

## 2020-05-05 MED ORDER — ACETAMINOPHEN 500 MG PO TABS
1000.0000 mg | ORAL_TABLET | Freq: Once | ORAL | Status: AC
  Administered 2020-05-05: 1000 mg via ORAL
  Filled 2020-05-05: qty 2

## 2020-05-05 MED ORDER — ENOXAPARIN SODIUM 40 MG/0.4ML ~~LOC~~ SOLN
40.0000 mg | SUBCUTANEOUS | Status: DC
  Administered 2020-05-05: 40 mg via SUBCUTANEOUS
  Filled 2020-05-05: qty 0.4

## 2020-05-05 MED ORDER — DABIGATRAN ETEXILATE MESYLATE 150 MG PO CAPS
150.0000 mg | ORAL_CAPSULE | Freq: Two times a day (BID) | ORAL | Status: DC
  Administered 2020-05-05 – 2020-05-15 (×20): 150 mg via ORAL
  Filled 2020-05-05 (×23): qty 1

## 2020-05-05 MED ORDER — LOPERAMIDE HCL 2 MG PO CAPS: 2.0000 mg | ORAL_CAPSULE | ORAL | Status: DC | PRN

## 2020-05-05 MED ORDER — IOHEXOL 350 MG/ML SOLN
75.0000 mL | Freq: Once | INTRAVENOUS | Status: AC | PRN
  Administered 2020-05-05: 75 mL via INTRAVENOUS

## 2020-05-05 MED ORDER — CIPROFLOXACIN HCL 500 MG PO TABS
500.0000 mg | ORAL_TABLET | Freq: Two times a day (BID) | ORAL | Status: DC
  Filled 2020-05-05 (×4): qty 1

## 2020-05-05 NOTE — ED Notes (Signed)
Pt states that back pain started about 6 weeks ago. R leg started hurting today. (ankle and foot area). Does not appear red, swollen or tender to touch.

## 2020-05-05 NOTE — ED Triage Notes (Signed)
Pt to ED via EMS from home. Per EMS, pt c/o of chronic back pain. Pt was dx with arthritis, but is not on any pain medications. Pt has hx of fib as well. Per EMS, HR ranging from 60s-170s; BP 182/88.

## 2020-05-05 NOTE — Progress Notes (Signed)
Patient ID: LILE MCCURLEY, female   DOB: 28-Nov-1924, 88 y.o.   MRN: [id number] This is a no charge note as patient was admitted this AM.  Patient seen and examined and chart reviewed. Daughter at bedside.  Patient was given pain medicine currently is sleepy.  Further details for today given by daughter to me.  Per HPI: GWYNN CHALKER is a 87 y.o. Caucasian female with medical history significant for atrial fibrillation on Pradaxa and von Willebrand disease, who presented to the emergency room with acute onset of low back pain for which she was recently seen by Dr. Martha Clan and prescribed Robaxin with mild help.  She has been having urinary frequency and urgency and felt to have a UTI.  She was seen by her cardiologist and given p.o. Cipro.   Daughter reports patient was complaining of right lower extremity pain today around the cath.  Also daughter states that her back pain has progressively worsened. I did go over imaging with the daughter.  Pt sleepy. nad cta  Reg-irreg, no gallop s1/s2 Soft benign  No edema or calf tenderness  A/p: Discussed case with ortho Dr. Okey Dupre about pt's back pain, rec. Neurosurgery consult- will defer imaging until neurosurgery evaluates pt. Consulted neurosurgery Will obtain RLE doppler US for Rt LE pain to r/o dvt>>>NEGATIVE

## 2020-05-05 NOTE — ED Notes (Signed)
Pt showing 83% oxygen on monitor. Writer in room to assess with Joylene Igo, MD at bedside. Pt on room air and writer placed nasal cannula on pt with initiation of 3L O2.

## 2020-05-05 NOTE — ED Provider Notes (Addendum)
[facility] Emergency Department Provider Note  ____________________________________________   Event Date/Time   First MD Initiated Contact with Patient 05/05/20 0031     (approximate)  I have reviewed the triage vital signs and the nursing notes.   HISTORY  Chief Complaint Back Pain   HPI Tracy Singh is a 87 y.o. female with a past medical history of paroxysmal A. fib anticoagulated on Pradaxa and rate controlled on metoprolol, von Willebrand's disease, HTN, and fairly severe arthritis who presents via EMS from home for assessment of worsening back pain over the last 2 weeks.  Patient adamantly denies any injuries or falls.  He denies any headache and earache, sore throat, chest pain, cough, shortness of breath, Donnell pain, vomiting, diarrhea, dysuria or clear alleviating aggravating factors.  He states she was told this is from her arthritis although is not taking any pain medicine for this time.  No other acute concerns at this time.         Past Medical History:  Diagnosis Date  . A-fib (HCC)   . Sepsis (HCC) 03/21/2018  . Von Willebrand disease [facility])     Patient Active Problem List   Diagnosis Date Noted  . Atrial fibrillation with rapid ventricular response (HCC) 05/05/2020  . Diarrhea, functional 05/15/2018  . Atrial fibrillation, chronic (HCC) 03/10/2015  . Essential hypertension 10/06/2013  . Von Willebrand disease (HCC) 10/06/2013    Past Surgical History:  Procedure Laterality Date  . ABDOMINAL HYSTERECTOMY      Prior to Admission medications   Medication Sig Start Date End Date Taking? Authorizing Provider  ciprofloxacin (CIPRO) 500 MG tablet Take 500 mg by mouth 2 (two) times daily. 05/04/20  Yes [provider]  hydrochlorothiazide (HYDRODIURIL) 25 MG tablet Take 1 tablet by mouth daily. 04/17/20  Yes [provider]  lisinopril (PRINIVIL,ZESTRIL) 5 MG tablet Take 5 mg by mouth daily. 03/24/16  Yes  [provider]  loperamide (IMODIUM) 2 MG capsule Take by mouth as needed for diarrhea or loose stools.   Yes [provider]  metoprolol succinate (TOPROL-XL) 50 MG 24 hr tablet Take 50 mg by mouth daily. 03/24/16  Yes [provider]  Multiple Vitamin (MULTIVITAMIN) capsule Take 1 capsule by mouth daily.   Yes [provider]  PRADAXA 150 MG CAPS capsule Take 150 mg by mouth. bid 02/23/18  Yes [provider]    Allergies Red dye and Yellow dyes (non-tartrazine)  Family History  Problem Relation Age of Onset  . Bladder Cancer Father   . Cancer Brother     Social History Social History   Tobacco Use  . Smoking status: Former Smoker    Packs/day: 0.05    Years: 6.00    Pack years: 0.30    Types: Cigarettes    Quit date: 36    Years since quitting: 65.3  . Smokeless tobacco: Never Used  . Tobacco comment: 1 pack every 2 weeks  Vaping Use  . Vaping Use: Never used  Substance Use Topics  . Alcohol use: Never  . Drug use: Never    Review of Systems  Review of Systems  Constitutional: Negative for chills and fever.  HENT: Negative for sore throat.   Eyes: Negative for pain.  Respiratory: Negative for cough and stridor.   Cardiovascular: Negative for chest pain.  Gastrointestinal: Negative for vomiting.  Genitourinary: Negative for dysuria.  Musculoskeletal: Positive for back pain and joint pain ( "all my joints hurt").  Skin:  Negative for rash.  Neurological: Negative for seizures, loss of consciousness and headaches.  Psychiatric/Behavioral: Negative for suicidal ideas.  All other systems reviewed and are negative.     ____________________________________________   PHYSICAL EXAM:  VITAL SIGNS: ED Triage Vitals  Enc Vitals Group     BP      Pulse      Resp      Temp      Temp src      SpO2      Weight      Height      Head Circumference      Peak Flow      Pain Score      Pain Loc      Pain Edu?       Excl. in GC?    Vitals:   05/05/20 0215 05/05/20 0230  BP: 123/80 129/67  Pulse: (!) 102 99  Resp: 18 15  SpO2: 92% 96%   Physical Exam Vitals and nursing note reviewed.  Constitutional:      General: She is not in acute distress.    Appearance: She is well-developed.  HENT:     Head: Normocephalic and atraumatic.     Right Ear: External ear normal.     Left Ear: External ear normal.     Nose: Nose normal.  Eyes:     Conjunctiva/sclera: Conjunctivae normal.  Cardiovascular:     Rate and Rhythm: Tachycardia present. Rhythm irregular.     Heart sounds: No murmur heard.   Pulmonary:     Effort: Pulmonary effort is normal. No respiratory distress.     Breath sounds: Normal breath sounds.  Abdominal:     Palpations: Abdomen is soft.     Tenderness: There is no abdominal tenderness.  Musculoskeletal:     Cervical back: Neck supple.     Right lower leg: Edema present.     Left lower leg: Edema present.  Skin:    General: Skin is warm and dry.  Neurological:     Mental Status: She is alert.  Psychiatric:        Mood and Affect: Mood normal.     There is some tenderness over the T and L-spine.  Patient is able to lift her heels off the bed on command but is otherwise not able to significantly flex her knees.  She is able to move her toes on command.  She has symmetric strength in her upper extremities.  No tenderness step-offs deformities over the C-spine.  Cranial nerves II through XII grossly intact.  2+ bilateral radial and DP pulses. ____________________________________________   LABS (all labs ordered are listed, but only abnormal results are displayed)  Labs Reviewed  COMPREHENSIVE METABOLIC PANEL - Abnormal; Notable for the following components:      Result Value   Sodium 133 (*)    Glucose, Bld 148 (*)    All other components within normal limits  PROTIME-INR - Abnormal; Notable for the following components:   Prothrombin Time 20.3 (*)    INR 1.8 (*)    All  other components within normal limits  BRAIN NATRIURETIC PEPTIDE - Abnormal; Notable for the following components:   B Natriuretic Peptide 724.5 (*)    All other components within normal limits  RESP PANEL BY RT-PCR (FLU A&B, COVID) ARPGX2  CBC WITH DIFFERENTIAL/PLATELET  MAGNESIUM  URINALYSIS, COMPLETE (UACMP) WITH MICROSCOPIC  TROPONIN I (HIGH SENSITIVITY)  TROPONIN I (HIGH SENSITIVITY)   ____________________________________________  EKG  A.  fib with a rapid ventricular rate at 150, normal axis, no clear evidence of acute ischemia. ____________________________________________  RADIOLOGY  ED MD interpretation: Chest x-ray has no overt pulmonary edema, no thorax, pneumonia, rib fracture or other clear acute intrathoracic process.  Plain film of the L-spine shows L2 age-indeterminate compression fracture.  Plain film of the T-spine is unremarkable.  Official radiology report(s): DG Thoracic Spine 2 View  Result Date: 05/05/2020 CLINICAL DATA:  Back pain EXAM: THORACIC SPINE 2 VIEWS COMPARISON:  None. FINDINGS: The osseous structures are diffusely osteopenic. Normal thoracic kyphosis. No listhesis. No acute fracture of the thoracic spine. Vertebral body height appears preserved. The paraspinal soft tissues are unremarkable. L2 superior endplate fracture again noted. IMPRESSION: No definite acute fracture or listhesis of the thoracic spine. Electronically Signed   By: Helyn Numbers MD   On: 05/05/2020 02:34   DG Lumbar Spine Complete  Result Date: 05/05/2020 CLINICAL DATA:  Back pain EXAM: LUMBAR SPINE - COMPLETE 4+ VIEW COMPARISON:  None. FINDINGS: Five view radiograph lumbar spine demonstrates normal lumbar lordosis. There is grade 1 anterolisthesis of L4 upon L5. Superior endplate fracture of L2 is noted, age indeterminate, with approximately 30-40% loss of height. This appears new since prior chest radiograph of 04/01/2018. Remaining vertebral body height has been preserved. There is  intervertebral disc space narrowing at L4-5 in keeping with changes of moderate degenerative disc disease at this level. Remaining intervertebral disc heights are preserved. The paraspinal soft tissues are unremarkable. Vascular calcifications noted within the abdominal aorta. Cholelithiasis is incidentally noted. IMPRESSION: Age-indeterminate L2 superior endplate fracture with approximately 30-40% loss of height. Moderate degenerative disc disease L4-5 with cystic grade 1 anterolisthesis. Cholelithiasis Electronically Signed   By: Helyn Numbers MD   On: 05/05/2020 02:31   DG Chest Portable 1 View  Result Date: 05/05/2020 CLINICAL DATA:  Tachycardia EXAM: PORTABLE CHEST 1 VIEW COMPARISON:  04/01/2018 FINDINGS: Background interstitial prominence is again identified likely reflecting changes of underlying chronic interstitial lung disease. No superimposed confluent pulmonary infiltrates. No pneumothorax or pleural effusion. Biapical pleural scarring again noted, unchanged. Mild cardiomegaly is stable. The pulmonary vascularity is normal. No acute bone abnormality. IMPRESSION: Stable cardiomegaly. No radiographic evidence of acute cardiopulmonary disease. Electronically Signed   By: Helyn Numbers MD   On: 05/05/2020 02:28    ____________________________________________   PROCEDURES  Procedure(s) performed (including Critical Care):  .Critical Care Performed by: Gilles Chiquito, MD Authorized by: Gilles Chiquito, MD   Critical care provider statement:    Critical care time (minutes):  45   Critical care time was exclusive of:  Separately billable procedures and treating other patients   Critical care was necessary to treat or prevent imminent or life-threatening deterioration of the following conditions:  Cardiac failure   Critical care was time spent personally by me on the following activities:  Discussions with consultants, evaluation of patient's response to treatment, examination of  patient, ordering and performing treatments and interventions, ordering and review of laboratory studies, ordering and review of radiographic studies, pulse oximetry, re-evaluation of patient's condition, obtaining history from patient or surrogate and review of old charts     ____________________________________________   INITIAL IMPRESSION / ASSESSMENT AND PLAN / ED COURSE      Patient presents with above-stated exam for assessment approximately 2 weeks of worsening mid and lower back pain.  On arrival she is hypertensive with a BP of 186/132 and tachycardic in the 150s to 160s.  She is noted to be  in A. fib and RVR.  On exam she has some tenderness in her lower extremities and is weak in the bilateral hips and knees but otherwise seems neurovascularly intact in her extremities.  She denies any history of recent trauma and there is no evidence of trauma on exam.  Patient started on dill drip after giving a small bolus with appropriate rate control achieved.  Suspect this is likely pain related patient denies any chest pain troponin is nonelevated and very low suspicion for ACS.  Potassium and exam are within normal limits is not acutely anemic.  Chest x-ray has no other acute abnormalities.  BNP is slightly elevated suspect is related to the tachycardia and small amount of fluid buildup while patient was in RVR.  CBC otherwise shows no significant derangements.  CMP shows no significant electrolyte or metabolic derangements.  Covid is negative.  Patient given below noted analgesia.  Given she is in A. fib with RVR on dill drip will plan to admit to medicine service for further evaluation management.  Suspect this may be related to her pain from her L2 compression fracture which I suspect is subacute given she is tender this area.      ____________________________________________   FINAL CLINICAL IMPRESSION(S) / ED DIAGNOSES  Final diagnoses:  Atrial fibrillation with RVR (HCC)  Closed  compression fracture of L2 lumbar vertebra, initial encounter (HCC)    Medications  lidocaine (LIDODERM) 5 % 1 patch (1 patch Transdermal Patch Applied 05/05/20 0057)  diltiazem (CARDIZEM) 1 mg/mL load via infusion 10 mg (10 mg Intravenous Bolus from Bag 05/05/20 0129)    And  diltiazem (CARDIZEM) 125 mg in dextrose 5% 125 mL (1 mg/mL) infusion (5 mg/hr Intravenous New Bag/Given 05/05/20 0203)  metoprolol succinate (TOPROL-XL) 24 hr tablet 50 mg (50 mg Oral Patient Refused/Not Given 05/05/20 0228)  acetaminophen (TYLENOL) tablet 1,000 mg (1,000 mg Oral Given 05/05/20 0054)  fentaNYL (SUBLIMAZE) injection 50 mcg (50 mcg Intravenous Given 05/05/20 0052)     ED Discharge Orders    None       Note:  This document was prepared using Dragon voice recognition software and may include unintentional dictation errors.   Gilles ChiquitoSmith, Nataliah Hatlestad P, MD 05/05/20 0302    Gilles ChiquitoSmith, Jeff Frieden P, MD 05/05/20 [phone]

## 2020-05-05 NOTE — H&P (Signed)
[place]   PATIENT NAME: Tracy Singh    MR#:  [id number]  DATE OF BIRTH:  Jul 18, 1924  DATE OF ADMISSION:  05/05/2020  PRIMARY CARE PHYSICIAN: Reubin Milan, MD   Patient is coming from: Home.  REQUESTING/REFERRING PHYSICIAN: Antoine Primas, MD  CHIEF COMPLAINT:   Chief Complaint  Patient presents with  . Back Pain    HISTORY OF PRESENT ILLNESS:  Tracy Singh is a 87 y.o. Caucasian female with medical history significant for atrial fibrillation on Pradaxa and von Willebrand disease, who presented to the emergency room with acute onset of low back pain for which she was recently seen by Dr. Martha Clan and prescribed Robaxin with mild help.  She has been having urinary frequency and urgency and felt to have a UTI.  She was seen by her cardiologist and given p.o. Cipro.  She denies any significant palpitations or chest pain.  No nausea or vomiting or abdominal pain.  She denies any dyspnea or cough or wheezing.  She does have bilateral lower extremity edema.  Her pulse extremities dropped to 85% on room air in the ER and she was placed on 2 L of O2 by nasal cannula. ED Course: Upon presentation to the emergency room blood pressure was 186/152 with a heart rate of 137 and she was in rapid atrial fibrillation.  Labs revealed mild hyponatremia.BNP was 724.5 and high-sensitivity troponin I was 11 and later 9.  Influenza antigens and COVID-19 PCR came back negative   EKG as reviewed by me : Showed atrial fibrillation with a rate of 150 heart rate progress Imaging: Portable chest ray showed stable cardiomegaly with no acute cardiopulmonary disease.  Lumbar spine x-ray showed L2 superior endplate fracture with approximately 30 to 40% loss of height and moderate degenerative disc disease at L4-L5 with cystic grade 1 anterolisthesis as well as cholelithiasis.  T-spine x-ray showed no acute abnormalities.  The patient was given 1 g of p.o. Tylenol, 10 mg IV Cardizem followed  by IV Cardizem drip and 50 mcg of IV fentanyl as well as 5% Lidoderm patch and Toprol-XL 50 mg p.o.  Her heart rate is down to 91.  She will be admitted to a progressive cardiac unit bed for further evaluation and management. PAST MEDICAL HISTORY:   Past Medical History:  Diagnosis Date  . A-fib (HCC)   . Sepsis (HCC) 03/21/2018  . Von Willebrand disease (HCC)     PAST SURGICAL HISTORY:   Past Surgical History:  Procedure Laterality Date  . ABDOMINAL HYSTERECTOMY      SOCIAL HISTORY:   Social History   Tobacco Use  . Smoking status: Former Smoker    Packs/day: 0.05    Years: 6.00    Pack years: 0.30    Types: Cigarettes    Quit date: 70    Years since quitting: 65.3  . Smokeless tobacco: Never Used  . Tobacco comment: 1 pack every 2 weeks  Substance Use Topics  . Alcohol use: Never    FAMILY HISTORY:   Family History  Problem Relation Age of Onset  . Bladder Cancer Father   . Cancer Brother     DRUG ALLERGIES:   Allergies  Allergen Reactions  . Red Dye Swelling  . Yellow Dyes (Non-Tartrazine) Swelling    REVIEW OF SYSTEMS:   ROS As per history of present illness. All pertinent systems were reviewed above. Constitutional, HEENT, cardiovascular, respiratory, GI, GU, musculoskeletal, neuro, psychiatric, endocrine, integumentary and hematologic systems  were reviewed and are otherwise negative/unremarkable except for positive findings mentioned above in the HPI.   MEDICATIONS AT HOME:   Prior to Admission medications   Medication Sig Start Date End Date Taking? Authorizing Provider  ciprofloxacin (CIPRO) 500 MG tablet Take 500 mg by mouth 2 (two) times daily. 05/04/20  Yes [provider]  hydrochlorothiazide (HYDRODIURIL) 25 MG tablet Take 1 tablet by mouth daily. 04/17/20  Yes [provider]  lisinopril (PRINIVIL,ZESTRIL) 5 MG tablet Take 5 mg by mouth daily. 03/24/16  Yes [provider]  loperamide (IMODIUM) 2 MG capsule Take by  mouth as needed for diarrhea or loose stools.   Yes [provider]  metoprolol succinate (TOPROL-XL) 50 MG 24 hr tablet Take 50 mg by mouth daily. 03/24/16  Yes [provider]  Multiple Vitamin (MULTIVITAMIN) capsule Take 1 capsule by mouth daily.   Yes [provider]  PRADAXA 150 MG CAPS capsule Take 150 mg by mouth. bid 02/23/18  Yes [provider]      VITAL SIGNS:  Blood pressure 129/67, pulse 99, resp. rate 15, height 5\' 4"  (1.626 m), weight 67.6 kg, SpO2 96 %.  PHYSICAL EXAMINATION:  Physical Exam  GENERAL:  87 y.o.-year-old Caucasian female patient lying in the bed with no acute distress.  EYES: Pupils equal, round, reactive to light and accommodation. No scleral icterus. Extraocular muscles intact.  HEENT: Head atraumatic, normocephalic. Oropharynx and nasopharynx clear.  NECK:  Supple, no jugular venous distention. No thyroid enlargement, no tenderness.  LUNGS: Normal breath sounds bilaterally, no wheezing, rales,rhonchi or crepitation. No use of accessory muscles of respiration.  CARDIOVASCULAR: Irregular irregular tachycardic rhythm S1, S2 normal. No murmurs, rubs, or gallops.  ABDOMEN: Soft, nondistended, nontender. Bowel sounds present. No organomegaly or mass.  EXTREMITIES: No pedal edema, cyanosis, or clubbing.  NEUROLOGIC: Cranial nerves II through XII are intact. Muscle strength 5/5 in all extremities. Sensation intact. Gait not checked. Musculoskeletal: Tenderness at L2 level. PSYCHIATRIC: The patient is alert and oriented x 3.  Normal affect and good eye contact. SKIN: No obvious rash, lesion, or ulcer.   LABORATORY PANEL:   CBC Recent Labs  Lab 05/05/20 0043  WBC 7.0  HGB 14.1  HCT 41.1  PLT 279   ------------------------------------------------------------------------------------------------------------------  Chemistries  Recent Labs  Lab 05/05/20 0043 05/05/20 0045  NA 133*  --   K 4.1  --   CL 100  --   CO2 23   --   GLUCOSE 148*  --   BUN 16  --   CREATININE 0.69  --   CALCIUM 9.2  --   MG  --  2.0  AST 26  --   ALT 15  --   ALKPHOS 83  --   BILITOT 0.8  --    ------------------------------------------------------------------------------------------------------------------  Cardiac Enzymes No results for input(s): TROPONINI in the last 168 hours. ------------------------------------------------------------------------------------------------------------------  RADIOLOGY:  DG Thoracic Spine 2 View  Result Date: 05/05/2020 CLINICAL DATA:  Back pain EXAM: THORACIC SPINE 2 VIEWS COMPARISON:  None. FINDINGS: The osseous structures are diffusely osteopenic. Normal thoracic kyphosis. No listhesis. No acute fracture of the thoracic spine. Vertebral body height appears preserved. The paraspinal soft tissues are unremarkable. L2 superior endplate fracture again noted. IMPRESSION: No definite acute fracture or listhesis of the thoracic spine. Electronically Signed   By: Helyn NumbersAshesh  Parikh MD   On: 05/05/2020 02:34   DG Lumbar Spine Complete  Result Date: 05/05/2020 CLINICAL DATA:  Back pain EXAM: LUMBAR SPINE -  COMPLETE 4+ VIEW COMPARISON:  None. FINDINGS: Five view radiograph lumbar spine demonstrates normal lumbar lordosis. There is grade 1 anterolisthesis of L4 upon L5. Superior endplate fracture of L2 is noted, age indeterminate, with approximately 30-40% loss of height. This appears new since prior chest radiograph of 04/01/2018. Remaining vertebral body height has been preserved. There is intervertebral disc space narrowing at L4-5 in keeping with changes of moderate degenerative disc disease at this level. Remaining intervertebral disc heights are preserved. The paraspinal soft tissues are unremarkable. Vascular calcifications noted within the abdominal aorta. Cholelithiasis is incidentally noted. IMPRESSION: Age-indeterminate L2 superior endplate fracture with approximately 30-40% loss of height. Moderate  degenerative disc disease L4-5 with cystic grade 1 anterolisthesis. Cholelithiasis Electronically Signed   By: Helyn Numbers MD   On: 05/05/2020 02:31   DG Chest Portable 1 View  Result Date: 05/05/2020 CLINICAL DATA:  Tachycardia EXAM: PORTABLE CHEST 1 VIEW COMPARISON:  04/01/2018 FINDINGS: Background interstitial prominence is again identified likely reflecting changes of underlying chronic interstitial lung disease. No superimposed confluent pulmonary infiltrates. No pneumothorax or pleural effusion. Biapical pleural scarring again noted, unchanged. Mild cardiomegaly is stable. The pulmonary vascularity is normal. No acute bone abnormality. IMPRESSION: Stable cardiomegaly. No radiographic evidence of acute cardiopulmonary disease. Electronically Signed   By: Helyn Numbers MD   On: 05/05/2020 02:28      IMPRESSION AND PLAN:  Active Problems:   Atrial fibrillation with rapid ventricular response (HCC)  1.  Atrial fibrillation with rapid ventricular response. -The patient will be admitted to a progressive cardiac unit bed. -We will continue her IV Cardizem drip. -We will follow serial troponin aspirin -Cardiology consult will be obtained. -I notified Dr. Darrold Junker about the patient.  2.  L2 compression fracture possibly pathological secondary to osteoporosis. -Pain management will be provided at this time. -Vertebroplasty/kyphoplasty can later be entertained with inadequate pain control. -PT consult will be obtained.  3.  Acute respiratory failure with hypoxia, likely secondary to acute CHF. -We will obtain a 2D echo and a cardiology consultation as mentioned above. -She will be diuresed with IV Lasix. -We will follow serial troponin I's. -This could be secondary to her atrial fibrillation. -O2 protocol will be followed.  4.  Hypertensive urgency, likely contributing to her acute CHF. -We will continue her antihypertensives and place her on as needed IV labetalol.  5.  Recent UTI  status post treatment with Cipro. -Urinalysis currently negative.  She finished taking Cipro.  DVT prophylaxis: We will continue Pradaxa.  Code Status: This was discussed with the patient and she desires to be full code. Family Communication:  The plan of care was discussed in details with the patient (and her daughter who was with her in the ER.). I answered all questions. The patient agreed to proceed with the above mentioned plan. Further management will depend upon hospital course. Disposition Plan: Back to previous home environment Consults called: Cardiology consult.  All the records are reviewed and case discussed with ED provider.  Status is: Inpatient  Remains inpatient appropriate because:Ongoing diagnostic testing needed not appropriate for outpatient work up, Unsafe d/c plan, IV treatments appropriate due to intensity of illness or inability to take PO and Inpatient level of care appropriate due to severity of illness   Dispo: The patient is from: Home              Anticipated d/c is to: Home              Patient currently is  not medically stable to d/c.   Difficult to place patient No   TOTAL TIME TAKING CARE OF THIS PATIENT: 55 minutes.    Hannah Beat M.D on 05/05/2020 at 3:00 AM  [facility]   From 7 PM-7 AM, contact night-coverage [email or web address]  CC: Primary care physician; Reubin Milan, MD

## 2020-05-05 NOTE — Consult Note (Signed)
CARDIOLOGY CONSULT NOTE               Patient ID: Tracy Singh MRN: [id number] DOB/AGE: 08-09-1924 87 y.o.  Admit date: 05/05/2020 Referring Physician [facility] Primary Physician  Reubin Milan, MD  Primary Cardiologist Fath Reason for Consultation atrial fibrillation, acute on chronic CHF  HPI: 87 year old female referred for evaluation of atrial fibrillation with rapid ventricular response and acute on chronic CHF. The patient has a history of chronic atrial fibrillation on metoprolol and Pradaxa, von Willebrand disease, COPD, and hypertension. The patient's daughter, Tracy Singh, provides most of the history due to the patient being in significant pain and hard of hearing. The patient has been more sedentary in the last year due to knee pain, but still goes to water aerobics at the gym regularly. About 6 weeks ago, the patient began complaining of low back pain, which improved temporarily with muscle relaxant. She was later treated for a UTI, felt to be possibly contributing to her back pain. Due to the intensity of low back pain and decreased ability to ambulate, the patient's daughter called EMS last night. Upon arrival, the patient was noted to be in rapid atrial fibrillation, hypertensive, and hypoxic at 85% on room air, received IV Cardizem, and then started on Cardizem drip. Lumbar spine xray showed an age-indeterminate L2 fracture. Chest xray showed chronic interstitial lung disease, no pulmonary infiltrates, effusions, or abnormal pulmonary vascularity. Chest CTA showed no evidence of PE, with severe biatrial cardiomegaly, and coronary and aortic atherosclerosis. Labs are notable for no evidence of anemia, sodium 133, BUN 16, creatinine 0.69, BNP 724, high sensitivity troponin 11 and 9. The patient's daughter states that the patient has not been notably short of breath recently, complaining of chest pain, or shortness of breath. She has not been able to lie flat in bed in quite some  time due to pain but after receiving morphine, is currently lying flat in bed. She has had recent peripheral edema.  Review of systems complete and found to be negative unless listed above     Past Medical History:  Diagnosis Date  . A-fib (HCC)   . Sepsis (HCC) 03/21/2018  . Von Willebrand disease (HCC)     Past Surgical History:  Procedure Laterality Date  . ABDOMINAL HYSTERECTOMY      (Not in a hospital admission)  Social History   Socioeconomic History  . Marital status: Divorced    Spouse name: Not on file  . Number of children: 3  . Years of education: Not on file  . Highest education level: Not on file  Occupational History  . Not on file  Tobacco Use  . Smoking status: Former Smoker    Packs/day: 0.05    Years: 6.00    Pack years: 0.30    Types: Cigarettes    Quit date: 60    Years since quitting: 65.3  . Smokeless tobacco: Never Used  . Tobacco comment: 1 pack every 2 weeks  Vaping Use  . Vaping Use: Never used  Substance and Sexual Activity  . Alcohol use: Never  . Drug use: Never  . Sexual activity: Never  Other Topics Concern  . Not on file  Social History Narrative  . Not on file   Social Determinants of Health   Financial Resource Strain: Not on file  Food Insecurity: Not on file  Transportation Needs: Not on file  Physical Activity: Not on file  Stress: Not on file  Social Connections: Not on  file  Intimate Partner Violence: Not on file    Family History  Problem Relation Age of Onset  . Bladder Cancer Father   . Cancer Brother       Review of systems complete and found to be negative unless listed above      PHYSICAL EXAM  General: Well developed, well nourished, lying flat in bed, writhing in pain intermittently HEENT:  Normocephalic and atramatic Neck:  No JVD.  Lungs: normal effort of breathing on room air, no wheezing, no increased respiratory effort Heart: irregularly irregular, without gallops or murmurs.  Abdomen:  no obvious distention Extremities: No clubbing, cyanosis or edema.   Neuro: Alert and oriented X 3. Psych:  Good affect, responds appropriately  Labs:   Lab Results  Component Value Date   WBC 9.0 05/05/2020   HGB 13.7 05/05/2020   HCT 40.3 05/05/2020   MCV 92.6 05/05/2020   PLT 266 05/05/2020    Recent Labs  Lab 05/05/20 0043 05/05/20 0517  NA 133* 133*  K 4.1 3.9  CL 100 99  CO2 23 25  BUN 16 13  CREATININE 0.69 0.60  CALCIUM 9.2 8.8*  PROT 7.5  --   BILITOT 0.8  --   ALKPHOS 83  --   ALT 15  --   AST 26  --   GLUCOSE 148* 151*   Lab Results  Component Value Date   TROPONINI <0.03 04/01/2018   No results found for: CHOL No results found for: HDL No results found for: LDLCALC No results found for: TRIG No results found for: CHOLHDL No results found for: LDLDIRECT    Radiology: DG Thoracic Spine 2 View  Result Date: 05/05/2020 CLINICAL DATA:  Back pain EXAM: THORACIC SPINE 2 VIEWS COMPARISON:  None. FINDINGS: The osseous structures are diffusely osteopenic. Normal thoracic kyphosis. No listhesis. No acute fracture of the thoracic spine. Vertebral body height appears preserved. The paraspinal soft tissues are unremarkable. L2 superior endplate fracture again noted. IMPRESSION: No definite acute fracture or listhesis of the thoracic spine. Electronically Signed   By: Helyn Numbers MD   On: 05/05/2020 02:34   DG Lumbar Spine Complete  Result Date: 05/05/2020 CLINICAL DATA:  Back pain EXAM: LUMBAR SPINE - COMPLETE 4+ VIEW COMPARISON:  None. FINDINGS: Five view radiograph lumbar spine demonstrates normal lumbar lordosis. There is grade 1 anterolisthesis of L4 upon L5. Superior endplate fracture of L2 is noted, age indeterminate, with approximately 30-40% loss of height. This appears new since prior chest radiograph of 04/01/2018. Remaining vertebral body height has been preserved. There is intervertebral disc space narrowing at L4-5 in keeping with changes of moderate  degenerative disc disease at this level. Remaining intervertebral disc heights are preserved. The paraspinal soft tissues are unremarkable. Vascular calcifications noted within the abdominal aorta. Cholelithiasis is incidentally noted. IMPRESSION: Age-indeterminate L2 superior endplate fracture with approximately 30-40% loss of height. Moderate degenerative disc disease L4-5 with cystic grade 1 anterolisthesis. Cholelithiasis Electronically Signed   By: Helyn Numbers MD   On: 05/05/2020 02:31   CT Angio Chest PE W and/or Wo Contrast  Result Date: 05/05/2020 CLINICAL DATA:  87 year old female with low back pain and hypoxia. EXAM: CT ANGIOGRAPHY CHEST WITH CONTRAST TECHNIQUE: Multidetector CT imaging of the chest was performed using the standard protocol during bolus administration of intravenous contrast. Multiplanar CT image reconstructions and MIPs were obtained to evaluate the vascular anatomy. CONTRAST:  Seventy-five mL Omnipaque 350, intravenous COMPARISON:  01/25/2010 FINDINGS: Cardiovascular: Satisfactory opacification of the  pulmonary arteries to the segmental level. No evidence of pulmonary embolism. Severe biatrial cardiomegaly, advanced from 2012 comparison. No pericardial effusion. Aortic atherosclerotic calcifications. Coronary atherosclerotic calcifications. Mediastinum/Nodes: No enlarged mediastinal, hilar, or axillary lymph nodes. Thyroid gland, trachea, and esophagus demonstrate no significant findings. Lungs/Pleura: Bibasilar subsegmental atelectasis. Trace bilateral pleural effusions versus dependent pleural thickening. Mild centrilobular emphysema. Similar appearing apical scarring with calcification. No suspicious pulmonary nodules. No pneumothorax. Upper Abdomen: No acute abnormality. Similar appearing multifocal simple hepatic cysts. Similar appearing simple cyst arising from the medial cortex of the left upper pole kidney. Small hiatal hernia. Musculoskeletal: No acute fracture. No  aggressive appearing osseous lesion. Diffuse osteopenia. Review of the MIP images confirms the above findings. IMPRESSION: Vascular: 1. No evidence of pulmonary embolism. 2. Severe biatrial cardiomegaly. 3. Coronary and aortic atherosclerosis (ICD10-I70.0). Non-Vascular: Bibasilar subsegmental atelectasis. Marliss Coots, MD [facility] Radiology Electronically Signed   By: Marliss Coots MD   On: 05/05/2020 07:57   DG Chest Portable 1 View  Result Date: 05/05/2020 CLINICAL DATA:  Tachycardia EXAM: PORTABLE CHEST 1 VIEW COMPARISON:  04/01/2018 FINDINGS: Background interstitial prominence is again identified likely reflecting changes of underlying chronic interstitial lung disease. No superimposed confluent pulmonary infiltrates. No pneumothorax or pleural effusion. Biapical pleural scarring again noted, unchanged. Mild cardiomegaly is stable. The pulmonary vascularity is normal. No acute bone abnormality. IMPRESSION: Stable cardiomegaly. No radiographic evidence of acute cardiopulmonary disease. Electronically Signed   By: Helyn Numbers MD   On: 05/05/2020 02:28    EKG: atrial fibrillation with RVR  ASSESSMENT AND PLAN:  1. Atrial fibrillation with RVR, with a history of chronic atrial fibrillation on Pradaxa and metoprolol with no missed doses; rate is likely compensatory in setting of acute low back pain. She is currently on Cardizem drip with improvement of rates to 80-90s. 2. L2 compression fracture 3. Acute on chronic CHF, previous echo in 2018 showed; currently on IV Lasix 4. Hypertensive urgency, improved  Recommendations: 1. Continue Cardizem drip for now until pain is well controlled 2. Continue IV Lasix with careful monitoring of renal status 3. Pain control 4. Await orthopedics recommendations 5. 2D echocardiogram 6. Continue Pradaxa for stroke prevention 7. Further recommendations pending patient's initial course  Signed: Rockie Neighbours 05/05/2020, 8:51 AM

## 2020-05-05 NOTE — ED Notes (Signed)
Pt with 10/10 right leg pain. Pt repositioned with some relief.

## 2020-05-05 NOTE — ED Notes (Signed)
Moved to room 33.  Daughter at bedside and assisting pateint to eat.  pateint is alert.  Her purewick is working.  She is in a fib at 100/min.  IV site is okay.

## 2020-05-06 ENCOUNTER — Inpatient Hospital Stay: Payer: Medicare HMO

## 2020-05-06 ENCOUNTER — Inpatient Hospital Stay
Admit: 2020-05-06 | Discharge: 2020-05-06 | Disposition: A | Discharge status: Home / Self Care | Payer: Medicare HMO | Attending: Family Medicine | Admitting: Family Medicine

## 2020-05-06 DIAGNOSIS — I10 Essential (primary) hypertension: Secondary | ICD-10-CM

## 2020-05-06 DIAGNOSIS — N390 Urinary tract infection, site not specified: Secondary | ICD-10-CM

## 2020-05-06 LAB — ECHOCARDIOGRAM COMPLETE
AR max vel: 2.04 cm2
AV Area VTI: 2.06 cm2
AV Area mean vel: 2.13 cm2
AV Mean grad: 3 mmHg
AV Peak grad: 6.3 mmHg
Ao pk vel: 1.25 m/s
Area-P 1/2: 3.97 cm2
Height: 64 in
MV VTI: 1.77 cm2
S' Lateral: 2.9 cm
Weight: 2384 oz

## 2020-05-06 LAB — BASIC METABOLIC PANEL
Anion gap: 11 (ref 5–15)
BUN: 14 mg/dL (ref 8–23)
CO2: 30 mmol/L (ref 22–32)
Calcium: 9.3 mg/dL (ref 8.9–10.3)
Chloride: 91 mmol/L — ABNORMAL LOW (ref 98–111)
Creatinine, Ser: 0.85 mg/dL (ref 0.44–1.00)
GFR, Estimated: >60 mL/min (ref >60)
Glucose, Bld: 110 mg/dL — ABNORMAL HIGH (ref 70–99)
Potassium: 3.8 mmol/L (ref 3.5–5.1)
Sodium: 132 mmol/L — ABNORMAL LOW (ref 135–145)

## 2020-05-06 MED ORDER — FUROSEMIDE 10 MG/ML IJ SOLN
20.0000 mg | Freq: Two times a day (BID) | INTRAMUSCULAR | Status: DC
Start: 2020-05-06 — End: 2020-05-07
  Administered 2020-05-06 – 2020-05-07 (×2): 20 mg via INTRAVENOUS
  Filled 2020-05-06 (×2): qty 2

## 2020-05-06 MED ORDER — DILTIAZEM HCL 30 MG PO TABS
30.0000 mg | ORAL_TABLET | Freq: Four times a day (QID) | ORAL | Status: DC
  Administered 2020-05-06 – 2020-05-07 (×3): 30 mg via ORAL
  Filled 2020-05-06 (×3): qty 1

## 2020-05-06 NOTE — Progress Notes (Signed)
*  PRELIMINARY RESULTS* Echocardiogram 2D Echocardiogram has been performed.  Tracy Singh 05/06/2020, 9:19 AM

## 2020-05-06 NOTE — Consult Note (Signed)
Referring Physician:  No referring provider defined for this encounter.  Primary Physician:  Tracy Milan, MD  Chief Complaint:  back pain  History of Present Illness: 05/06/2020 Tracy Singh is a 87 y.o. female who presents with the chief complaint of back pain as well as atrial fibrillation with RVR.  She had initial back pain in March around the 11th, and saw Dr. Martha Singh. He started muscle relaxants with some improvement.  She started back at her prior activities, but then had a major setback on April 11th.  Due to incapacitating pain, she was brought to ER for evaluation.  In ER, she had Afib with RVR, and has been admitted to medicine for management.  Her back showed evidence of a compression fracture.  She has no new radicular symptoms.   Review of Systems:  A 10 point review of systems is negative, except for the pertinent positives and negatives detailed in the HPI.  Past Medical History: Past Medical History:  Diagnosis Date  . A-fib (HCC)   . Sepsis (HCC) 03/21/2018  . Von Willebrand disease (HCC)     Past Surgical History: Past Surgical History:  Procedure Laterality Date  . ABDOMINAL HYSTERECTOMY      Allergies: Allergies as of 05/05/2020 - Review Complete 05/05/2020  Allergen Reaction Noted  . Red dye Swelling 05/15/2018  . Yellow dyes (non-tartrazine) Swelling 05/15/2018    Medications:  Current Facility-Administered Medications:  .  acetaminophen (TYLENOL) tablet 650 mg, 650 mg, Oral, Q6H PRN **OR** acetaminophen (TYLENOL) suppository 650 mg, 650 mg, Rectal, Q6H PRN, Singh, Tracy A, MD .  dabigatran (PRADAXA) capsule 150 mg, 150 mg, Oral, Q12H, Singh, Tracy A, MD, 150 mg at 05/06/20 0953 .  [COMPLETED] diltiazem (CARDIZEM) 1 mg/mL load via infusion 10 mg, 10 mg, Intravenous, Once, 10 mg at 05/05/20 0129 **AND** diltiazem (CARDIZEM) 125 mg in dextrose 5% 125 mL (1 mg/mL) infusion, 5-15 mg/hr, Intravenous, Continuous, Gilles Chiquito, MD,  Last Rate: 5 mL/hr at 05/05/20 1716, 5 mg/hr at 05/05/20 1716 .  furosemide (LASIX) injection 20 mg, 20 mg, Intravenous, Q12H, Singh, Anna, PA-C .  lidocaine (LIDODERM) 5 % 1 patch, 1 patch, Transdermal, Q24H, Gilles Chiquito, MD, 1 patch at 05/06/20 0040 .  loperamide (IMODIUM) capsule 2 mg, 2 mg, Oral, PRN, Singh, Tracy A, MD .  magnesium hydroxide (MILK OF MAGNESIA) suspension 30 mL, 30 mL, Oral, Daily PRN, Singh, Tracy A, MD .  metoprolol succinate (TOPROL-XL) 24 hr tablet 50 mg, 50 mg, Oral, Daily, Gilles Chiquito, MD, 50 mg at 05/06/20 1324 .  morphine 2 MG/ML injection 2 mg, 2 mg, Intravenous, Q4H PRN, Lynn Ito, MD .  multivitamin with minerals tablet 1 tablet, 1 tablet, Oral, Daily, Singh, Tracy Honey, MD, 1 tablet at 05/06/20 0952 .  ondansetron (ZOFRAN) tablet 4 mg, 4 mg, Oral, Q6H PRN **OR** ondansetron (ZOFRAN) injection 4 mg, 4 mg, Intravenous, Q6H PRN, Singh, Tracy A, MD, 4 mg at 05/05/20 1949 .  traZODone (DESYREL) tablet 25 mg, 25 mg, Oral, QHS PRN, Singh, Tracy A, MD, 25 mg at 05/05/20 2200   Social History: Social History   Tobacco Use  . Smoking status: Former Smoker    Packs/day: 0.05    Years: 6.00    Pack years: 0.30    Types: Cigarettes    Quit date: 52    Years since quitting: 65.3  . Smokeless tobacco: Never Used  . Tobacco comment: 1 pack every 2 weeks  Vaping Use  .  Vaping Use: Never used  Substance Use Topics  . Alcohol use: Never  . Drug use: Never    Family Medical History: Family History  Problem Relation Age of Onset  . Bladder Cancer Father   . Cancer Brother     Physical Examination: Vitals:   05/06/20 1500 05/06/20 1520  BP: 114/85   Pulse: 82 (!) 110  Resp: 20 19  Temp:    SpO2: 92%      General: Patient is well developed, well nourished, calm, collected, and in no apparent distress.  Psychiatric: Patient is non-anxious.  Head:  Pupils equal, round, and reactive to light.  ENT:  Oral mucosa appears well hydrated. Hard of  hearing.  Neck:   Supple.  Full range of motion.  Respiratory: Patient is breathing without any difficulty.  Extremities: No edema.  Vascular: Palpable pulses in dorsal pedal vessels.  Skin:   On exposed skin, there are no abnormal skin lesions.  NEUROLOGICAL:  General: In no acute distress.   Awake, alert, oriented to person, place, and time.  Pupils equal round and reactive to light.  Facial tone is symmetric.  Tongue protrusion is midline.  There is no pronator drift.  ROM of spine: untested.  Strength: Side Biceps Triceps Deltoid Interossei Grip Wrist Ext. Wrist Flex.  R 5 5 5 5 5 5 5   L 5 5 5 5 5 5 5    Side Iliopsoas Quads Hamstring PF DF EHL  R 5 5 5 5 5 5   L 5 5 5 5 5 5     Bilateral upper and lower extremity sensation is intact to light touch. Reflexes are 2+ and symmetric at the biceps, triceps, brachioradialis, patella and achilles. Hoffman's is absent.  Clonus is not present.   Gait is untested.  Imaging: L spine xrays 05/05/2020 IMPRESSION: Age-indeterminate L2 superior endplate fracture with approximately 30-40% loss of height.  Moderate degenerative disc disease L4-5 with cystic grade 1 anterolisthesis.  Cholelithiasis   Electronically Signed   By: MD   On: 05/05/2020 02:31  I have personally reviewed the images and agree with the above interpretation.  Labs: CBC Latest Ref Rng & Units 05/05/2020 05/05/2020 04/01/2018  WBC 4.0 - 10.5 Singh/uL 9.0 7.0 8.7  Hemoglobin 12.0 - 15.0 g/dL 07/05/2020 07/05/2020 07/05/2020  Hematocrit 36.0 - 46.0 % 40.3 41.1 40.0  Platelets 150 - 400 Singh/uL [phone](H)       Assessment and Plan: [name] is a pleasant 87 y.o. female with L2 compression fracture of unknown duration.  - LSO - L spine MRI - consider kyphoplasty if she cannot ambulate with LSO    Tracy Singh. 09.6 MD, MPHS Dept. of Neurosurgery

## 2020-05-06 NOTE — Progress Notes (Signed)
OT Cancellation Note  Patient Details Name: Tracy Singh MRN: [id number] DOB: August 17, 1924   Cancelled Treatment:    Reason Eval/Treat Not Completed: Other (comment). Per chart review pt with acute L2 fx and ortho consult pending. Will hold at this time until cleared for initiation of evaluation. Plan to follow up at next time/date as able.   Kathie Dike, M.S. OTR/L  05/06/20, 1:06 PM  ascom [phone]

## 2020-05-06 NOTE — Progress Notes (Signed)
[facility] Cardiology    SUBJECTIVE: The patient reports feeling better. She denies chest pain, shortness of breath, palpitations, or any pain at this time, but does experience significant pain of back and leg with any ambulation or movement in bed.   Vitals:   05/06/20 0900 05/06/20 0930 05/06/20 1000 05/06/20 1130  BP: (!) 118/102  (!) 116/58 118/82  Pulse: 81 (!) 108 90 99  Resp: (!) 22 (!) 21 19 16   Temp:    97.8 F (36.6 C)  TempSrc:    Oral  SpO2: 94% 93% 96% 93%  Weight:      Height:         Intake/Output Summary (Last 24 hours) at 05/06/2020 1221 Last data filed at 05/06/2020 1138 Gross per 24 hour  Intake 391.56 ml  Output 3550 ml  Net -3158.44 ml      PHYSICAL EXAM  General: Well developed, well nourished, elderly female sitting up in bed, appears comfortable and happy, in no acute distress HEENT:  Normocephalic and atramatic Neck:  No JVD.  Lungs: normal effort of breathing on room air, no wheezing Heart: irregularly irregular. without gallops or murmurs.  Abdomen: Bowel sounds are positive, abdomen soft and non-tender . Extremities: No clubbing, cyanosis or edema.   Neuro: Alert and oriented X 3. Psych:  Good affect, responds appropriately   LABS: Basic Metabolic Panel: Recent Labs    05/05/20 0045 05/05/20 0517 05/06/20 0556  NA  --  133* 132*  K  --  3.9 3.8  CL  --  99 91*  CO2  --  25 30  GLUCOSE  --  151* 110*  BUN  --  13 14  CREATININE  --  0.60 0.85  CALCIUM  --  8.8* 9.3  MG 2.0  --   --    Liver Function Tests: Recent Labs    05/05/20 0043  AST 26  ALT 15  ALKPHOS 83  BILITOT 0.8  PROT 7.5  ALBUMIN 3.9   No results for input(s): LIPASE, AMYLASE in the last 72 hours. CBC: Recent Labs    05/05/20 0043 05/05/20 0517  WBC 7.0 9.0  NEUTROABS 4.5  --   HGB 14.1 13.7  HCT 41.1 40.3  MCV 91.5 92.6  PLT 279 266   Cardiac Enzymes: No results for input(s): CKTOTAL, CKMB, CKMBINDEX, TROPONINI in the last 72 hours. BNP: Invalid  input(s): POCBNP D-Dimer: Recent Labs    05/05/20 0043  DDIMER 0.45   Hemoglobin A1C: No results for input(s): HGBA1C in the last 72 hours. Fasting Lipid Panel: No results for input(s): CHOL, HDL, LDLCALC, TRIG, CHOLHDL, LDLDIRECT in the last 72 hours. Thyroid Function Tests: No results for input(s): TSH, T4TOTAL, T3FREE, THYROIDAB in the last 72 hours.  Invalid input(s): FREET3 Anemia Panel: No results for input(s): VITAMINB12, FOLATE, FERRITIN, TIBC, IRON, RETICCTPCT in the last 72 hours.  DG Thoracic Spine 2 View  Result Date: 05/05/2020 CLINICAL DATA:  Back pain EXAM: THORACIC SPINE 2 VIEWS COMPARISON:  None. FINDINGS: The osseous structures are diffusely osteopenic. Normal thoracic kyphosis. No listhesis. No acute fracture of the thoracic spine. Vertebral body height appears preserved. The paraspinal soft tissues are unremarkable. L2 superior endplate fracture again noted. IMPRESSION: No definite acute fracture or listhesis of the thoracic spine. Electronically Signed   By: 07/05/2020 MD   On: 05/05/2020 02:34   DG Lumbar Spine Complete  Result Date: 05/05/2020 CLINICAL DATA:  Back pain EXAM: LUMBAR SPINE - COMPLETE 4+ VIEW COMPARISON:  None. FINDINGS: Five view radiograph lumbar spine demonstrates normal lumbar lordosis. There is grade 1 anterolisthesis of L4 upon L5. Superior endplate fracture of L2 is noted, age indeterminate, with approximately 30-40% loss of height. This appears new since prior chest radiograph of 04/01/2018. Remaining vertebral body height has been preserved. There is intervertebral disc space narrowing at L4-5 in keeping with changes of moderate degenerative disc disease at this level. Remaining intervertebral disc heights are preserved. The paraspinal soft tissues are unremarkable. Vascular calcifications noted within the abdominal aorta. Cholelithiasis is incidentally noted. IMPRESSION: Age-indeterminate L2 superior endplate fracture with approximately  30-40% loss of height. Moderate degenerative disc disease L4-5 with cystic grade 1 anterolisthesis. Cholelithiasis Electronically Signed   By: Helyn Numbers MD   On: 05/05/2020 02:31   CT Angio Chest PE W and/or Wo Contrast  Result Date: 05/05/2020 CLINICAL DATA:  87 year old female with low back pain and hypoxia. EXAM: CT ANGIOGRAPHY CHEST WITH CONTRAST TECHNIQUE: Multidetector CT imaging of the chest was performed using the standard protocol during bolus administration of intravenous contrast. Multiplanar CT image reconstructions and MIPs were obtained to evaluate the vascular anatomy. CONTRAST:  Seventy-five mL Omnipaque 350, intravenous COMPARISON:  01/25/2010 FINDINGS: Cardiovascular: Satisfactory opacification of the pulmonary arteries to the segmental level. No evidence of pulmonary embolism. Severe biatrial cardiomegaly, advanced from 2012 comparison. No pericardial effusion. Aortic atherosclerotic calcifications. Coronary atherosclerotic calcifications. Mediastinum/Nodes: No enlarged mediastinal, hilar, or axillary lymph nodes. Thyroid gland, trachea, and esophagus demonstrate no significant findings. Lungs/Pleura: Bibasilar subsegmental atelectasis. Trace bilateral pleural effusions versus dependent pleural thickening. Mild centrilobular emphysema. Similar appearing apical scarring with calcification. No suspicious pulmonary nodules. No pneumothorax. Upper Abdomen: No acute abnormality. Similar appearing multifocal simple hepatic cysts. Similar appearing simple cyst arising from the medial cortex of the left upper pole kidney. Small hiatal hernia. Musculoskeletal: No acute fracture. No aggressive appearing osseous lesion. Diffuse osteopenia. Review of the MIP images confirms the above findings. IMPRESSION: Vascular: 1. No evidence of pulmonary embolism. 2. Severe biatrial cardiomegaly. 3. Coronary and aortic atherosclerosis (ICD10-I70.0). Non-Vascular: Bibasilar subsegmental atelectasis. Marliss Coots,  MD [facility] Electronically Signed   By: Marliss Coots MD   On: 05/05/2020 07:57   US Venous Img Lower Unilateral Right (DVT)  Result Date: 05/05/2020 CLINICAL DATA:  Right lower extremity pain and edema. EXAM: RIGHT LOWER EXTREMITY VENOUS DOPPLER ULTRASOUND TECHNIQUE: Gray-scale sonography with graded compression, as well as color Doppler and duplex ultrasound were performed to evaluate the lower extremity deep venous systems from the level of the common femoral vein and including the common femoral, femoral, profunda femoral, popliteal and calf veins including the posterior tibial, peroneal and gastrocnemius veins when visible. The superficial great saphenous vein was also interrogated. Spectral Doppler was utilized to evaluate flow at rest and with distal augmentation maneuvers in the common femoral, femoral and popliteal veins. COMPARISON:  None. FINDINGS: Contralateral Common Femoral Vein: Respiratory phasicity is normal and symmetric with the symptomatic side. No evidence of thrombus. Normal compressibility. Common Femoral Vein: No evidence of thrombus. Normal compressibility, respiratory phasicity and response to augmentation. Saphenofemoral Junction: No evidence of thrombus. Normal compressibility and flow on color Doppler imaging. Profunda Femoral Vein: No evidence of thrombus. Normal compressibility and flow on color Doppler imaging. Femoral Vein: No evidence of thrombus. Normal compressibility, respiratory phasicity and response to augmentation. Popliteal Vein: No evidence of thrombus. Normal compressibility, respiratory phasicity and response to augmentation. Calf Veins: No evidence of thrombus. Normal compressibility and flow on color  Doppler imaging. Superficial Great Saphenous Vein: No evidence of thrombus. Normal compressibility. Venous Reflux:  None. Other Findings: No evidence of superficial thrombophlebitis or abnormal fluid  collection. IMPRESSION: No evidence of right lower extremity deep venous thrombosis. Electronically Signed   By: Irish Lack M.D.   On: 05/05/2020 12:43   DG Chest Portable 1 View  Result Date: 05/05/2020 CLINICAL DATA:  Tachycardia EXAM: PORTABLE CHEST 1 VIEW COMPARISON:  04/01/2018 FINDINGS: Background interstitial prominence is again identified likely reflecting changes of underlying chronic interstitial lung disease. No superimposed confluent pulmonary infiltrates. No pneumothorax or pleural effusion. Biapical pleural scarring again noted, unchanged. Mild cardiomegaly is stable. The pulmonary vascularity is normal. No acute bone abnormality. IMPRESSION: Stable cardiomegaly. No radiographic evidence of acute cardiopulmonary disease. Electronically Signed   By: Helyn Numbers MD   On: 05/05/2020 02:28   ECHOCARDIOGRAM COMPLETE  Result Date: 05/06/2020    ECHOCARDIOGRAM REPORT   Patient Name:   Tracy Singh Date of Exam: 05/06/2020 Medical Rec #:  [id number]            Height:       64.0 in Accession #:    [id number]           Weight:       149.0 lb Date of Birth:  1924-08-20            BSA:          1.726 m Patient Age:    87 years             BP:           122/48 mmHg Patient Gender: F                    HR:           104 bpm. Exam Location:  [facility] Procedure: 2D Echo, Color Doppler and Cardiac Doppler Indications:     I48.91 Atrial fibrillation  History:         Patient has no prior history of Echocardiogram examinations.                  Sepsis.  Sonographer:     Humphrey Rolls RDCS (AE) Referring Phys:  [id number] Vernetta Honey MANSY Diagnosing Phys: Marcina Millard MD  Sonographer Comments: Suboptimal apical window and suboptimal subcostal window. IMPRESSIONS  1. Left ventricular ejection fraction, by estimation, is 60 to 65%. The left ventricle has normal function. The left ventricle has no regional wall motion abnormalities. Left ventricular diastolic parameters are indeterminate.  2. Right ventricular  systolic function is normal. The right ventricular size is normal.  3. The mitral valve is normal in structure. Mild to moderate mitral valve regurgitation. No evidence of mitral stenosis.  4. The aortic valve is normal in structure. Aortic valve regurgitation is not visualized. No aortic stenosis is present.  5. The inferior vena cava is normal in size with greater than 50% respiratory variability, suggesting right atrial pressure of 3 mmHg. FINDINGS  Left Ventricle: Left ventricular ejection fraction, by estimation, is 60 to 65%. The left ventricle has normal function. The left ventricle has no regional wall motion abnormalities. The left ventricular internal cavity size was normal in size. There is  no left ventricular hypertrophy. Left ventricular diastolic parameters are indeterminate. Right Ventricle: The right ventricular size is normal. No increase in right ventricular wall thickness. Right ventricular systolic function is normal. Left Atrium: Left atrial size was normal in size. Right Atrium: Right  atrial size was normal in size. Pericardium: Trivial pericardial effusion is present. Mitral Valve: The mitral valve is normal in structure. Mild to moderate mitral valve regurgitation. No evidence of mitral valve stenosis. MV peak gradient, 9.6 mmHg. The mean mitral valve gradient is 4.0 mmHg. Tricuspid Valve: The tricuspid valve is normal in structure. Tricuspid valve regurgitation is mild . No evidence of tricuspid stenosis. Aortic Valve: The aortic valve is normal in structure. Aortic valve regurgitation is not visualized. No aortic stenosis is present. Aortic valve mean gradient measures 3.0 mmHg. Aortic valve peak gradient measures 6.2 mmHg. Aortic valve area, by VTI measures 2.06 cm. Pulmonic Valve: The pulmonic valve was normal in structure. Pulmonic valve regurgitation is not visualized. No evidence of pulmonic stenosis. Aorta: The aortic root is normal in size and structure. Venous: The inferior vena  cava is normal in size with greater than 50% respiratory variability, suggesting right atrial pressure of 3 mmHg. IAS/Shunts: No atrial level shunt detected by color flow Doppler.  LEFT VENTRICLE PLAX 2D LVIDd:         4.50 cm  Diastology LVIDs:         2.90 cm  LV e' medial:    8.05 cm/s LV PW:         1.10 cm  LV E/e' medial:  16.5 LV IVS:        0.80 cm  LV e' lateral:   11.30 cm/s LVOT diam:     1.80 cm  LV E/e' lateral: 11.7 LV SV:         45 LV SV Index:   26 LVOT Area:     2.54 cm  RIGHT VENTRICLE RV Basal diam:  3.40 cm LEFT ATRIUM           Index LA diam:      4.20 cm 2.43 cm/m LA Vol (A2C): 38.2 ml 22.13 ml/m LA Vol (A4C): 91.8 ml 53.18 ml/m  AORTIC VALVE                   PULMONIC VALVE AV Area (Vmax):    2.04 cm    PV Vmax:       0.83 m/s AV Area (Vmean):   2.13 cm    PV Vmean:      55.900 cm/s AV Area (VTI):     2.06 cm    PV VTI:        0.152 m AV Vmax:           125.00 cm/s PV Peak grad:  2.8 mmHg AV Vmean:          87.400 cm/s PV Mean grad:  1.0 mmHg AV VTI:            0.217 m AV Peak Grad:      6.2 mmHg AV Mean Grad:      3.0 mmHg LVOT Vmax:         100.00 cm/s LVOT Vmean:        73.300 cm/s LVOT VTI:          0.176 m LVOT/AV VTI ratio: 0.81  AORTA Ao Root diam: 2.70 cm MITRAL VALVE                TRICUSPID VALVE MV Area (PHT): 3.97 cm     TR Peak grad:   26.4 mmHg MV Area VTI:   1.77 cm     TR Vmax:        257.00 cm/s MV Peak grad:  9.6  mmHg MV Mean grad:  4.0 mmHg     SHUNTS MV Vmax:       1.55 m/s     Systemic VTI:  0.18 m MV Vmean:      85.4 cm/s    Systemic Diam: 1.80 cm MV Decel Time: 191 msec MV E velocity: 132.50 cm/s Marcina MillardAlexander Paraschos MD Electronically signed by Marcina MillardAlexander Paraschos MD Signature Date/Time: 05/06/2020/12:09:45 PM    Final      Echo LVEF 60-65%, no RWMA, mild to moderate MR  TELEMETRY: atrial fibrillation, rate low 100s bpm  ASSESSMENT AND PLAN:  Active Problems:   Atrial fibrillation with rapid ventricular response (HCC)    1. Atrial fibrillation with  RVR, with a history of chronic atrial fibrillation on Pradaxa and metoprolol with no missed doses; rate is likely compensatory in setting of acute low back pain. She is currently on Cardizem drip with rates in the low 100s. 2. L2 compression fracture 3. Acute on chronic CHF, previous echo in 2018 showed; currently on IV Lasix, overall improving 4. Hypertensive urgency, improved  Recommendations: 1. Continue Pradaxa for stroke prevention 2. Continue metoprolol succinate 50 mg daily (patient has not had it yet today as she wanted to wait to take it at lunch) 3. Will likely discontinue Cardizem drip this afternoon if heart rate improves after receiving metoprolol  4. Reduce IV Lasix to 20 mg BID 5. Recommend consulting neurosurgery    Leanora Ivanoffnna Emilyann Banka, Cordelia Poche-C 05/06/2020 12:21 PM

## 2020-05-06 NOTE — Progress Notes (Signed)
Oxygen noted to drop while patient was sleeping. 86% while on room air. Placed on 2L [place] and oxygen approved to 96%. Patient and family updated on oxygen.  Verbalizes an understanding.  Call bell remains within reach.  Will continue to monitor.

## 2020-05-06 NOTE — Progress Notes (Signed)
PT Cancellation Note  Patient Details Name: Tracy Singh MRN: [id number] DOB: 04-24-1924   Cancelled Treatment:    Reason Eval/Treat Not Completed: Patient not medically ready. Patient has L2 fx and ortho consult pending. Will hold until cleared by ortho.    Maripaz Mullan 05/06/2020, 1:03 PM

## 2020-05-06 NOTE — Progress Notes (Signed)
PROGRESS NOTE    Tracy Singh  OXB:[id number] DOB: [birth year] DOA: 05/05/2020 PCP: Reubin Milan, MD    Brief Narrative:  Tracy Singh is a 87 y.o. Caucasian female with medical history significant for atrial fibrillation on Pradaxa and von Willebrand disease, who presented to the emergency room with acute onset of low back pain for which she was recently seen by Dr. Martha Clan and prescribed Robaxin with mild help.  She has been having urinary frequency and urgency and felt to have a UTI.  She was seen by her cardiologist and given p.o. Cipro. She does have bilateral lower extremity edema.  Her pulse extremities dropped to 85% on room air in the ER and she was placed on 2 L of O2 by nasal cannula.ED Course: Upon presentation to the emergency room blood pressure was 186/152 with a heart rate of 137 and she was in rapid atrial fibrillation.  Labs revealed mild hyponatremia.BNP was 724.5 and high-sensitivity troponin I was 11 and later 9.  Influenza antigens and COVID-19 PCR came back negative   EKG was atrial fibrillation with RVR.  Lumbar spine x-ray showed L2 superior endplate fracture with approximately 30 to 40% loss of height and moderate degenerative disc disease at L4-L5 with cystic grade 1 anterolisthesis as well as cholelithiasis.  T-spine x-ray showed no acute abnormalities.       Consultants:   Cardiology, neurosurgery  Procedures:   Antimicrobials:       Subjective: Patient feeling better today as far as pain goes.  Is controlled.  Feels lidocaine patch is helping  Objective: Vitals:   05/06/20 1500 05/06/20 1520 05/06/20 1600 05/06/20 1700  BP: 114/85  112/86 113/69  Pulse: 82 (!) 110 80 85  Resp: 20 19 18 18   Temp:      TempSrc:      SpO2: 92%  94% 93%  Weight:      Height:        Intake/Output Summary (Last 24 hours) at 05/06/2020 1741 Last data filed at 05/06/2020 1138 Gross per 24 hour  Intake 391.56 ml  Output 2700 ml  Net -2308.44 ml    Filed Weights   05/05/20 0039  Weight: 67.6 kg    Examination:  General exam: Appears calm and comfortable  Respiratory system: Decreased breath sounds at bases Cardiovascular system: Irregular S1 & S2 heard, RRR.  No gallops  gastrointestinal system: Abdomen is nondistended, soft and nontender. Normal bowel sounds heard. Central nervous system: Alert and oriented.  Grossly intact Extremities: Trace pedal edema Skin: Warm dry Psychiatry: Judgement and insight appear normal. Mood & affect appropriate.     Data Reviewed: I have personally reviewed following labs and imaging studies  CBC: Recent Labs  Lab 05/05/20 0043 05/05/20 0517  WBC 7.0 9.0  NEUTROABS 4.5  --   HGB 14.1 13.7  HCT 41.1 40.3  MCV 91.5 92.6  PLT 279 266   Basic Metabolic Panel: Recent Labs  Lab 05/05/20 0043 05/05/20 0045 05/05/20 0517 05/06/20 0556  NA 133*  --  133* 132*  K 4.1  --  3.9 3.8  CL 100  --  99 91*  CO2 23  --  25 30  GLUCOSE 148*  --  151* 110*  BUN 16  --  13 14  CREATININE 0.69  --  0.60 0.85  CALCIUM 9.2  --  8.8* 9.3  MG  --  2.0  --   --    GFR: Estimated Creatinine Clearance: 39.1 mL/min (  by C-G formula based on SCr of 0.85 mg/dL). Liver Function Tests: Recent Labs  Lab 05/05/20 0043  AST 26  ALT 15  ALKPHOS 83  BILITOT 0.8  PROT 7.5  ALBUMIN 3.9   No results for input(s): LIPASE, AMYLASE in the last 168 hours. No results for input(s): AMMONIA in the last 168 hours. Coagulation Profile: Recent Labs  Lab 05/05/20 0043  INR 1.8*   Cardiac Enzymes: No results for input(s): CKTOTAL, CKMB, CKMBINDEX, TROPONINI in the last 168 hours. BNP (last 3 results) No results for input(s): PROBNP in the last 8760 hours. HbA1C: No results for input(s): HGBA1C in the last 72 hours. CBG: No results for input(s): GLUCAP in the last 168 hours. Lipid Profile: No results for input(s): CHOL, HDL, LDLCALC, TRIG, CHOLHDL, LDLDIRECT in the last 72 hours. Thyroid Function  Tests: No results for input(s): TSH, T4TOTAL, FREET4, T3FREE, THYROIDAB in the last 72 hours. Anemia Panel: No results for input(s): VITAMINB12, FOLATE, FERRITIN, TIBC, IRON, RETICCTPCT in the last 72 hours. Sepsis Labs: No results for input(s): PROCALCITON, LATICACIDVEN in the last 168 hours.  Recent Results (from the past 240 hour(s))  Resp Panel by RT-PCR (Flu A&B, Covid) Nasopharyngeal Swab     Status: None   Collection Time: 05/05/20  1:26 AM   Specimen: Nasopharyngeal Swab; Nasopharyngeal(NP) swabs in vial transport medium  Result Value Ref Range Status   SARS Coronavirus 2 by RT PCR NEGATIVE NEGATIVE Final    Comment: (NOTE) SARS-CoV-2 target nucleic acids are NOT DETECTED.  The SARS-CoV-2 RNA is generally detectable in upper respiratory specimens during the acute phase of infection. The lowest concentration of SARS-CoV-2 viral copies this assay can detect is 138 copies/mL. A negative result does not preclude SARS-Cov-2 infection and should not be used as the sole basis for treatment or other patient management decisions. A negative result may occur with  improper specimen collection/handling, submission of specimen other than nasopharyngeal swab, presence of viral mutation(s) within the areas targeted by this assay, and inadequate number of viral copies(<138 copies/mL). A negative result must be combined with clinical observations, patient history, and epidemiological information. The expected result is Negative.  Fact Sheet for Patients:  BloggerCourse.com  Fact Sheet for Healthcare Providers:  SeriousBroker.it  This test is no t yet approved or cleared by the [place] FDA and  has been authorized for detection and/or diagnosis of SARS-CoV-2 by FDA under an Emergency Use Authorization (EUA). This EUA will remain  in effect (meaning this test can be used) for the duration of the COVID-19 declaration under Section  564(b)(1) of the Act, 21 U.S.C.section 360bbb-3(b)(1), unless the authorization is terminated  or revoked sooner.       Influenza A by PCR NEGATIVE NEGATIVE Final   Influenza B by PCR NEGATIVE NEGATIVE Final    Comment: (NOTE) The Xpert Xpress SARS-CoV-2/FLU/RSV plus assay is intended as an aid in the diagnosis of influenza from Nasopharyngeal swab specimens and should not be used as a sole basis for treatment. Nasal washings and aspirates are unacceptable for Xpert Xpress SARS-CoV-2/FLU/RSV testing.  Fact Sheet for Patients: BloggerCourse.com  Fact Sheet for Healthcare Providers: SeriousBroker.it  This test is not yet approved or cleared by the [place] FDA and has been authorized for detection and/or diagnosis of SARS-CoV-2 by FDA under an Emergency Use Authorization (EUA). This EUA will remain in effect (meaning this test can be used) for the duration of the COVID-19 declaration under Section 564(b)(1) of the Act, 21 U.S.C. section 360bbb-3(b)(1),  unless the authorization is terminated or revoked.  Performed at [facility], [address] [place], [place] [address]          Radiology Studies: DG Thoracic Spine 2 View  Result Date: 05/05/2020 CLINICAL DATA:  Back pain EXAM: THORACIC SPINE 2 VIEWS COMPARISON:  None. FINDINGS: The osseous structures are diffusely osteopenic. Normal thoracic kyphosis. No listhesis. No acute fracture of the thoracic spine. Vertebral body height appears preserved. The paraspinal soft tissues are unremarkable. L2 superior endplate fracture again noted. IMPRESSION: No definite acute fracture or listhesis of the thoracic spine. Electronically Signed   By: Helyn Numbers MD   On: 05/05/2020 02:34   DG Lumbar Spine Complete  Result Date: 05/05/2020 CLINICAL DATA:  Back pain EXAM: LUMBAR SPINE - COMPLETE 4+ VIEW COMPARISON:  None. FINDINGS: Five view radiograph lumbar spine  demonstrates normal lumbar lordosis. There is grade 1 anterolisthesis of L4 upon L5. Superior endplate fracture of L2 is noted, age indeterminate, with approximately 30-40% loss of height. This appears new since prior chest radiograph of 04/01/2018. Remaining vertebral body height has been preserved. There is intervertebral disc space narrowing at L4-5 in keeping with changes of moderate degenerative disc disease at this level. Remaining intervertebral disc heights are preserved. The paraspinal soft tissues are unremarkable. Vascular calcifications noted within the abdominal aorta. Cholelithiasis is incidentally noted. IMPRESSION: Age-indeterminate L2 superior endplate fracture with approximately 30-40% loss of height. Moderate degenerative disc disease L4-5 with cystic grade 1 anterolisthesis. Cholelithiasis Electronically Signed   By: Helyn Numbers MD   On: 05/05/2020 02:31   CT Angio Chest PE W and/or Wo Contrast  Result Date: 05/05/2020 CLINICAL DATA:  87 year old female with low back pain and hypoxia. EXAM: CT ANGIOGRAPHY CHEST WITH CONTRAST TECHNIQUE: Multidetector CT imaging of the chest was performed using the standard protocol during bolus administration of intravenous contrast. Multiplanar CT image reconstructions and MIPs were obtained to evaluate the vascular anatomy. CONTRAST:  Seventy-five mL Omnipaque 350, intravenous COMPARISON:  01/25/2010 FINDINGS: Cardiovascular: Satisfactory opacification of the pulmonary arteries to the segmental level. No evidence of pulmonary embolism. Severe biatrial cardiomegaly, advanced from 2012 comparison. No pericardial effusion. Aortic atherosclerotic calcifications. Coronary atherosclerotic calcifications. Mediastinum/Nodes: No enlarged mediastinal, hilar, or axillary lymph nodes. Thyroid gland, trachea, and esophagus demonstrate no significant findings. Lungs/Pleura: Bibasilar subsegmental atelectasis. Trace bilateral pleural effusions versus dependent pleural  thickening. Mild centrilobular emphysema. Similar appearing apical scarring with calcification. No suspicious pulmonary nodules. No pneumothorax. Upper Abdomen: No acute abnormality. Similar appearing multifocal simple hepatic cysts. Similar appearing simple cyst arising from the medial cortex of the left upper pole kidney. Small hiatal hernia. Musculoskeletal: No acute fracture. No aggressive appearing osseous lesion. Diffuse osteopenia. Review of the MIP images confirms the above findings. IMPRESSION: Vascular: 1. No evidence of pulmonary embolism. 2. Severe biatrial cardiomegaly. 3. Coronary and aortic atherosclerosis (ICD10-I70.0). Non-Vascular: Bibasilar subsegmental atelectasis. Marliss Coots, MD [facility] Radiology Electronically Signed   By: Marliss Coots MD   On: 05/05/2020 07:57   US Venous Img Lower Unilateral Right (DVT)  Result Date: 05/05/2020 CLINICAL DATA:  Right lower extremity pain and edema. EXAM: RIGHT LOWER EXTREMITY VENOUS DOPPLER ULTRASOUND TECHNIQUE: Gray-scale sonography with graded compression, as well as color Doppler and duplex ultrasound were performed to evaluate the lower extremity deep venous systems from the level of the common femoral vein and including the common femoral, femoral, profunda femoral, popliteal and calf veins including the posterior tibial, peroneal and gastrocnemius veins when visible.  The superficial great saphenous vein was also interrogated. Spectral Doppler was utilized to evaluate flow at rest and with distal augmentation maneuvers in the common femoral, femoral and popliteal veins. COMPARISON:  None. FINDINGS: Contralateral Common Femoral Vein: Respiratory phasicity is normal and symmetric with the symptomatic side. No evidence of thrombus. Normal compressibility. Common Femoral Vein: No evidence of thrombus. Normal compressibility, respiratory phasicity and response to augmentation. Saphenofemoral Junction:  No evidence of thrombus. Normal compressibility and flow on color Doppler imaging. Profunda Femoral Vein: No evidence of thrombus. Normal compressibility and flow on color Doppler imaging. Femoral Vein: No evidence of thrombus. Normal compressibility, respiratory phasicity and response to augmentation. Popliteal Vein: No evidence of thrombus. Normal compressibility, respiratory phasicity and response to augmentation. Calf Veins: No evidence of thrombus. Normal compressibility and flow on color Doppler imaging. Superficial Great Saphenous Vein: No evidence of thrombus. Normal compressibility. Venous Reflux:  None. Other Findings: No evidence of superficial thrombophlebitis or abnormal fluid collection. IMPRESSION: No evidence of right lower extremity deep venous thrombosis. Electronically Signed   By: Irish LackGlenn  Yamagata M.D.   On: 05/05/2020 12:43   DG Chest Portable 1 View  Result Date: 05/05/2020 CLINICAL DATA:  Tachycardia EXAM: PORTABLE CHEST 1 VIEW COMPARISON:  04/01/2018 FINDINGS: Background interstitial prominence is again identified likely reflecting changes of underlying chronic interstitial lung disease. No superimposed confluent pulmonary infiltrates. No pneumothorax or pleural effusion. Biapical pleural scarring again noted, unchanged. Mild cardiomegaly is stable. The pulmonary vascularity is normal. No acute bone abnormality. IMPRESSION: Stable cardiomegaly. No radiographic evidence of acute cardiopulmonary disease. Electronically Signed   By: Helyn NumbersAshesh  Parikh MD   On: 05/05/2020 02:28   ECHOCARDIOGRAM COMPLETE  Result Date: 05/06/2020    ECHOCARDIOGRAM REPORT   Patient Name:   Marti SleighHERESE Y Nyman Date of Exam: 05/06/2020 Medical Rec #:  [id number]            Height:       64.0 in Accession #:    [id number]           Weight:       149.0 lb Date of Birth:  02/13/1924            BSA:          1.726 m Patient Age:    89 years             BP:           122/48 mmHg Patient Gender: F                    HR:            104 bpm. Exam Location:  [facility] Procedure: 2D Echo, Color Doppler and Cardiac Doppler Indications:     I48.91 Atrial fibrillation  History:         Patient has no prior history of Echocardiogram examinations.                  Sepsis.  Sonographer:     Humphrey RollsJoan Heiss RDCS (AE) Referring Phys:  [id number] Vernetta HoneyJAN A MANSY Diagnosing Phys: Marcina MillardAlexander Paraschos MD  Sonographer Comments: Suboptimal apical window and suboptimal subcostal window. IMPRESSIONS  1. Left ventricular ejection fraction, by estimation, is 60 to 65%. The left ventricle has normal function. The left ventricle has no regional wall motion abnormalities. Left ventricular diastolic parameters are indeterminate.  2. Right ventricular systolic function is normal. The right ventricular size is normal.  3. The mitral valve is normal in structure. Mild  to moderate mitral valve regurgitation. No evidence of mitral stenosis.  4. The aortic valve is normal in structure. Aortic valve regurgitation is not visualized. No aortic stenosis is present.  5. The inferior vena cava is normal in size with greater than 50% respiratory variability, suggesting right atrial pressure of 3 mmHg. FINDINGS  Left Ventricle: Left ventricular ejection fraction, by estimation, is 60 to 65%. The left ventricle has normal function. The left ventricle has no regional wall motion abnormalities. The left ventricular internal cavity size was normal in size. There is  no left ventricular hypertrophy. Left ventricular diastolic parameters are indeterminate. Right Ventricle: The right ventricular size is normal. No increase in right ventricular wall thickness. Right ventricular systolic function is normal. Left Atrium: Left atrial size was normal in size. Right Atrium: Right atrial size was normal in size. Pericardium: Trivial pericardial effusion is present. Mitral Valve: The mitral valve is normal in structure. Mild to moderate mitral valve regurgitation. No evidence of mitral valve stenosis. MV  peak gradient, 9.6 mmHg. The mean mitral valve gradient is 4.0 mmHg. Tricuspid Valve: The tricuspid valve is normal in structure. Tricuspid valve regurgitation is mild . No evidence of tricuspid stenosis. Aortic Valve: The aortic valve is normal in structure. Aortic valve regurgitation is not visualized. No aortic stenosis is present. Aortic valve mean gradient measures 3.0 mmHg. Aortic valve peak gradient measures 6.2 mmHg. Aortic valve area, by VTI measures 2.06 cm. Pulmonic Valve: The pulmonic valve was normal in structure. Pulmonic valve regurgitation is not visualized. No evidence of pulmonic stenosis. Aorta: The aortic root is normal in size and structure. Venous: The inferior vena cava is normal in size with greater than 50% respiratory variability, suggesting right atrial pressure of 3 mmHg. IAS/Shunts: No atrial level shunt detected by color flow Doppler.  LEFT VENTRICLE PLAX 2D LVIDd:         4.50 cm  Diastology LVIDs:         2.90 cm  LV e' medial:    8.05 cm/s LV PW:         1.10 cm  LV E/e' medial:  16.5 LV IVS:        0.80 cm  LV e' lateral:   11.30 cm/s LVOT diam:     1.80 cm  LV E/e' lateral: 11.7 LV SV:         45 LV SV Index:   26 LVOT Area:     2.54 cm  RIGHT VENTRICLE RV Basal diam:  3.40 cm LEFT ATRIUM           Index LA diam:      4.20 cm 2.43 cm/m LA Vol (A2C): 38.2 ml 22.13 ml/m LA Vol (A4C): 91.8 ml 53.18 ml/m  AORTIC VALVE                   PULMONIC VALVE AV Area (Vmax):    2.04 cm    PV Vmax:       0.83 m/s AV Area (Vmean):   2.13 cm    PV Vmean:      55.900 cm/s AV Area (VTI):     2.06 cm    PV VTI:        0.152 m AV Vmax:           125.00 cm/s PV Peak grad:  2.8 mmHg AV Vmean:          87.400 cm/s PV Mean grad:  1.0 mmHg AV VTI:  0.217 m AV Peak Grad:      6.2 mmHg AV Mean Grad:      3.0 mmHg LVOT Vmax:         100.00 cm/s LVOT Vmean:        73.300 cm/s LVOT VTI:          0.176 m LVOT/AV VTI ratio: 0.81  AORTA Ao Root diam: 2.70 cm MITRAL VALVE                TRICUSPID  VALVE MV Area (PHT): 3.97 cm     TR Peak grad:   26.4 mmHg MV Area VTI:   1.77 cm     TR Vmax:        257.00 cm/s MV Peak grad:  9.6 mmHg MV Mean grad:  4.0 mmHg     SHUNTS MV Vmax:       1.55 m/s     Systemic VTI:  0.18 m MV Vmean:      85.4 cm/s    Systemic Diam: 1.80 cm MV Decel Time: 191 msec MV E velocity: 132.50 cm/s Marcina Millard MD Electronically signed by Marcina Millard MD Signature Date/Time: 05/06/2020/12:09:45 PM    Final         Scheduled Meds: . dabigatran  150 mg Oral Q12H  . diltiazem  30 mg Oral Q6H  . furosemide  20 mg Intravenous Q12H  . lidocaine  1 patch Transdermal Q24H  . metoprolol succinate  50 mg Oral Daily  . multivitamin with minerals  1 tablet Oral Daily   Continuous Infusions:  Assessment & Plan:   Active Problems:   Atrial fibrillation with rapid ventricular response (HCC)   1.  Atrial fibrillation with rapid ventricular response. Was started on IV Cardizem.   Continue Pradaxa  Cardiology following  Echo completed pending to be read, will follow up     2.  L2 compression fracture possibly pathological secondary to osteoporosis. Continue with pain management  Neurosurgery consulted, input was appreciated, will obtain L spine MRI Consider kyphoplasty if she cannot ambulate with LSBo LSO PT/OT when cleared by neursurgery  3.  Acute respiratory failure with hypoxia, likely secondary to acute CHF. Keep 02 >92% Echo pending Continue iv lasix   4.  Hypertensive urgency, likely contributing to her acute CHF. improved Continue to monitor   5.  Recent UTI status post treatment with Cipro. -Urinalysis currently negative.  She finished taking Cipro.    DVT prophylaxis: Pradaxa Code Status: Full Family Communication: Daughter at bedside  Status is: Inpatient  Remains inpatient appropriate because:Inpatient level of care appropriate due to severity of illness   Dispo: The patient is from: Home              Anticipated  d/c is to: TBD , likely SNF              Patient currently is not medically stable to d/c.   Difficult to place patient No            LOS: 1 day   Time spent: 35 minutes with more than 50% on COC    Lynn Ito, MD [facility] Pager [phone]  If 7PM-7AM, please contact night-coverage 05/06/2020, 5:41 PM

## 2020-05-07 DIAGNOSIS — M546 Pain in thoracic spine: Secondary | ICD-10-CM

## 2020-05-07 DIAGNOSIS — I5033 Acute on chronic diastolic (congestive) heart failure: Secondary | ICD-10-CM

## 2020-05-07 LAB — BASIC METABOLIC PANEL
Anion gap: 9 (ref 5–15)
BUN: 15 mg/dL (ref 8–23)
CO2: 32 mmol/L (ref 22–32)
Calcium: 8.8 mg/dL — ABNORMAL LOW (ref 8.9–10.3)
Chloride: 91 mmol/L — ABNORMAL LOW (ref 98–111)
Creatinine, Ser: 0.78 mg/dL (ref 0.44–1.00)
GFR, Estimated: >60 mL/min (ref >60)
Glucose, Bld: 113 mg/dL — ABNORMAL HIGH (ref 70–99)
Potassium: 3.5 mmol/L (ref 3.5–5.1)
Sodium: 132 mmol/L — ABNORMAL LOW (ref 135–145)

## 2020-05-07 LAB — BRAIN NATRIURETIC PEPTIDE: B Natriuretic Peptide: 300.2 pg/mL — ABNORMAL HIGH (ref 0.0–100.0)

## 2020-05-07 MED ORDER — METOPROLOL SUCCINATE ER 25 MG PO TB24
25.0000 mg | ORAL_TABLET | Freq: Every day | ORAL | Status: AC
  Administered 2020-05-07: 25 mg via ORAL
  Filled 2020-05-07: qty 1

## 2020-05-07 MED ORDER — DILTIAZEM HCL ER COATED BEADS 120 MG PO CP24: 120.0000 mg | ORAL_CAPSULE | Freq: Every day | ORAL | Status: DC

## 2020-05-07 MED ORDER — FUROSEMIDE 40 MG PO TABS: 40.0000 mg | ORAL_TABLET | Freq: Every day | ORAL | Status: DC

## 2020-05-07 MED ORDER — FUROSEMIDE 20 MG PO TABS
20.0000 mg | ORAL_TABLET | Freq: Every day | ORAL | Status: DC
  Administered 2020-05-08 – 2020-05-15 (×8): 20 mg via ORAL
  Filled 2020-05-07 (×8): qty 1

## 2020-05-07 MED ORDER — DILTIAZEM HCL ER COATED BEADS 120 MG PO CP24
120.0000 mg | ORAL_CAPSULE | Freq: Every day | ORAL | Status: DC
  Administered 2020-05-07 – 2020-05-14 (×8): 120 mg via ORAL
  Filled 2020-05-07 (×8): qty 1

## 2020-05-07 NOTE — Progress Notes (Signed)
Orthopedic Tech Progress Note Patient Details:  Tracy Singh 08-11-1924 [id number] Called in order to [facility] Patient ID: Tracy Singh, female   DOB: May 03, 1924, 87 y.o.   MRN: [id number]   [name] 05/07/2020, 2:36 PM

## 2020-05-07 NOTE — Progress Notes (Signed)
[facility]    SUBJECTIVE: The patient is resting; history obtained from daughter at the bedside. The patient has not been complaining of chest pain, shortness of breath, palpitations, and has had significant improvement in peripheral edema.    Vitals:   05/06/20 2317 05/07/20 0508 05/07/20 0517 05/07/20 0748  BP: (!) 126/50 (!) 146/93  (!) 167/77  Pulse:  69  87  Resp:  18  16  Temp:    98.4 F (36.9 C)  TempSrc:    Oral  SpO2:  98%  100%  Weight:   61.7 kg   Height:         Intake/Output Summary (Last 24 hours) at 05/07/2020 [id number] Last data filed at 05/07/2020 0753 Gross per 24 hour  Intake 289.77 ml  Output 3400 ml  Net -3110.23 ml      PHYSICAL EXAM  General: Well developed, well nourished, elderly female, sleeping in bed, appears comfortable, in no acute distress HEENT:  Normocephalic and atramatic Neck:   No JVD.  Lungs: normal effort of breathing on room air, no wheezing Heart: irregularly irregular. without gallops or murmurs.  . Extremities: No clubbing, cyanosis or edema.   Neuro: sleeping, awakens to voice, waves when spoken to Psych:  Good affect, responds appropriately   LABS: Basic Metabolic Panel: Recent Labs    05/05/20 0045 05/05/20 0517 05/06/20 0556 05/07/20 0613  NA  --    < > 132* 132*  K  --    < > 3.8 3.5  CL  --    < > 91* 91*  CO2  --    < > 30 32  GLUCOSE  --    < > 110* 113*  BUN  --    < > 14 15  CREATININE  --    < > 0.85 0.78  CALCIUM  --    < > 9.3 8.8*  MG 2.0  --   --   --    < > = values in this interval not displayed.   Liver Function Tests: Recent Labs    05/05/20 0043  AST 26  ALT 15  ALKPHOS 83  BILITOT 0.8  PROT 7.5  ALBUMIN 3.9   No results for input(s): LIPASE, AMYLASE in the last 72 hours. CBC: Recent Labs    05/05/20 0043 05/05/20 0517  WBC 7.0 9.0  NEUTROABS 4.5  --   HGB 14.1 13.7  HCT 41.1 40.3  MCV 91.5 92.6  PLT 279 266   Cardiac Enzymes: No results for input(s): CKTOTAL, CKMB,  CKMBINDEX, TROPONINI in the last 72 hours. BNP: Invalid input(s): POCBNP D-Dimer: Recent Labs    05/05/20 0043  DDIMER 0.45   Hemoglobin A1C: No results for input(s): HGBA1C in the last 72 hours. Fasting Lipid Panel: No results for input(s): CHOL, HDL, LDLCALC, TRIG, CHOLHDL, LDLDIRECT in the last 72 hours. Thyroid Function Tests: No results for input(s): TSH, T4TOTAL, T3FREE, THYROIDAB in the last 72 hours.  Invalid input(s): FREET3 Anemia Panel: No results for input(s): VITAMINB12, FOLATE, FERRITIN, TIBC, IRON, RETICCTPCT in the last 72 hours.  MR LUMBAR SPINE WO CONTRAST  Result Date: 05/06/2020 CLINICAL DATA:  Initial evaluation for acute trauma, back pain. EXAM: MRI LUMBAR SPINE WITHOUT CONTRAST TECHNIQUE: Multiplanar, multisequence MR imaging of the lumbar spine was performed. No intravenous contrast was administered. COMPARISON:  Prior radiograph from 05/05/2020 FINDINGS: Segmentation: Standard. Lowest well-formed disc space labeled the L5-S1 level. Alignment: Trace 2 mm anterolisthesis of L3 on L4, with 7 mm  anterolisthesis of L4 on L5. Findings chronic and facet mediated. Alignment otherwise normal with preservation of the normal lumbar lordosis. Vertebrae: Acute compression fracture involving the superior endplate of L1. Associated central height loss of up to 20% without bony retropulsion. Additional acute compression fracture involving the superior endplate of L2. Associated 30% height loss with trace 2 mm bony retropulsion. These fractures are benign/mechanical in appearance. Otherwise, vertebral body height maintained with no other acute or chronic fracture. Underlying bone marrow signal intensity within normal limits. No discrete or worrisome osseous lesions. Mild reactive marrow edema noted about the right L4-5 facet due to facet arthritis. No other abnormal marrow edema. Conus medullaris and cauda equina: Conus extends to the L1-2 level. Conus and cauda equina appear normal.  Paraspinal and other soft tissues: Mild paraspinous edema adjacent to the L1 and L2 compression fractures. Mild edema noted within the lower posterior paraspinous musculature as well, likely reflecting a degree of associated muscular injury/strain. Few benign appearing cyst noted within the kidneys bilaterally. Visualized visceral structures otherwise unremarkable. Disc levels: T12-L1: Mild disc bulge with disc desiccation. Superimposed small right foraminal to extraforaminal disc protrusion (series 8, image 3). No significant spinal stenosis. Foramina remain patent. L1-2: Mild disc bulge with disc desiccation. Trace 2 mm bony retropulsion related to the L2 compression fracture. Mild facet hypertrophy. No significant spinal stenosis. Foramina remain patent. L2-3: Diffuse disc bulge with disc desiccation. Mild facet and ligament flavum hypertrophy. No more than mild narrowing of the lateral recesses bilaterally. Central canal remains patent. No significant foraminal stenosis. L3-4: Trace anterolisthesis. Mild disc bulge with disc desiccation. Mild facet and ligament flavum hypertrophy. No significant spinal stenosis. Mild left L3 foraminal narrowing. Right neural foramen remains patent. L4-5: 7 mm anterolisthesis. Associated broad posterior pseudo disc uncovering, asymmetric to the left. Moderate facet and ligament flavum hypertrophy. Resultant mild to moderate left greater than right lateral recess stenosis. Central canal remains patent. Mild to moderate left greater than right L4 foraminal narrowing. L5-S1: Mild disc bulge with disc desiccation. Moderate right with mild left facet hypertrophy. No significant spinal stenosis. Mild right L5 foraminal narrowing. Left neural foramina remains patent. IMPRESSION: 1. Acute compression fracture involving the superior endplate of L1 with associated 20% height loss without bony retropulsion. 2. Acute compression fracture involving the superior endplate of L2 with associated  30% height loss with trace 2 mm bony retropulsion. No significant stenosis. 3. 7 mm anterolisthesis of L4 on L5 with associated disc bulge and facet hypertrophy, resulting in mild to moderate left greater than right lateral recess and foraminal stenosis. Electronically Signed   By: Rise Mu M.D.   On: 05/06/2020 21:20   US Venous Img Lower Unilateral Right (DVT)  Result Date: 05/05/2020 CLINICAL DATA:  Right lower extremity pain and edema. EXAM: RIGHT LOWER EXTREMITY VENOUS DOPPLER ULTRASOUND TECHNIQUE: Gray-scale sonography with graded compression, as well as color Doppler and duplex ultrasound were performed to evaluate the lower extremity deep venous systems from the level of the common femoral vein and including the common femoral, femoral, profunda femoral, popliteal and calf veins including the posterior tibial, peroneal and gastrocnemius veins when visible. The superficial great saphenous vein was also interrogated. Spectral Doppler was utilized to evaluate flow at rest and with distal augmentation maneuvers in the common femoral, femoral and popliteal veins. COMPARISON:  None. FINDINGS: Contralateral Common Femoral Vein: Respiratory phasicity is normal and symmetric with the symptomatic side. No evidence of thrombus. Normal compressibility. Common Femoral Vein: No evidence of thrombus. Normal  compressibility, respiratory phasicity and response to augmentation. Saphenofemoral Junction: No evidence of thrombus. Normal compressibility and flow on color Doppler imaging. Profunda Femoral Vein: No evidence of thrombus. Normal compressibility and flow on color Doppler imaging. Femoral Vein: No evidence of thrombus. Normal compressibility, respiratory phasicity and response to augmentation. Popliteal Vein: No evidence of thrombus. Normal compressibility, respiratory phasicity and response to augmentation. Calf Veins: No evidence of thrombus. Normal compressibility and flow on color Doppler imaging.  Superficial Great Saphenous Vein: No evidence of thrombus. Normal compressibility. Venous Reflux:  None. Other Findings: No evidence of superficial thrombophlebitis or abnormal fluid collection. IMPRESSION: No evidence of right lower extremity deep venous thrombosis. Electronically Signed   By: Irish Lack M.D.   On: 05/05/2020 12:43   ECHOCARDIOGRAM COMPLETE  Result Date: 05/06/2020    ECHOCARDIOGRAM REPORT   Patient Name:   Tracy Singh Date of Exam: 05/06/2020 Medical Rec #:  [id number]            Height:       64.0 in Accession #:    [id number]           Weight:       149.0 lb Date of Birth:  02-May-1924            BSA:          1.726 m Patient Age:    87 years             BP:           122/48 mmHg Patient Gender: F                    HR:           104 bpm. Exam Location:  [facility] Procedure: 2D Echo, Color Doppler and Cardiac Doppler Indications:     I48.91 Atrial fibrillation  History:         Patient has no prior history of Echocardiogram examinations.                  Sepsis.  Sonographer:     Humphrey Rolls RDCS (AE) Referring Phys:  [id number] Vernetta Honey MANSY Diagnosing Phys: Marcina Millard MD  Sonographer Comments: Suboptimal apical window and suboptimal subcostal window. IMPRESSIONS  1. Left ventricular ejection fraction, by estimation, is 60 to 65%. The left ventricle has normal function. The left ventricle has no regional wall motion abnormalities. Left ventricular diastolic parameters are indeterminate.  2. Right ventricular systolic function is normal. The right ventricular size is normal.  3. The mitral valve is normal in structure. Mild to moderate mitral valve regurgitation. No evidence of mitral stenosis.  4. The aortic valve is normal in structure. Aortic valve regurgitation is not visualized. No aortic stenosis is present.  5. The inferior vena cava is normal in size with greater than 50% respiratory variability, suggesting right atrial pressure of 3 mmHg. FINDINGS  Left Ventricle: Left  ventricular ejection fraction, by estimation, is 60 to 65%. The left ventricle has normal function. The left ventricle has no regional wall motion abnormalities. The left ventricular internal cavity size was normal in size. There is  no left ventricular hypertrophy. Left ventricular diastolic parameters are indeterminate. Right Ventricle: The right ventricular size is normal. No increase in right ventricular wall thickness. Right ventricular systolic function is normal. Left Atrium: Left atrial size was normal in size. Right Atrium: Right atrial size was normal in size. Pericardium: Trivial pericardial effusion is present. Mitral Valve: The mitral  valve is normal in structure. Mild to moderate mitral valve regurgitation. No evidence of mitral valve stenosis. MV peak gradient, 9.6 mmHg. The mean mitral valve gradient is 4.0 mmHg. Tricuspid Valve: The tricuspid valve is normal in structure. Tricuspid valve regurgitation is mild . No evidence of tricuspid stenosis. Aortic Valve: The aortic valve is normal in structure. Aortic valve regurgitation is not visualized. No aortic stenosis is present. Aortic valve mean gradient measures 3.0 mmHg. Aortic valve peak gradient measures 6.2 mmHg. Aortic valve area, by VTI measures 2.06 cm. Pulmonic Valve: The pulmonic valve was normal in structure. Pulmonic valve regurgitation is not visualized. No evidence of pulmonic stenosis. Aorta: The aortic root is normal in size and structure. Venous: The inferior vena cava is normal in size with greater than 50% respiratory variability, suggesting right atrial pressure of 3 mmHg. IAS/Shunts: No atrial level shunt detected by color flow Doppler.  LEFT VENTRICLE PLAX 2D LVIDd:         4.50 cm  Diastology LVIDs:         2.90 cm  LV e' medial:    8.05 cm/s LV PW:         1.10 cm  LV E/e' medial:  16.5 LV IVS:        0.80 cm  LV e' lateral:   11.30 cm/s LVOT diam:     1.80 cm  LV E/e' lateral: 11.7 LV SV:         45 LV SV Index:   26 LVOT  Area:     2.54 cm  RIGHT VENTRICLE RV Basal diam:  3.40 cm LEFT ATRIUM           Index LA diam:      4.20 cm 2.43 cm/m LA Vol (A2C): 38.2 ml 22.13 ml/m LA Vol (A4C): 91.8 ml 53.18 ml/m  AORTIC VALVE                   PULMONIC VALVE AV Area (Vmax):    2.04 cm    PV Vmax:       0.83 m/s AV Area (Vmean):   2.13 cm    PV Vmean:      55.900 cm/s AV Area (VTI):     2.06 cm    PV VTI:        0.152 m AV Vmax:           125.00 cm/s PV Peak grad:  2.8 mmHg AV Vmean:          87.400 cm/s PV Mean grad:  1.0 mmHg AV VTI:            0.217 m AV Peak Grad:      6.2 mmHg AV Mean Grad:      3.0 mmHg LVOT Vmax:         100.00 cm/s LVOT Vmean:        73.300 cm/s LVOT VTI:          0.176 m LVOT/AV VTI ratio: 0.81  AORTA Ao Root diam: 2.70 cm MITRAL VALVE                TRICUSPID VALVE MV Area (PHT): 3.97 cm     TR Peak grad:   26.4 mmHg MV Area VTI:   1.77 cm     TR Vmax:        257.00 cm/s MV Peak grad:  9.6 mmHg MV Mean grad:  4.0 mmHg     SHUNTS MV Vmax:  1.55 m/s     Systemic VTI:  0.18 m MV Vmean:      85.4 cm/s    Systemic Diam: 1.80 cm MV Decel Time: 191 msec MV E velocity: 132.50 cm/s Marcina Millard MD Electronically signed by Marcina Millard MD Signature Date/Time: 05/06/2020/12:09:45 PM    Final      Echo LVEF 60-65%  TELEMETRY: atrial fibrillation 88-93 bpm  ASSESSMENT AND PLAN:  Active Problems:   Atrial fibrillation with rapid ventricular response (HCC)   1. Atrial fibrillation with RVR, with a history of chronic atrial fibrillation on Pradaxa and metoprolol with no missed doses; rate is likely compensatory in setting of acute low back pain. Cardizem drip discontinued; rates are now 88-93 bpm on oral Cardizem and Metoprolol 2. L2 compression fracture 3. Acute on chronic CHF, previous echo in 2018 showed; currently on IV Lasix, overall improving 4. Hypertensive urgency, improved  Plan: 1. Switch to Cardizem CD 120 mg daily; give this evening 2. Discontinue IV Lasix today; switch to  oral 20 mg once daily tomorrow 3. Continue Pradaxa 4. Continue metoprolol succinate; reduce dose to 25 today as blood pressure is low; will up-titrate as blood pressure/heart rate permits  5. Defer further cardiac diagnostics; sign off for now; please call or [facility] with questions.  Leanora Ivanoff, PA-C 05/07/2020 9:09 AM

## 2020-05-07 NOTE — Evaluation (Signed)
Occupational Therapy Evaluation Patient Details Name: Tracy Singh MRN: [id number] DOB: [birth year] Today's Date: 05/07/2020    History of Present Illness Pt is a 87 y/o F with PMH: with Afib on Pradaxa and Von Willebrand disease. Pt presented d/t chief c/o Low back pain (stated started 6WA and was started on muscle relaxers by Tracy Singh). Pt with increased pain starting on 4/11 and presented to ED. L-spine XR from 4/12 revealed L2 compression fracture of unknown duration. Pt recommended to wear LSO with ambulation and is currently being managed conservatively, but neurosx consulted for potential kyphoplasty.   Clinical Impression   Pt seen for OT evaluation this date in setting of acute hospitalization d/t back pain with no recent trauma reported. Pt lives with her daughter in apartment built onto her dtr's home with 11 steps to enter. Pt is typically INDEP for all BADLs and fxl mobility, even working out with her daughter 3-4x/wk up until about 6WA when back pain seemingly started per pt/dtr-Tracy Singh). Pt presents this date with pain and back precautions limiting her functional activity tolerance. Pt requires MOD/MAX A with side-lying<>sit transition and ed re: logroll technique. OT engages pt in seated ADLs for which pt currently requires SETUP to MIN A for seated UB ADLs, MAX A for seated LB ADLs d/t not able to bend (back precautions) and limited hip ROM to compensate. LSO not present in room so standing and fxl mobility deferred at this time. (RN and neurosx MD aware that it has not yet been delivered, but it has been ordered and called for). OT engages pt in gentle seated therex w/in her tolerance (see below) and pt is returned to bed with all needs met and in reach. As pt is very limited at this time with her ADLs and tolerance for mobilization, anticipate she will require STR f/u in SNF setting upon d/c from hospital. Will continue to follow acutely.     Follow Up Recommendations  SNF     Equipment Recommendations  3 in 1 bedside commode;Tub/shower seat    Recommendations for Other Services       Precautions / Restrictions Precautions Precautions: Back;Fall Required Braces or Orthoses: Spinal Brace Spinal Brace: Lumbar corset Restrictions Weight Bearing Restrictions: No Other Position/Activity Restrictions: per secure chat with Tracy Singh, pt requires LSO for ambulation, but able to sit EOB w/o.      Mobility Bed Mobility Overal bed mobility: Needs Assistance Bed Mobility: Sidelying to Sit;Sit to Sidelying   Sidelying to sit: Mod assist     Sit to sidelying: Mod assist;Max assist General bed mobility comments: increased assist to manage trunk/LEs back to bed    Transfers                 General transfer comment: deferred d/t LSO has not yet arrived.    Balance Overall balance assessment: Needs assistance   Sitting balance-Leahy Scale: Fair Sitting balance - Comments: UE support to sustain static sit                                   ADL either performed or assessed with clinical judgement   ADL                                         General ADL Comments: requires SETUP to MIN A for  seated UB ADLs, MAX A for seated LB ADLs d/t not able to bend (back precautions) and limited hip ROM to compensate.     Vision Baseline Vision/History: Wears glasses Patient Visual Report: No change from baseline       Perception     Praxis      Pertinent Vitals/Pain Pain Assessment: Faces Faces Pain Scale: Hurts even more Pain Location: lower back Pain Descriptors / Indicators: Pressure;Sore Pain Intervention(s): Limited activity within patient's tolerance;Monitored during session;Other (comment) (logroll for bed mobility)     Hand Dominance     Extremity/Trunk Assessment Upper Extremity Assessment Upper Extremity Assessment: Generalized weakness   Lower Extremity Assessment Lower Extremity Assessment:  Generalized weakness       Communication Communication Communication: HOH   Cognition Arousal/Alertness: Awake/alert Behavior During Therapy: WFL for tasks assessed/performed Overall Cognitive Status: Within Functional Limits for tasks assessed                                 General Comments: pt is HOH so some delayed responses, but overall appropriate conversationally and appropriate with following commands.   General Comments       Exercises Other Exercises Other Exercises: OT educates pt and dtr who is present throughout re: benefits of OOB activity especially as pt is experiencing nausea and indigestion. Pt does report relief in EOB sitting and burps several times. In addition, RN administers zofran. Pt and dtr very receptive to increasing pt's activity as tolerable Other Exercises: OT engages pt in b/l ankle pumps x10 for 2 sets and engages pt in 2 sets cervical spine extension to improve sitting posture (as tolerable/mindful of lower back) with scapular retraction for 10 reps with ~2 second hold. Pt tolerates well.   Shoulder Instructions      Home Living Family/patient expects to be discharged to:: Private residence Living Arrangements: Children (dtr) Available Help at Discharge: Family;Available 24 hours/day Type of Home: House (lives in in-law suite on her daughter's house that is set up to be a private apartment.) Home Access: Stairs to enter Technical brewer of Steps: 11 to 13 Entrance Stairs-Rails: Right Home Layout: One level               Home Equipment: Environmental consultant - 2 wheels;Grab bars - tub/shower          Prior Functioning/Environment Level of Independence: Needs assistance  Gait / Transfers Assistance Needed: pt was able to ambulate w/o assistance for short HH distances, but increased difficulty/decreased tolerance since back pain started ~6WA ADL's / Homemaking Assistance Needed: Dtr assists with IADLs including getting groceries, pt  able to manage basic ADLs.   Comments: Pt and daughter were working out together 3-4 times a week        OT Problem List: Decreased strength;Decreased range of motion;Decreased activity tolerance;Impaired balance (sitting and/or standing);Decreased knowledge of use of DME or AE;Decreased knowledge of precautions;Cardiopulmonary status limiting activity;Pain      OT Treatment/Interventions: Self-care/ADL training;DME and/or AE instruction;Therapeutic activities;Balance training;Therapeutic exercise;Energy conservation;Patient/family education    OT Goals(Current goals can be found in the care plan section) Acute Rehab OT Goals Patient Stated Goal: to get this nausea to quit and my pain better so I can do more OT Goal Formulation: With patient Time For Goal Achievement: 05/21/20 Potential to Achieve Goals: Good  OT Frequency: Min 2X/week   Barriers to D/C:  Co-evaluation              AM-PAC OT "6 Clicks" Daily Activity     Outcome Measure Help from another person eating meals?: None Help from another person taking care of personal grooming?: A Little Help from another person toileting, which includes using toliet, bedpan, or urinal?: A Lot Help from another person bathing (including washing, rinsing, drying)?: A Lot Help from another person to put on and taking off regular upper body clothing?: A Little Help from another person to put on and taking off regular lower body clothing?: A Lot 6 Click Score: 16   End of Session Equipment Utilized During Treatment: Oxygen Nurse Communication: Mobility status;Other (comment) (nausea)  Activity Tolerance: Patient tolerated treatment well;Patient limited by pain;Other (comment) (tx limited 2/2 needing LSO for amb) Patient left: in bed;with call bell/phone within reach;with bed alarm set;with family/visitor present  OT Visit Diagnosis: Unsteadiness on feet (R26.81);Muscle weakness (generalized) (M62.81);Pain Pain - part  of body:  (back)                Time: 8264-1583 OT Time Calculation (min): 64 min Charges:  OT General Charges $OT Visit: 1 Visit OT Evaluation $OT Eval Moderate Complexity: 1 Mod OT Treatments $Self Care/Home Management : 23-37 mins $Therapeutic Activity: 23-37 mins $Therapeutic Exercise: 8-22 mins  Gerrianne Scale, MS, OTR/L ascom [phone] 05/07/20, 7:12 PM

## 2020-05-07 NOTE — Progress Notes (Signed)
PT Cancellation Note  Patient Details Name: Tracy Singh MRN: [id number] DOB: 08/09/1924   Cancelled Treatment:    Reason Eval/Treat Not Completed: Other (comment): PT evaluation attempted this AM but upon entering room it was noted that pt's LSO had not yet been delivered. Will attempt to see pt at a future date/time as medically appropriate.     Ovidio Hanger PT, DPT 05/07/20, 1:04 PM

## 2020-05-07 NOTE — Progress Notes (Signed)
PROGRESS NOTE    Tracy Sleighherese Y Legros  ZOX:[id number] DOB: 12/22/1924 DOA: 05/05/2020 PCP: Reubin MilanBerglund, Laura H, MD    Brief Narrative:  Tracy Singh is a 87 y.o. Caucasian female with medical history significant for atrial fibrillation on Pradaxa and von Willebrand disease, who presented to the emergency room with acute onset of low back pain for which she was recently seen by Dr. Martha ClanKrasinski and prescribed Robaxin with mild help.  She has been having urinary frequency and urgency and felt to have a UTI.  She was seen by her cardiologist and given p.o. Cipro. She does have bilateral lower extremity edema.  Her pulse extremities dropped to 85% on room air in the ER and she was placed on 2 L of O2 by nasal cannula.ED Course: Upon presentation to the emergency room blood pressure was 186/152 with a heart rate of 137 and she was in rapid atrial fibrillation.  Labs revealed mild hyponatremia.BNP was 724.5 and high-sensitivity troponin I was 11 and later 9.  Influenza antigens and COVID-19 PCR came back negative   EKG was atrial fibrillation with RVR.  Lumbar spine x-ray showed L2 superior endplate fracture with approximately 30 to 40% loss of height and moderate degenerative disc disease at L4-L5 with cystic grade 1 anterolisthesis as well as cholelithiasis.  T-spine x-ray showed no acute abnormalities.   4/14- awaiting brace.   Consultants:   Cardiology, neurosurgery  Procedures:   Antimicrobials:       Subjective: This am pt without complaints. Pain controlled. Did have pain when had MRI  Objective: Vitals:   05/06/20 2317 05/07/20 0508 05/07/20 0517 05/07/20 0748  BP: (!) 126/50 (!) 146/93  (!) 167/77  Pulse:  69  87  Resp:  18  16  Temp:    98.4 F (36.9 C)  TempSrc:    Oral  SpO2:  98%  100%  Weight:   61.7 kg   Height:        Intake/Output Summary (Last 24 hours) at 05/07/2020 0832 Last data filed at 05/07/2020 0753 Gross per 24 hour  Intake 289.77 ml  Output 3400 ml   Net -3110.23 ml   Filed Weights   05/05/20 0039 05/07/20 0517  Weight: 67.6 kg 61.7 kg    Examination:  Nad, calm Cta no w/r/r Regular s1/s2 Soft benign +bs No edema Awake alert, grossly intact Mood and affect appropriate in current setting   Data Reviewed: I have personally reviewed following labs and imaging studies  CBC: Recent Labs  Lab 05/05/20 0043 05/05/20 0517  WBC 7.0 9.0  NEUTROABS 4.5  --   HGB 14.1 13.7  HCT 41.1 40.3  MCV 91.5 92.6  PLT 279 266   Basic Metabolic Panel: Recent Labs  Lab 05/05/20 0043 05/05/20 0045 05/05/20 0517 05/06/20 0556 05/07/20 0613  NA 133*  --  133* 132* 132*  K 4.1  --  3.9 3.8 3.5  CL 100  --  99 91* 91*  CO2 23  --  25 30 32  GLUCOSE 148*  --  151* 110* 113*  BUN 16  --  13 14 15   CREATININE 0.69  --  0.60 0.85 0.78  CALCIUM 9.2  --  8.8* 9.3 8.8*  MG  --  2.0  --   --   --    GFR: Estimated Creatinine Clearance: 37.9 mL/min (by C-G formula based on SCr of 0.78 mg/dL). Liver Function Tests: Recent Labs  Lab 05/05/20 0043  AST 26  ALT 15  ALKPHOS 83  BILITOT 0.8  PROT 7.5  ALBUMIN 3.9   No results for input(s): LIPASE, AMYLASE in the last 168 hours. No results for input(s): AMMONIA in the last 168 hours. Coagulation Profile: Recent Labs  Lab 05/05/20 0043  INR 1.8*   Cardiac Enzymes: No results for input(s): CKTOTAL, CKMB, CKMBINDEX, TROPONINI in the last 168 hours. BNP (last 3 results) No results for input(s): PROBNP in the last 8760 hours. HbA1C: No results for input(s): HGBA1C in the last 72 hours. CBG: No results for input(s): GLUCAP in the last 168 hours. Lipid Profile: No results for input(s): CHOL, HDL, LDLCALC, TRIG, CHOLHDL, LDLDIRECT in the last 72 hours. Thyroid Function Tests: No results for input(s): TSH, T4TOTAL, FREET4, T3FREE, THYROIDAB in the last 72 hours. Anemia Panel: No results for input(s): VITAMINB12, FOLATE, FERRITIN, TIBC, IRON, RETICCTPCT in the last 72 hours. Sepsis  Labs: No results for input(s): PROCALCITON, LATICACIDVEN in the last 168 hours.  Recent Results (from the past 240 hour(s))  Resp Panel by RT-PCR (Flu A&B, Covid) Nasopharyngeal Swab     Status: None   Collection Time: 05/05/20  1:26 AM   Specimen: Nasopharyngeal Swab; Nasopharyngeal(NP) swabs in vial transport medium  Result Value Ref Range Status   SARS Coronavirus 2 by RT PCR NEGATIVE NEGATIVE Final    Comment: (NOTE) SARS-CoV-2 target nucleic acids are NOT DETECTED.  The SARS-CoV-2 RNA is generally detectable in upper respiratory specimens during the acute phase of infection. The lowest concentration of SARS-CoV-2 viral copies this assay can detect is 138 copies/mL. A negative result does not preclude SARS-Cov-2 infection and should not be used as the sole basis for treatment or other patient management decisions. A negative result may occur with  improper specimen collection/handling, submission of specimen other than nasopharyngeal swab, presence of viral mutation(s) within the areas targeted by this assay, and inadequate number of viral copies(<138 copies/mL). A negative result must be combined with clinical observations, patient history, and epidemiological information. The expected result is Negative.  Fact Sheet for Patients:  BloggerCourse.com  Fact Sheet for Healthcare Providers:  SeriousBroker.it  This test is no t yet approved or cleared by the [place] FDA and  has been authorized for detection and/or diagnosis of SARS-CoV-2 by FDA under an Emergency Use Authorization (EUA). This EUA will remain  in effect (meaning this test can be used) for the duration of the COVID-19 declaration under Section 564(b)(1) of the Act, 21 U.S.C.section 360bbb-3(b)(1), unless the authorization is terminated  or revoked sooner.       Influenza A by PCR NEGATIVE NEGATIVE Final   Influenza B by PCR NEGATIVE NEGATIVE Final     Comment: (NOTE) The Xpert Xpress SARS-CoV-2/FLU/RSV plus assay is intended as an aid in the diagnosis of influenza from Nasopharyngeal swab specimens and should not be used as a sole basis for treatment. Nasal washings and aspirates are unacceptable for Xpert Xpress SARS-CoV-2/FLU/RSV testing.  Fact Sheet for Patients: BloggerCourse.com  Fact Sheet for Healthcare Providers: SeriousBroker.it  This test is not yet approved or cleared by the [place] FDA and has been authorized for detection and/or diagnosis of SARS-CoV-2 by FDA under an Emergency Use Authorization (EUA). This EUA will remain in effect (meaning this test can be used) for the duration of the COVID-19 declaration under Section 564(b)(1) of the Act, 21 U.S.C. section 360bbb-3(b)(1), unless the authorization is terminated or revoked.  Performed at [facility], [address], [place], [place] [address]  Radiology Studies: MR LUMBAR SPINE WO CONTRAST  Result Date: 05/06/2020 CLINICAL DATA:  Initial evaluation for acute trauma, back pain. EXAM: MRI LUMBAR SPINE WITHOUT CONTRAST TECHNIQUE: Multiplanar, multisequence MR imaging of the lumbar spine was performed. No intravenous contrast was administered. COMPARISON:  Prior radiograph from 05/05/2020 FINDINGS: Segmentation: Standard. Lowest well-formed disc space labeled the L5-S1 level. Alignment: Trace 2 mm anterolisthesis of L3 on L4, with 7 mm anterolisthesis of L4 on L5. Findings chronic and facet mediated. Alignment otherwise normal with preservation of the normal lumbar lordosis. Vertebrae: Acute compression fracture involving the superior endplate of L1. Associated central height loss of up to 20% without bony retropulsion. Additional acute compression fracture involving the superior endplate of L2. Associated 30% height loss with trace 2 mm bony retropulsion. These fractures are benign/mechanical in  appearance. Otherwise, vertebral body height maintained with no other acute or chronic fracture. Underlying bone marrow signal intensity within normal limits. No discrete or worrisome osseous lesions. Mild reactive marrow edema noted about the right L4-5 facet due to facet arthritis. No other abnormal marrow edema. Conus medullaris and cauda equina: Conus extends to the L1-2 level. Conus and cauda equina appear normal. Paraspinal and other soft tissues: Mild paraspinous edema adjacent to the L1 and L2 compression fractures. Mild edema noted within the lower posterior paraspinous musculature as well, likely reflecting a degree of associated muscular injury/strain. Few benign appearing cyst noted within the kidneys bilaterally. Visualized visceral structures otherwise unremarkable. Disc levels: T12-L1: Mild disc bulge with disc desiccation. Superimposed small right foraminal to extraforaminal disc protrusion (series 8, image 3). No significant spinal stenosis. Foramina remain patent. L1-2: Mild disc bulge with disc desiccation. Trace 2 mm bony retropulsion related to the L2 compression fracture. Mild facet hypertrophy. No significant spinal stenosis. Foramina remain patent. L2-3: Diffuse disc bulge with disc desiccation. Mild facet and ligament flavum hypertrophy. No more than mild narrowing of the lateral recesses bilaterally. Central canal remains patent. No significant foraminal stenosis. L3-4: Trace anterolisthesis. Mild disc bulge with disc desiccation. Mild facet and ligament flavum hypertrophy. No significant spinal stenosis. Mild left L3 foraminal narrowing. Right neural foramen remains patent. L4-5: 7 mm anterolisthesis. Associated broad posterior pseudo disc uncovering, asymmetric to the left. Moderate facet and ligament flavum hypertrophy. Resultant mild to moderate left greater than right lateral recess stenosis. Central canal remains patent. Mild to moderate left greater than right L4 foraminal narrowing.  L5-S1: Mild disc bulge with disc desiccation. Moderate right with mild left facet hypertrophy. No significant spinal stenosis. Mild right L5 foraminal narrowing. Left neural foramina remains patent. IMPRESSION: 1. Acute compression fracture involving the superior endplate of L1 with associated 20% height loss without bony retropulsion. 2. Acute compression fracture involving the superior endplate of L2 with associated 30% height loss with trace 2 mm bony retropulsion. No significant stenosis. 3. 7 mm anterolisthesis of L4 on L5 with associated disc bulge and facet hypertrophy, resulting in mild to moderate left greater than right lateral recess and foraminal stenosis. Electronically Signed   By: Rise Mu M.D.   On: 05/06/2020 21:20   US Venous Img Lower Unilateral Right (DVT)  Result Date: 05/05/2020 CLINICAL DATA:  Right lower extremity pain and edema. EXAM: RIGHT LOWER EXTREMITY VENOUS DOPPLER ULTRASOUND TECHNIQUE: Gray-scale sonography with graded compression, as well as color Doppler and duplex ultrasound were performed to evaluate the lower extremity deep venous systems from the level of the common femoral vein and including the common femoral, femoral, profunda femoral, popliteal and calf veins  including the posterior tibial, peroneal and gastrocnemius veins when visible. The superficial great saphenous vein was also interrogated. Spectral Doppler was utilized to evaluate flow at rest and with distal augmentation maneuvers in the common femoral, femoral and popliteal veins. COMPARISON:  None. FINDINGS: Contralateral Common Femoral Vein: Respiratory phasicity is normal and symmetric with the symptomatic side. No evidence of thrombus. Normal compressibility. Common Femoral Vein: No evidence of thrombus. Normal compressibility, respiratory phasicity and response to augmentation. Saphenofemoral Junction: No evidence of thrombus. Normal compressibility and flow on color Doppler imaging. Profunda  Femoral Vein: No evidence of thrombus. Normal compressibility and flow on color Doppler imaging. Femoral Vein: No evidence of thrombus. Normal compressibility, respiratory phasicity and response to augmentation. Popliteal Vein: No evidence of thrombus. Normal compressibility, respiratory phasicity and response to augmentation. Calf Veins: No evidence of thrombus. Normal compressibility and flow on color Doppler imaging. Superficial Great Saphenous Vein: No evidence of thrombus. Normal compressibility. Venous Reflux:  None. Other Findings: No evidence of superficial thrombophlebitis or abnormal fluid collection. IMPRESSION: No evidence of right lower extremity deep venous thrombosis. Electronically Signed   By: Irish Lack M.D.   On: 05/05/2020 12:43   ECHOCARDIOGRAM COMPLETE  Result Date: 05/06/2020    ECHOCARDIOGRAM REPORT   Patient Name:   MARRA FRAGA Date of Exam: 05/06/2020 Medical Rec #:  [id number]            Height:       64.0 in Accession #:    [id number]           Weight:       149.0 lb Date of Birth:  07/20/1924            BSA:          1.726 m Patient Age:    87 years             BP:           122/48 mmHg Patient Gender: F                    HR:           104 bpm. Exam Location:  [facility] Procedure: 2D Echo, Color Doppler and Cardiac Doppler Indications:     I48.91 Atrial fibrillation  History:         Patient has no prior history of Echocardiogram examinations.                  Sepsis.  Sonographer:     Humphrey Rolls RDCS (AE) Referring Phys:  [id number] Vernetta Honey MANSY Diagnosing Phys: Marcina Millard MD  Sonographer Comments: Suboptimal apical window and suboptimal subcostal window. IMPRESSIONS  1. Left ventricular ejection fraction, by estimation, is 60 to 65%. The left ventricle has normal function. The left ventricle has no regional wall motion abnormalities. Left ventricular diastolic parameters are indeterminate.  2. Right ventricular systolic function is normal. The right ventricular size is  normal.  3. The mitral valve is normal in structure. Mild to moderate mitral valve regurgitation. No evidence of mitral stenosis.  4. The aortic valve is normal in structure. Aortic valve regurgitation is not visualized. No aortic stenosis is present.  5. The inferior vena cava is normal in size with greater than 50% respiratory variability, suggesting right atrial pressure of 3 mmHg. FINDINGS  Left Ventricle: Left ventricular ejection fraction, by estimation, is 60 to 65%. The left ventricle has normal function. The left ventricle has no regional wall motion abnormalities.  The left ventricular internal cavity size was normal in size. There is  no left ventricular hypertrophy. Left ventricular diastolic parameters are indeterminate. Right Ventricle: The right ventricular size is normal. No increase in right ventricular wall thickness. Right ventricular systolic function is normal. Left Atrium: Left atrial size was normal in size. Right Atrium: Right atrial size was normal in size. Pericardium: Trivial pericardial effusion is present. Mitral Valve: The mitral valve is normal in structure. Mild to moderate mitral valve regurgitation. No evidence of mitral valve stenosis. MV peak gradient, 9.6 mmHg. The mean mitral valve gradient is 4.0 mmHg. Tricuspid Valve: The tricuspid valve is normal in structure. Tricuspid valve regurgitation is mild . No evidence of tricuspid stenosis. Aortic Valve: The aortic valve is normal in structure. Aortic valve regurgitation is not visualized. No aortic stenosis is present. Aortic valve mean gradient measures 3.0 mmHg. Aortic valve peak gradient measures 6.2 mmHg. Aortic valve area, by VTI measures 2.06 cm. Pulmonic Valve: The pulmonic valve was normal in structure. Pulmonic valve regurgitation is not visualized. No evidence of pulmonic stenosis. Aorta: The aortic root is normal in size and structure. Venous: The inferior vena cava is normal in size with greater than 50% respiratory  variability, suggesting right atrial pressure of 3 mmHg. IAS/Shunts: No atrial level shunt detected by color flow Doppler.  LEFT VENTRICLE PLAX 2D LVIDd:         4.50 cm  Diastology LVIDs:         2.90 cm  LV e' medial:    8.05 cm/s LV PW:         1.10 cm  LV E/e' medial:  16.5 LV IVS:        0.80 cm  LV e' lateral:   11.30 cm/s LVOT diam:     1.80 cm  LV E/e' lateral: 11.7 LV SV:         45 LV SV Index:   26 LVOT Area:     2.54 cm  RIGHT VENTRICLE RV Basal diam:  3.40 cm LEFT ATRIUM           Index LA diam:      4.20 cm 2.43 cm/m LA Vol (A2C): 38.2 ml 22.13 ml/m LA Vol (A4C): 91.8 ml 53.18 ml/m  AORTIC VALVE                   PULMONIC VALVE AV Area (Vmax):    2.04 cm    PV Vmax:       0.83 m/s AV Area (Vmean):   2.13 cm    PV Vmean:      55.900 cm/s AV Area (VTI):     2.06 cm    PV VTI:        0.152 m AV Vmax:           125.00 cm/s PV Peak grad:  2.8 mmHg AV Vmean:          87.400 cm/s PV Mean grad:  1.0 mmHg AV VTI:            0.217 m AV Peak Grad:      6.2 mmHg AV Mean Grad:      3.0 mmHg LVOT Vmax:         100.00 cm/s LVOT Vmean:        73.300 cm/s LVOT VTI:          0.176 m LVOT/AV VTI ratio: 0.81  AORTA Ao Root diam: 2.70 cm MITRAL VALVE  TRICUSPID VALVE MV Area (PHT): 3.97 cm     TR Peak grad:   26.4 mmHg MV Area VTI:   1.77 cm     TR Vmax:        257.00 cm/s MV Peak grad:  9.6 mmHg MV Mean grad:  4.0 mmHg     SHUNTS MV Vmax:       1.55 m/s     Systemic VTI:  0.18 m MV Vmean:      85.4 cm/s    Systemic Diam: 1.80 cm MV Decel Time: 191 msec MV E velocity: 132.50 cm/s Marcina Millard MD Electronically signed by Marcina Millard MD Signature Date/Time: 05/06/2020/12:09:45 PM    Final         Scheduled Meds: . dabigatran  150 mg Oral Q12H  . diltiazem  30 mg Oral Q6H  . furosemide  20 mg Intravenous Q12H  . lidocaine  1 patch Transdermal Q24H  . metoprolol succinate  50 mg Oral Daily  . multivitamin with minerals  1 tablet Oral Daily   Continuous  Infusions:  Assessment & Plan:   Active Problems:   Atrial fibrillation with rapid ventricular response (HCC)   1.  Atrial fibrillation with rapid ventricular response. Was on cardizem gtt, switched to cardizem CD  qd Continue pradaxa Echo nml EF F/u cardiology as outpt Continue toprol xl , cards reduced dose to  qd   2.Acute on chronic DHF- more euvolemic on exam Clinically improved HR and bp control Echo nml EF Iv lasix d/cd bnp down Lasix  po qd in am I/o , daily weight   2.  L2 compression fracture possibly pathological secondary to osteoporosis. Continue with pain management  Neurosurgery consulted, input was appreciated, will obtain L spine MRI Consider kyphoplasty if she cannot ambulate with LSBo 4/14- LSO PT/OT when brace on    4.  Acute respiratory failure with hypoxia, likely secondary to acute CHF. Improved Keep 02 sa >92% IS Lasix iv switched to po to start in am   5.  Hypertensive urgency, likely contributing to her acute CHF. improved Continue to monitor   6.  Recent UTI status post treatment with Cipro. -Urinalysis currently negative.  She finished taking Cipro.    DVT prophylaxis: Pradaxa Code Status: Full Family Communication: Daughter at bedside  Status is: Inpatient  Remains inpatient appropriate because:Inpatient level of care appropriate due to severity of illness   Dispo: The patient is from: Home              Anticipated d/c is to: TBD , likely SNF              Patient currently is not medically stable to d/c.   Difficult to place patient No            LOS: 2 days   Time spent: 35 minutes with more than 50% on COC    Lynn Ito, MD [facility] Pager [phone]  If 7PM-7AM, please contact night-coverage 05/07/2020, 8:32 AM

## 2020-05-07 NOTE — Progress Notes (Signed)
PT Cancellation Note  Patient Details Name: MARTENA EMANUELE MRN: [id number] DOB: 17-Jun-1924   Cancelled Treatment:    Reason Eval/Treat Not Completed: Fatigue/lethargy limiting ability to participate: Per nursing pt lethargic from recently receiving morphine and is not appropriate for PT services at this time.  Will attempt to see pt at a future date/time as medically appropriate.     Ovidio Hanger PT, DPT 05/07/20, 4:54 PM

## 2020-05-08 LAB — BASIC METABOLIC PANEL
Anion gap: 12 (ref 5–15)
BUN: 15 mg/dL (ref 8–23)
CO2: 30 mmol/L (ref 22–32)
Calcium: 9 mg/dL (ref 8.9–10.3)
Chloride: 86 mmol/L — ABNORMAL LOW (ref 98–111)
Creatinine, Ser: 0.82 mg/dL (ref 0.44–1.00)
GFR, Estimated: >60 mL/min (ref >60)
Glucose, Bld: 130 mg/dL — ABNORMAL HIGH (ref 70–99)
Potassium: 3.9 mmol/L (ref 3.5–5.1)
Sodium: 128 mmol/L — ABNORMAL LOW (ref 135–145)

## 2020-05-08 LAB — CBC
HCT: 45.6 % (ref 36.0–46.0)
Hemoglobin: 15.8 g/dL — ABNORMAL HIGH (ref 12.0–15.0)
MCH: 31.2 pg (ref 26.0–34.0)
MCHC: 34.6 g/dL (ref 30.0–36.0)
MCV: 90.1 fL (ref 80.0–100.0)
Platelets: 288 10*3/uL (ref 150–400)
RBC: 5.06 MIL/uL (ref 3.87–5.11)
RDW: 13.2 % (ref 11.5–15.5)
WBC: 8.4 10*3/uL (ref 4.0–10.5)
nRBC: 0 % (ref 0.0–0.2)

## 2020-05-08 MED ORDER — METOPROLOL SUCCINATE ER 25 MG PO TB24
25.0000 mg | ORAL_TABLET | Freq: Once | ORAL | Status: AC
  Administered 2020-05-08: 25 mg via ORAL
  Filled 2020-05-08: qty 1

## 2020-05-08 MED ORDER — METOPROLOL SUCCINATE ER 25 MG PO TB24
25.0000 mg | ORAL_TABLET | Freq: Two times a day (BID) | ORAL | Status: DC
  Administered 2020-05-09: 25 mg via ORAL
  Filled 2020-05-08: qty 1

## 2020-05-08 NOTE — Care Management Important Message (Signed)
Important Message  Patient Details  Name: [name] MRN: [id number] Date of Birth: [birth year]   Medicare Important Message Given:  Yes     Johnell Comings 05/08/2020, 12:28 PM

## 2020-05-08 NOTE — Progress Notes (Signed)
PROGRESS NOTE    Tracy DIDION  Singh:[id number] DOB: Jan 29, 1924 DOA: 05/05/2020 PCP: Tracy Milan, MD    Brief Narrative:  Tracy Singh is a 87 y.o. Caucasian female with medical history significant for atrial fibrillation on Pradaxa and von Willebrand disease, who presented to the emergency room with acute onset of low back pain for which she was recently seen by Dr. Martha Clan and prescribed Robaxin with mild help.  She has been having urinary frequency and urgency and felt to have a UTI.  She was seen by her cardiologist and given p.o. Cipro. She does have bilateral lower extremity edema.  Her pulse extremities dropped to 85% on room air in the ER and she was placed on 2 L of O2 by nasal cannula.ED Course: Upon presentation to the emergency room blood pressure was 186/152 with a heart rate of 137 and she was in rapid atrial fibrillation.  Labs revealed mild hyponatremia.BNP was 724.5 and high-sensitivity troponin I was 11 and later 9.  Influenza antigens and COVID-19 PCR came back negative   EKG was atrial fibrillation with RVR.  Lumbar spine x-ray showed L2 superior endplate fracture with approximately 30 to 40% loss of height and moderate degenerative disc disease at L4-L5 with cystic grade 1 anterolisthesis as well as cholelithiasis.  T-spine x-ray showed no acute abnormalities.   4/14- awaiting brace. 4/15-PT rec. SNF. Brace here. Pt with no new complaints. No overnight issues.  On telemetry with physical therapy heart rate increased to the 120s.  Given extra dose of Toprol-XL 25.  Consultants:   Cardiology, neurosurgery  Procedures:   Antimicrobials:       Subjective: No sob, cp, abd pain.   Objective: Vitals:   05/07/20 1550 05/07/20 2211 05/08/20 0400 05/08/20 0816  BP: (!) 142/79 (!) 141/93 (!) 126/59 136/66  Pulse: 83 96 86 94  Resp:  Temp:  98 F (36.7 C) 97.8 F (36.6 C) 98.2 F (36.8 C)  TempSrc:  Oral Oral Oral  SpO2: 96% 96% 95% 94%   Weight:      Height:        Intake/Output Summary (Last 24 hours) at 05/08/2020 0848 Last data filed at 05/08/2020 0700 Gross per 24 hour  Intake 480 ml  Output 700 ml  Net -220 ml   Filed Weights   05/05/20 0039 05/07/20 0517  Weight: 67.6 kg 61.7 kg    Examination: NAD, calm CTA, no wheeze rales rhonchi's Irregular, S1-S2 no gallops mildly tachycardic Soft benign positive bowel sounds No edema Awake and alert, grossly intact Mood and affect appropriate in current setting    Data Reviewed: I have personally reviewed following labs and imaging studies  CBC: Recent Labs  Lab 05/05/20 0043 05/05/20 0517 05/08/20 0720  WBC 7.0 9.0 8.4  NEUTROABS 4.5  --   --   HGB 14.1 13.7 15.8*  HCT 41.1 40.3 45.6  MCV 91.5 92.6 90.1  PLT 279 266 288   Basic Metabolic Panel: Recent Labs  Lab 05/05/20 0043 05/05/20 0045 05/05/20 0517 05/06/20 0556 05/07/20 0613 05/08/20 0720  NA 133*  --  133* 132* 132* 128*  K 4.1  --  3.9 3.8 3.5 3.9  CL 100  --  99 91* 91* 86*  CO2 23  --  25 30 32 30  GLUCOSE 148*  --  151* 110* 113* 130*  BUN 16  --  CREATININE 0.69  --  0.60 0.85 0.78  0.82  CALCIUM 9.2  --  8.8* 9.3 8.8* 9.0  MG  --  2.0  --   --   --   --    GFR: Estimated Creatinine Clearance: 37 mL/min (by C-G formula based on SCr of 0.82 mg/dL). Liver Function Tests: Recent Labs  Lab 05/05/20 0043  AST 26  ALT 15  ALKPHOS 83  BILITOT 0.8  PROT 7.5  ALBUMIN 3.9   No results for input(s): LIPASE, AMYLASE in the last 168 hours. No results for input(s): AMMONIA in the last 168 hours. Coagulation Profile: Recent Labs  Lab 05/05/20 0043  INR 1.8*   Cardiac Enzymes: No results for input(s): CKTOTAL, CKMB, CKMBINDEX, TROPONINI in the last 168 hours. BNP (last 3 results) No results for input(s): PROBNP in the last 8760 hours. HbA1C: No results for input(s): HGBA1C in the last 72 hours. CBG: No results for input(s): GLUCAP in the last 168  hours. Lipid Profile: No results for input(s): CHOL, HDL, LDLCALC, TRIG, CHOLHDL, LDLDIRECT in the last 72 hours. Thyroid Function Tests: No results for input(s): TSH, T4TOTAL, FREET4, T3FREE, THYROIDAB in the last 72 hours. Anemia Panel: No results for input(s): VITAMINB12, FOLATE, FERRITIN, TIBC, IRON, RETICCTPCT in the last 72 hours. Sepsis Labs: No results for input(s): PROCALCITON, LATICACIDVEN in the last 168 hours.  Recent Results (from the past 240 hour(s))  Resp Panel by RT-PCR (Flu A&B, Covid) Nasopharyngeal Swab     Status: None   Collection Time: 05/05/20  1:26 AM   Specimen: Nasopharyngeal Swab; Nasopharyngeal(NP) swabs in vial transport medium  Result Value Ref Range Status   SARS Coronavirus 2 by RT PCR NEGATIVE NEGATIVE Final    Comment: (NOTE) SARS-CoV-2 target nucleic acids are NOT DETECTED.  The SARS-CoV-2 RNA is generally detectable in upper respiratory specimens during the acute phase of infection. The lowest concentration of SARS-CoV-2 viral copies this assay can detect is 138 copies/mL. A negative result does not preclude SARS-Cov-2 infection and should not be used as the sole basis for treatment or other patient management decisions. A negative result may occur with  improper specimen collection/handling, submission of specimen other than nasopharyngeal swab, presence of viral mutation(s) within the areas targeted by this assay, and inadequate number of viral copies(<138 copies/mL). A negative result must be combined with clinical observations, patient history, and epidemiological information. The expected result is Negative.  Fact Sheet for Patients:  BloggerCourse.com  Fact Sheet for Healthcare Providers:  SeriousBroker.it  This test is no t yet approved or cleared by the [place] FDA and  has been authorized for detection and/or diagnosis of SARS-CoV-2 by FDA under an Emergency Use Authorization  (EUA). This EUA will remain  in effect (meaning this test can be used) for the duration of the COVID-19 declaration under Section 564(b)(1) of the Act, 21 U.S.C.section 360bbb-3(b)(1), unless the authorization is terminated  or revoked sooner.       Influenza A by PCR NEGATIVE NEGATIVE Final   Influenza B by PCR NEGATIVE NEGATIVE Final    Comment: (NOTE) The Xpert Xpress SARS-CoV-2/FLU/RSV plus assay is intended as an aid in the diagnosis of influenza from Nasopharyngeal swab specimens and should not be used as a sole basis for treatment. Nasal washings and aspirates are unacceptable for Xpert Xpress SARS-CoV-2/FLU/RSV testing.  Fact Sheet for Patients: BloggerCourse.com  Fact Sheet for Healthcare Providers: SeriousBroker.it  This test is not yet approved or cleared by the [place] FDA and has been authorized for detection and/or diagnosis of SARS-CoV-2  by FDA under an Emergency Use Authorization (EUA). This EUA will remain in effect (meaning this test can be used) for the duration of the COVID-19 declaration under Section 564(b)(1) of the Act, 21 U.S.C. section 360bbb-3(b)(1), unless the authorization is terminated or revoked.  Performed at [facility], [address], [place], [place] [address]          Radiology Studies: MR LUMBAR SPINE WO CONTRAST  Result Date: 05/06/2020 CLINICAL DATA:  Initial evaluation for acute trauma, back pain. EXAM: MRI LUMBAR SPINE WITHOUT CONTRAST TECHNIQUE: Multiplanar, multisequence MR imaging of the lumbar spine was performed. No intravenous contrast was administered. COMPARISON:  Prior radiograph from 05/05/2020 FINDINGS: Segmentation: Standard. Lowest well-formed disc space labeled the L5-S1 level. Alignment: Trace 2 mm anterolisthesis of L3 on L4, with 7 mm anterolisthesis of L4 on L5. Findings chronic and facet mediated. Alignment otherwise normal with preservation of the  normal lumbar lordosis. Vertebrae: Acute compression fracture involving the superior endplate of L1. Associated central height loss of up to 20% without bony retropulsion. Additional acute compression fracture involving the superior endplate of L2. Associated 30% height loss with trace 2 mm bony retropulsion. These fractures are benign/mechanical in appearance. Otherwise, vertebral body height maintained with no other acute or chronic fracture. Underlying bone marrow signal intensity within normal limits. No discrete or worrisome osseous lesions. Mild reactive marrow edema noted about the right L4-5 facet due to facet arthritis. No other abnormal marrow edema. Conus medullaris and cauda equina: Conus extends to the L1-2 level. Conus and cauda equina appear normal. Paraspinal and other soft tissues: Mild paraspinous edema adjacent to the L1 and L2 compression fractures. Mild edema noted within the lower posterior paraspinous musculature as well, likely reflecting a degree of associated muscular injury/strain. Few benign appearing cyst noted within the kidneys bilaterally. Visualized visceral structures otherwise unremarkable. Disc levels: T12-L1: Mild disc bulge with disc desiccation. Superimposed small right foraminal to extraforaminal disc protrusion (series 8, image 3). No significant spinal stenosis. Foramina remain patent. L1-2: Mild disc bulge with disc desiccation. Trace 2 mm bony retropulsion related to the L2 compression fracture. Mild facet hypertrophy. No significant spinal stenosis. Foramina remain patent. L2-3: Diffuse disc bulge with disc desiccation. Mild facet and ligament flavum hypertrophy. No more than mild narrowing of the lateral recesses bilaterally. Central canal remains patent. No significant foraminal stenosis. L3-4: Trace anterolisthesis. Mild disc bulge with disc desiccation. Mild facet and ligament flavum hypertrophy. No significant spinal stenosis. Mild left L3 foraminal narrowing. Right  neural foramen remains patent. L4-5: 7 mm anterolisthesis. Associated broad posterior pseudo disc uncovering, asymmetric to the left. Moderate facet and ligament flavum hypertrophy. Resultant mild to moderate left greater than right lateral recess stenosis. Central canal remains patent. Mild to moderate left greater than right L4 foraminal narrowing. L5-S1: Mild disc bulge with disc desiccation. Moderate right with mild left facet hypertrophy. No significant spinal stenosis. Mild right L5 foraminal narrowing. Left neural foramina remains patent. IMPRESSION: 1. Acute compression fracture involving the superior endplate of L1 with associated 20% height loss without bony retropulsion. 2. Acute compression fracture involving the superior endplate of L2 with associated 30% height loss with trace 2 mm bony retropulsion. No significant stenosis. 3. 7 mm anterolisthesis of L4 on L5 with associated disc bulge and facet hypertrophy, resulting in mild to moderate left greater than right lateral recess and foraminal stenosis. Electronically Signed   By: Rise Mu M.D.   On: 05/06/2020 21:20   ECHOCARDIOGRAM COMPLETE  Result Date:  05/06/2020    ECHOCARDIOGRAM REPORT   Patient Name:   PASQUALINA COLASURDO Date of Exam: 05/06/2020 Medical Rec #:  [id number]            Height:       64.0 in Accession #:    [id number]           Weight:       149.0 lb Date of Birth:  16-Jul-1924            BSA:          1.726 m Patient Age:    87 years             BP:           122/48 mmHg Patient Gender: F                    HR:           104 bpm. Exam Location:  [facility] Procedure: 2D Echo, Color Doppler and Cardiac Doppler Indications:     I48.91 Atrial fibrillation  History:         Patient has no prior history of Echocardiogram examinations.                  Sepsis.  Sonographer:     Humphrey Rolls RDCS (AE) Referring Phys:  [id number] Vernetta Honey MANSY Diagnosing Phys: Marcina Millard MD  Sonographer Comments: Suboptimal apical window and  suboptimal subcostal window. IMPRESSIONS  1. Left ventricular ejection fraction, by estimation, is 60 to 65%. The left ventricle has normal function. The left ventricle has no regional wall motion abnormalities. Left ventricular diastolic parameters are indeterminate.  2. Right ventricular systolic function is normal. The right ventricular size is normal.  3. The mitral valve is normal in structure. Mild to moderate mitral valve regurgitation. No evidence of mitral stenosis.  4. The aortic valve is normal in structure. Aortic valve regurgitation is not visualized. No aortic stenosis is present.  5. The inferior vena cava is normal in size with greater than 50% respiratory variability, suggesting right atrial pressure of 3 mmHg. FINDINGS  Left Ventricle: Left ventricular ejection fraction, by estimation, is 60 to 65%. The left ventricle has normal function. The left ventricle has no regional wall motion abnormalities. The left ventricular internal cavity size was normal in size. There is  no left ventricular hypertrophy. Left ventricular diastolic parameters are indeterminate. Right Ventricle: The right ventricular size is normal. No increase in right ventricular wall thickness. Right ventricular systolic function is normal. Left Atrium: Left atrial size was normal in size. Right Atrium: Right atrial size was normal in size. Pericardium: Trivial pericardial effusion is present. Mitral Valve: The mitral valve is normal in structure. Mild to moderate mitral valve regurgitation. No evidence of mitral valve stenosis. MV peak gradient, 9.6 mmHg. The mean mitral valve gradient is 4.0 mmHg. Tricuspid Valve: The tricuspid valve is normal in structure. Tricuspid valve regurgitation is mild . No evidence of tricuspid stenosis. Aortic Valve: The aortic valve is normal in structure. Aortic valve regurgitation is not visualized. No aortic stenosis is present. Aortic valve mean gradient measures 3.0 mmHg. Aortic valve peak  gradient measures 6.2 mmHg. Aortic valve area, by VTI measures 2.06 cm. Pulmonic Valve: The pulmonic valve was normal in structure. Pulmonic valve regurgitation is not visualized. No evidence of pulmonic stenosis. Aorta: The aortic root is normal in size and structure. Venous: The inferior vena cava is normal in size with greater than  50% respiratory variability, suggesting right atrial pressure of 3 mmHg. IAS/Shunts: No atrial level shunt detected by color flow Doppler.  LEFT VENTRICLE PLAX 2D LVIDd:         4.50 cm  Diastology LVIDs:         2.90 cm  LV e' medial:    8.05 cm/s LV PW:         1.10 cm  LV E/e' medial:  16.5 LV IVS:        0.80 cm  LV e' lateral:   11.30 cm/s LVOT diam:     1.80 cm  LV E/e' lateral: 11.7 LV SV:         45 LV SV Index:   26 LVOT Area:     2.54 cm  RIGHT VENTRICLE RV Basal diam:  3.40 cm LEFT ATRIUM           Index LA diam:      4.20 cm 2.43 cm/m LA Vol (A2C): 38.2 ml 22.13 ml/m LA Vol (A4C): 91.8 ml 53.18 ml/m  AORTIC VALVE                   PULMONIC VALVE AV Area (Vmax):    2.04 cm    PV Vmax:       0.83 m/s AV Area (Vmean):   2.13 cm    PV Vmean:      55.900 cm/s AV Area (VTI):     2.06 cm    PV VTI:        0.152 m AV Vmax:           125.00 cm/s PV Peak grad:  2.8 mmHg AV Vmean:          87.400 cm/s PV Mean grad:  1.0 mmHg AV VTI:            0.217 m AV Peak Grad:      6.2 mmHg AV Mean Grad:      3.0 mmHg LVOT Vmax:         100.00 cm/s LVOT Vmean:        73.300 cm/s LVOT VTI:          0.176 m LVOT/AV VTI ratio: 0.81  AORTA Ao Root diam: 2.70 cm MITRAL VALVE                TRICUSPID VALVE MV Area (PHT): 3.97 cm     TR Peak grad:   26.4 mmHg MV Area VTI:   1.77 cm     TR Vmax:        257.00 cm/s MV Peak grad:  9.6 mmHg MV Mean grad:  4.0 mmHg     SHUNTS MV Vmax:       1.55 m/s     Systemic VTI:  0.18 m MV Vmean:      85.4 cm/s    Systemic Diam: 1.80 cm MV Decel Time: 191 msec MV E velocity: 132.50 cm/s Marcina MillardAlexander Paraschos MD Electronically signed by Marcina MillardAlexander Paraschos MD  Signature Date/Time: 05/06/2020/12:09:45 PM    Final         Scheduled Meds: . dabigatran  150 mg Oral Q12H  . diltiazem  120 mg Oral Daily  . furosemide  20 mg Oral Daily  . lidocaine  1 patch Transdermal Q24H  . metoprolol succinate  25 mg Oral Daily  . multivitamin with minerals  1 tablet Oral Daily   Continuous Infusions:  Assessment & Plan:   Active Problems:   Atrial fibrillation  with rapid ventricular response (HCC)   1.  Atrial fibrillation with rapid ventricular response. Was on cardizem gtt, switched to cardizem CD 120mg  qd 4/15 continue Pradaxa  Echo normal EF  Cardiology was following  Toprol was decreased to 25 mg daily from twice daily by cardiology however patient heart rate is elevated and cannot ambulate with PT.  Will go back to her original dose of 50 but will divide to Toprol-XL 25 mg twice daily  Follow-up cardiology as outpatient  2.Acute on chronic DHF- more euvolemic on exam Clinically improved HR and bp control Echo nml EF 4/15 euvolemic on exam  Continue low-dose Lasix  I's and O's  Daily weight      3.  L2 compression fracture possibly pathological secondary to osteoporosis. Continue with pain management  Neurosurgery consulted, input was appreciated, will obtain L spine MRI Consider kyphoplasty if she cannot ambulate with LSBo 4/15 LSO  PT recommends SNF     4.  Acute respiratory failure with hypoxia, likely secondary to acute CHF. Improved  Keep O2 sat above 92%  I-S  Continue Lasix     5.  Hypertensive urgency, likely contributing to her acute CHF. improved Continue to monitor   6.  Recent UTI status post treatment with Cipro. -Urinalysis currently negative.  She finished taking Cipro.    DVT prophylaxis: Pradaxa Code Status: Full Family Communication: Daughter at bedside  Status is: Inpatient  Remains inpatient appropriate because:Inpatient level of care appropriate due to severity of illness   Dispo:  The patient is from: Home              Anticipated d/c is to: TBD , likely SNF              Patient currently is not medically stable to d/c.   Difficult to place patient No            LOS: 3 days   Time spent: 35 minutes with more than 50% on COC    5/15, MD [facility] Pager [phone]  If 7PM-7AM, please contact night-coverage 05/08/2020, 8:48 AM

## 2020-05-08 NOTE — Evaluation (Signed)
Physical Therapy Evaluation Patient Details Name: Tracy Singh MRN: [id number] DOB: 1924/12/29 Today's Date: 05/08/2020   History of Present Illness  Pt is a 87 y/o F with PMH that includes Afib and Von Willebrand disease. Pt presented with chief c/o low back pain (stated started 6WA and was started on muscle relaxers by Martha Clan). Pt with increased pain starting on 4/11 and presented to ED. L-spine XR from 4/12 revealed L2 compression fracture of unknown duration. Pt recommended to wear LSO with ambulation and is currently being managed conservatively, but neuro sx consulted for potential kyphoplasty if pt fails conservative management.    Clinical Impression  Pt was pleasant and motivated to participate during the session.  Pt reported no pain at rest but had significant back pain during bed mobility/log roll training.  Pt required mod A for BLE and trunk control along with heavy verbal and tactile cues for sequencing.  Once in sitting the pt's HR elevated to the low 130s as a high and then remained mostly in the 120's.  Pt's attending Dr. Marylu Lund was present and requested to defer transfer/gait assessment and training until later this date once HR is more controlled.  Pt will benefit from PT services in a SNF setting upon discharge to safely address deficits listed in patient problem list for decreased caregiver assistance and eventual return to PLOF.      Follow Up Recommendations SNF;Supervision/Assistance - 24 hour    Equipment Recommendations  Other (comment) (TBD at next venue of care)    Recommendations for Other Services       Precautions / Restrictions Precautions Precautions: Back;Fall Required Braces or Orthoses: Spinal Brace Spinal Brace: Thoracolumbosacral orthotic Restrictions Other Position/Activity Restrictions: Per OT eval pt requires lumbar brace for ambulation but able to sit EOB w/o.      Mobility  Bed Mobility Overal bed mobility: Needs Assistance Bed  Mobility: Rolling;Sidelying to Sit;Sit to Sidelying Rolling: Mod assist Sidelying to sit: Mod assist     Sit to sidelying: Mod assist General bed mobility comments: Log roll training with  mod A for BLE and trunk control    Transfers                 General transfer comment: Deferred secondary to elevated HR to the 130s in sitting, MD in room and aware  Ambulation/Gait                Stairs            Wheelchair Mobility    Modified Rankin (Stroke Patients Only)       Balance Overall balance assessment: Needs assistance Sitting-balance support: No upper extremity supported Sitting balance-Leahy Scale: Good         Standing balance comment: NT                             Pertinent Vitals/Pain Pain Assessment: 0-10 Pain Score: 8  Pain Location: lower back with movement, 0/10 at rest Pain Descriptors / Indicators: Sore;Aching Pain Intervention(s): Premedicated before session;Monitored during session    Home Living Family/patient expects to be discharged to:: Private residence Living Arrangements: Children Available Help at Discharge: Family;Available 24 hours/day Type of Home: House Home Access: Stairs to enter Entrance Stairs-Rails: Right Entrance Stairs-Number of Steps: 12 with a chair lift Home Layout: One level Home Equipment: Walker - 2 wheels;Grab bars - tub/shower;Cane - single point      Prior Function Level of  Independence: Needs assistance   Gait / Transfers Assistance Needed: Ind amb short HH distances, but increased difficulty/decreased tolerance since back pain started ~6WA  ADL's / Homemaking Assistance Needed: Dtr assists with IADLs including getting groceries, pt able to manage basic ADLs.  Comments: Pt and daughter were working out together 3-4 times a week with pt participating in water aerobics and strength training on machines     Hand Dominance        Extremity/Trunk Assessment   Upper Extremity  Assessment Upper Extremity Assessment: Generalized weakness    Lower Extremity Assessment Lower Extremity Assessment: Generalized weakness       Communication   Communication: HOH  Cognition Arousal/Alertness: Awake/alert Behavior During Therapy: WFL for tasks assessed/performed Overall Cognitive Status: Within Functional Limits for tasks assessed                                        General Comments      Exercises Total Joint Exercises Ankle Circles/Pumps: AROM;Strengthening;Both;10 reps Quad Sets: 10 reps;Both;Strengthening Gluteal Sets: Strengthening;Both;10 reps Heel Slides: AROM;AAROM;Both;5 reps;Strengthening Long Arc Quad: AROM;Strengthening;5 reps;10 reps Other Exercises Other Exercises: HEP education with pt and dtr for BLE APs, QS, and GS x 10 each every 1-2 hours daily Other Exercises: Log roll training with rolling and sidelying to/from sit   Assessment/Plan    PT Assessment Patient needs continued PT services  PT Problem List Decreased strength;Decreased activity tolerance;Decreased balance;Decreased mobility;Decreased knowledge of use of DME;Decreased knowledge of precautions;Pain       PT Treatment Interventions DME instruction;Gait training;Functional mobility training;Therapeutic activities;Therapeutic exercise;Balance training;Patient/family education    PT Goals (Current goals can be found in the Care Plan section)  Acute Rehab PT Goals Patient Stated Goal: To be able to do water aerobics again PT Goal Formulation: With patient Time For Goal Achievement: 05/21/20 Potential to Achieve Goals: Fair    Frequency 7X/week   Barriers to discharge Inaccessible home environment;Decreased caregiver support      Co-evaluation               AM-PAC PT "6 Clicks" Mobility  Outcome Measure   Help needed moving from lying on your back to sitting on the side of a flat bed without using bedrails?: A Lot Help needed moving to and from  a bed to a chair (including a wheelchair)?: A Lot Help needed standing up from a chair using your arms (e.g., wheelchair or bedside chair)?: A Lot Help needed to walk in hospital room?: A Lot Help needed climbing 3-5 steps with a railing? : Total 6 Click Score: 9    End of Session   Activity Tolerance: Treatment limited secondary to medical complications (Comment) (Elevated HR to 130s with minimal activity) Patient left: in bed;with call bell/phone within reach;with bed alarm set;with family/visitor present Nurse Communication: Mobility status;Other (comment) (MD in room and aware of pt's elevated HR with min activity) PT Visit Diagnosis: Difficulty in walking, not elsewhere classified (R26.2);Muscle weakness (generalized) (M62.81);Pain Pain - part of body:  (low back)    Time: 2376-2831 PT Time Calculation (min) (ACUTE ONLY): 28 min   Charges:   PT Evaluation $PT Eval Moderate Complexity: 1 Mod PT Treatments $Therapeutic Exercise: 8-22 mins       D. Elly Modena PT, DPT 05/08/20, 1:53 PM

## 2020-05-08 NOTE — Progress Notes (Signed)
Physical Therapy Treatment Patient Details Name: Tracy Singh MRN: [id number] DOB: 1924/12/26 Today's Date: 05/08/2020    History of Present Illness Pt is a 87 y/o F with PMH that includes Afib and Von Willebrand disease. Pt presented with chief c/o low back pain (stated started 6WA and was started on muscle relaxers by Martha Clan). Pt with increased pain starting on 4/11 and presented to ED. L-spine XR from 4/12 revealed L2 compression fracture of unknown duration. Pt recommended to wear LSO with ambulation and is currently being managed conservatively, but neuro sx consulted for potential kyphoplasty if pt fails conservative management.    PT Comments    Pt was pleasant and motivated to participate during the session.  Pt seen a second time this date per MD request secondary to pt's elevated HR limiting participation during the AM session.  Pt was able to come to standing and take several steps this session with min A for stability and cues for proper sequencing with the RW.  Pt's HR was WNL this session but her SpO2 dropped from the low to mid 90s down to the mid 80s after walking.  Pt was placed on 3LO2/min with nursing notified.  Pt's SpO2 returned to the mid to upper 90s at the end of the session.  Pt remains at an elevated risk for falls and further functional decline and would not be safe to return to her prior living situation at this time.  Pt will benefit from PT services in a SNF setting upon discharge to safely address deficits listed in patient problem list for decreased caregiver assistance and eventual return to PLOF.     Follow Up Recommendations  SNF;Supervision/Assistance - 24 hour     Equipment Recommendations  Other (comment) (TBD at next venue of care)    Recommendations for Other Services       Precautions / Restrictions Precautions Precautions: Back;Fall Required Braces or Orthoses: Spinal Brace Spinal Brace: Thoracolumbosacral orthotic Restrictions Other  Position/Activity Restrictions: Per OT eval pt requires lumbar brace for ambulation but able to sit EOB w/o.    Mobility  Bed Mobility Overal bed mobility: Needs Assistance Bed Mobility: Rolling;Sidelying to Sit;Sit to Sidelying Rolling: Mod assist Sidelying to sit: Mod assist     Sit to sidelying: Mod assist General bed mobility comments: Log roll training with mod A for BLE and trunk control    Transfers Overall transfer level: Needs assistance Equipment used: Rolling walker (2 wheeled) Transfers: Sit to/from Stand Sit to Stand: Min assist         General transfer comment: Min A for stability with min-mod verbal cues for proper sequencing  Ambulation/Gait Ambulation/Gait assistance: Min assist Gait Distance (Feet): 4 Feet Assistive device: Rolling walker (2 wheeled) Gait Pattern/deviations: Step-through pattern;Decreased step length - right;Decreased step length - left Gait velocity: decreased   General Gait Details: Pt able to take small, reciprocal steps with a RW and occasional min A for stability near the EOB and then from bed to chair; of note, pt's SpO2 dropped to the mid 80s upon returning to sitting with pt placed on 3LO2/min and nursing notified.  Pt's SpO2 quickly increased back to the 90s with pt's HR WNL throughout the session.   Stairs             Wheelchair Mobility    Modified Rankin (Stroke Patients Only)       Balance Overall balance assessment: Needs assistance Sitting-balance support: No upper extremity supported Sitting balance-Leahy Scale: Fair  Standing balance support: Bilateral upper extremity supported;During functional activity Standing balance-Leahy Scale: Poor Standing balance comment: Occasional min A for stability during ambulation                            Cognition Arousal/Alertness: Awake/alert Behavior During Therapy: WFL for tasks assessed/performed Overall Cognitive Status: Within Functional Limits  for tasks assessed                                        Exercises Total Joint Exercises Ankle Circles/Pumps: AROM;Strengthening;Both;10 reps Quad Sets: 10 reps;Both;Strengthening Gluteal Sets: Strengthening;Both;10 reps Heel Slides: AROM;AAROM;Both;5 reps;Strengthening Long Arc Quad: AROM;Strengthening;5 reps;10 reps Other Exercises Other Exercises: Log roll training/review with rolling and sidelying to sit Other Exercises: Log roll training with rolling and sidelying to/from sit    General Comments        Pertinent Vitals/Pain Pain Assessment: No/denies pain Pain Score: 8  Faces Pain Scale: Hurts even more Pain Location: 0/10 at rest but 6/10 with movement with some moaning/grimacing Pain Descriptors / Indicators: Grimacing;Moaning Pain Intervention(s): Premedicated before session;Monitored during session    Home Living Family/patient expects to be discharged to:: Private residence Living Arrangements: Children Available Help at Discharge: Family;Available 24 hours/day Type of Home: House Home Access: Stairs to enter Entrance Stairs-Rails: Right Home Layout: One level Home Equipment: Walker - 2 wheels;Grab bars - tub/shower;Cane - single point      Prior Function Level of Independence: Needs assistance  Gait / Transfers Assistance Needed: Ind amb short HH distances, but increased difficulty/decreased tolerance since back pain started ~6WA ADL's / Homemaking Assistance Needed: Dtr assists with IADLs including getting groceries, pt able to manage basic ADLs. Comments: Pt and daughter were working out together 3-4 times a week with pt participating in water aerobics and strength training on machines   PT Goals (current goals can now be found in the care plan section) Acute Rehab PT Goals Patient Stated Goal: To be able to do water aerobics again PT Goal Formulation: With patient Time For Goal Achievement: 05/21/20 Potential to Achieve Goals:  Fair Progress towards PT goals: Progressing toward goals    Frequency    7X/week      PT Plan Current plan remains appropriate    Co-evaluation              AM-PAC PT "6 Clicks" Mobility   Outcome Measure  Help needed turning from your back to your side while in a flat bed without using bedrails?: A Little Help needed moving from lying on your back to sitting on the side of a flat bed without using bedrails?: A Lot Help needed moving to and from a bed to a chair (including a wheelchair)?: A Lot Help needed standing up from a chair using your arms (e.g., wheelchair or bedside chair)?: A Little Help needed to walk in hospital room?: A Lot Help needed climbing 3-5 steps with a railing? : Total 6 Click Score: 13    End of Session Equipment Utilized During Treatment: Gait belt Activity Tolerance: Other (comment) (SpO2 dropped to the mid 80s on room air with ambulation) Patient left: in chair;with nursing/sitter in room;with chair alarm set;with call bell/phone within reach;with family/visitor present Nurse Communication: Mobility status;Other (comment) (SpO2 desaturation per above) PT Visit Diagnosis: Difficulty in walking, not elsewhere classified (R26.2);Muscle weakness (generalized) (M62.81);Pain Pain - part of  body:  (low back)     Time: 5409-8119 PT Time Calculation (min) (ACUTE ONLY): 16 min  Charges:  $Therapeutic Exercise: 8-22 mins $Therapeutic Activity: 8-22 mins                     D. Scott Sibel Khurana PT, DPT 05/08/20, 5:08 PM

## 2020-05-09 LAB — BASIC METABOLIC PANEL
Anion gap: 11 (ref 5–15)
BUN: 15 mg/dL (ref 8–23)
CO2: 31 mmol/L (ref 22–32)
Calcium: 8.9 mg/dL (ref 8.9–10.3)
Chloride: 90 mmol/L — ABNORMAL LOW (ref 98–111)
Creatinine, Ser: 0.8 mg/dL (ref 0.44–1.00)
GFR, Estimated: >60 mL/min (ref >60)
Glucose, Bld: 116 mg/dL — ABNORMAL HIGH (ref 70–99)
Potassium: 3.7 mmol/L (ref 3.5–5.1)
Sodium: 132 mmol/L — ABNORMAL LOW (ref 135–145)

## 2020-05-09 LAB — BRAIN NATRIURETIC PEPTIDE: B Natriuretic Peptide: 178.2 pg/mL — ABNORMAL HIGH (ref 0.0–100.0)

## 2020-05-09 MED ORDER — METOPROLOL SUCCINATE ER 25 MG PO TB24
37.5000 mg | ORAL_TABLET | Freq: Two times a day (BID) | ORAL | Status: DC
  Administered 2020-05-09: 37.5 mg via ORAL
  Filled 2020-05-09: qty 2

## 2020-05-09 NOTE — Progress Notes (Addendum)
Physical Therapy Treatment Patient Details Name: Tracy Singh MRN: [id number] DOB: July 28, 1924 Today's Date: 05/09/2020    History of Present Illness Pt is a 87 y/o F with PMH that includes Afib and Von Willebrand disease. Pt presented with chief c/o low back pain (stated started 6WA and was started on muscle relaxers by Martha Clan). Pt with increased pain starting on 4/11 and presented to ED. L-spine XR from 4/12 revealed L2 compression fracture of unknown duration. Pt recommended to wear LSO with ambulation and is currently being managed conservatively, but neuro sx consulted for potential kyphoplasty if pt fails conservative management.    PT Comments    Pt was asleep upon entering with supportive daughter at bedside. She easily awakes and agrees to OOB activity with minimal encouragement. Endorses 7/10 pain during all mobility, however does state it is better today than previous day. She performed log roll R to short sit with increased time and step by step vcs for technique. Sat EOB for several minutes prior to OOB activity. Pt has had A fib since 2018 and HR fluctuates throughout session with one short occasion of HR reading 158 bpm. Pt was on rm air throughout session with sao2 >91%. Poor pleth with sao2 reading on finger. Moved sensor to ear lobe for more accurate reading. Applied clamshell LSO while seated EOB. Pt stood and ambulated to doorway of room and return with CGA mostly however during turning did require min assist to prevent LOB. Overall tolerated session well but will need SNF prior to returning to private apartment. Both pt/pt's daughter agreeable to plan. She =will continue to benefit from skilled PT at DC to address deficits while maximizing independence with ADLs.      Follow Up Recommendations  SNF;Supervision/Assistance - 24 hour     Equipment Recommendations  Other (comment) (defer to next level of care)       Precautions / Restrictions Precautions Precautions:  Back;Fall Required Braces or Orthoses: Spinal Brace Spinal Brace: Thoracolumbosacral orthotic (Pre fab clamshell TLSO) Restrictions Weight Bearing Restrictions: No Other Position/Activity Restrictions: Per OT eval pt requires lumbar brace for ambulation but able to sit EOB w/o.    Mobility  Bed Mobility Overal bed mobility: Needs Assistance Bed Mobility: Rolling;Sidelying to Sit;Sit to Sidelying Rolling: Min assist Sidelying to sit: Mod assist       General bed mobility comments: Pt was able to roll R to short sit. increased time required withy vcs for step by step sequencing. Clamshell TLSO applied in sitting at EOB    Transfers Overall transfer level: Needs assistance Equipment used: Rolling walker (2 wheeled) Transfers: Sit to/from Stand Sit to Stand: Min assist         General transfer comment: Min assist to stand from only minimally elevated bed height. Slow eccentric stand to sit due to pain  Ambulation/Gait Ambulation/Gait assistance: Min guard;Min assist Gait Distance (Feet): 30 Feet Assistive device: Rolling walker (2 wheeled) Gait Pattern/deviations: Step-through pattern;Decreased step length - right;Decreased step length - left Gait velocity: decreased   General Gait Details: Pt was able to ambulate to/fro doorway of room with CGA. Did have one occasion during turning that required min assist to prevent fall. Pain continues to limit session progression but overall per pt/pt's daughter...Marland Kitchen"Much improved from previous date."       Balance Overall balance assessment: Needs assistance Sitting-balance support: No upper extremity supported Sitting balance-Leahy Scale: Fair     Standing balance support: Bilateral upper extremity supported;During functional activity Standing balance-Leahy Scale: Poor  Standing balance comment: reliant on RW. min assist while turning for safety      Cognition Arousal/Alertness: Awake/alert Behavior During Therapy: WFL for tasks  assessed/performed Overall Cognitive Status: Within Functional Limits for tasks assessed      General Comments: pt is HOH so some delayed responses, but overall appropriate conversationally and appropriate with following commands. Supportive daughtr present throughout session. Pt has A-fib since 2018. O2 sensor moved form finger to ear for improved pleth/reading             Pertinent Vitals/Pain Pain Assessment: 0-10 Pain Score: 7  Faces Pain Scale: Hurts little more Pain Location: LBP Pain Descriptors / Indicators: Discomfort;Grimacing Pain Intervention(s): Limited activity within patient's tolerance;Monitored during session;Premedicated before session;Repositioned           PT Goals (current goals can now be found in the care plan section) Acute Rehab PT Goals Patient Stated Goal: return home once able Progress towards PT goals: Progressing toward goals    Frequency    7X/week      PT Plan Current plan remains appropriate       AM-PAC PT "6 Clicks" Mobility   Outcome Measure  Help needed turning from your back to your side while in a flat bed without using bedrails?: A Little Help needed moving from lying on your back to sitting on the side of a flat bed without using bedrails?: A Lot Help needed moving to and from a bed to a chair (including a wheelchair)?: A Lot Help needed standing up from a chair using your arms (e.g., wheelchair or bedside chair)?: A Little Help needed to walk in hospital room?: A Little Help needed climbing 3-5 steps with a railing? : A Lot 6 Click Score: 15    End of Session Equipment Utilized During Treatment: Back brace Activity Tolerance: Patient tolerated treatment well;Patient limited by pain Patient left: in chair;with nursing/sitter in room;with chair alarm set;with call bell/phone within reach;with family/visitor present Nurse Communication: Mobility status PT Visit Diagnosis: Difficulty in walking, not elsewhere classified  (R26.2);Muscle weakness (generalized) (M62.81);Pain     Time: 7494-4967 PT Time Calculation (min) (ACUTE ONLY): 24 min  Charges:  $Gait Training: 8-22 mins $Therapeutic Activity: 8-22 mins                     Jetta Lout PTA 05/09/20, 4:19 PM

## 2020-05-09 NOTE — Progress Notes (Signed)
Pt alert and oriented x 4. Pt dry heaving at shift change. Night nurse Toniann Fail administered ordered PRN zofran during bedside report. Report given to Toniann Fail, [place]

## 2020-05-09 NOTE — Progress Notes (Signed)
Chief Complaint:  back pain  05/09/2020 Patient has made progress.  Sat in chair for 2 hours yesterday.  She had some vital sign instability with PT yesterday, so had limited mobilization.   History of Present Illness: 05/06/2020 Tracy Singh is a 87 y.o. female who presents with the chief complaint of back pain as well as atrial fibrillation with RVR.  She had initial back pain in March around the 11th, and saw Dr. Martha Singh. He started muscle relaxants with some improvement.  She started back at her prior activities, but then had a major setback on April 11th.  Due to incapacitating pain, she was brought to ER for evaluation.  In ER, she had Afib with RVR, and has been admitted to medicine for management.  Her back showed evidence of a compression fracture.  She has no new radicular symptoms.   Review of Systems:  A 10 point review of systems is negative, except for the pertinent positives and negatives detailed in the HPI.  Past Medical History:     Past Medical History:  Diagnosis Date  . A-fib (HCC)   . Sepsis (HCC) 03/21/2018  . Von Willebrand disease (HCC)     Past Surgical History:      Past Surgical History:  Procedure Laterality Date  . ABDOMINAL HYSTERECTOMY      Allergies:      Allergies as of 05/05/2020 - Review Complete 05/05/2020  Allergen Reaction Noted  . Red dye Swelling 05/15/2018  . Yellow dyes (non-tartrazine) Swelling 05/15/2018    Medications:  Current Meds  Medication Sig  . ciprofloxacin (CIPRO) 500 MG tablet Take 500 mg by mouth 2 (two) times daily.  . hydrochlorothiazide (HYDRODIURIL) 25 MG tablet Take 1 tablet by mouth daily.  Marland Kitchen lisinopril (PRINIVIL,ZESTRIL) 5 MG tablet Take 5 mg by mouth daily.  Marland Kitchen loperamide (IMODIUM) 2 MG capsule Take by mouth as needed for diarrhea or loose stools.  . metoprolol succinate (TOPROL-XL) 50 MG 24 hr tablet Take 50 mg by mouth daily.  . Multiple Vitamin (MULTIVITAMIN) capsule Take  1 capsule by mouth daily.  Marland Kitchen PRADAXA 150 MG CAPS capsule Take 150 mg by mouth. bid     Social History: Social History        Tobacco Use  . Smoking status: Former Smoker    Packs/day: 0.05    Years: 6.00    Pack years: 0.30    Types: Cigarettes    Quit date: 91    Years since quitting: 65.3  . Smokeless tobacco: Never Used  . Tobacco comment: 1 pack every 2 weeks  Vaping Use  . Vaping Use: Never used  Substance Use Topics  . Alcohol use: Never  . Drug use: Never    Family Medical History:      Family History  Problem Relation Age of Onset  . Bladder Cancer Father   . Cancer Brother     Physical Examination: Vitals:   05/09/20 0537 05/09/20 0824  BP: 115/75 135/69  Pulse: 80 82  Resp: 20 16  Temp: 97.8 F (36.6 C) 98.1 F (36.7 C)  SpO2: 97% 95%     General:          Patient is well developed, well nourished, calm, collected, and in no apparent distress.  Psychiatric:      Patient is non-anxious.  Head:               Pupils equal, round, and reactive to light.  ENT:  Oral mucosa appears well hydrated. Hard of hearing.  Neck:               Supple.  Full range of motion.  Respiratory:     Patient is breathing without any difficulty.  Extremities:     No edema.  Vascular:         Palpable pulses in dorsal pedal vessels.  Skin:                On exposed skin, there are no abnormal skin lesions.  NEUROLOGICAL:  General: In no acute distress.   Awake, alert, oriented to person, place, and time.  Pupils equal round and reactive to light.  Facial tone is symmetric.  Tongue protrusion is midline.  There is no pronator drift.  ROM of spine: untested.  Strength: Side Biceps Triceps Deltoid Interossei Grip Wrist Ext. Wrist Flex.  R 5 5 5 5 5 5 5   L 5 5 5 5 5 5 5    Side Iliopsoas Quads Hamstring PF DF EHL  R 5 5 5 5 5 5   L 5 5 5 5 5 5     Bilateral upper and lower extremity sensation is intact to light  touch. Reflexes are 2+ and symmetric at the biceps, triceps, brachioradialis, patella and achilles. Hoffman's is absent.  Clonus is not present.   Gait is untested.  Imaging: L spine xrays 05/05/2020 IMPRESSION: Age-indeterminate L2 superior endplate fracture with approximately 30-40% loss of height.  Moderate degenerative disc disease L4-5 with cystic grade 1 anterolisthesis.  Cholelithiasis   Electronically Signed By: MD On: 05/05/2020 02:31  MRI L spine 05/06/20 IMPRESSION: 1. Acute compression fracture involving the superior endplate of L1 with associated 20% height loss without bony retropulsion. 2. Acute compression fracture involving the superior endplate of L2 with associated 30% height loss with trace 2 mm bony retropulsion. No significant stenosis. 3. 7 mm anterolisthesis of L4 on L5 with associated disc bulge and facet hypertrophy, resulting in mild to moderate left greater than right lateral recess and foraminal stenosis.   Electronically Signed   By: 07/05/2020 M.D.   On: 05/06/2020 21:20  I have personally reviewed the images and agree with the above interpretation.  Labs: CBC Latest Ref Rng & Units 05/05/2020 05/05/2020 04/01/2018  WBC 4.0 - 10.5 K/uL 9.0 7.0 8.7  Hemoglobin 12.0 - 15.0 g/dL 05/08/2020 07/05/2020 07/05/2020  Hematocrit 36.0 - 46.0 % 40.3 41.1 40.0  Platelets 150 - 400 K/uL 266 279 441(H)       Assessment and Plan: Tracy Singh is a pleasant 87 y.o. female with L1 and L2 compression fractures.  - Continue LSO when OOB - consider kyphoplasty if she cannot ambulate with LSO - Placement per primary team - I will arrange follow up.  Please contact NSGY on call if additional questions arise. I will sign off for now.    Chidera Dearcos K. 56.8 MD, MPHS Dept. of Neurosurgery

## 2020-05-09 NOTE — Progress Notes (Signed)
PROGRESS NOTE    LAKITA SAHLIN  LAG:[id number] DOB: [birth year] DOA: 05/05/2020 PCP: Reubin Milan, MD    Brief Narrative:  KHALILA BUECHNER is a 87 y.o. Caucasian female with medical history significant for atrial fibrillation on Pradaxa and von Willebrand disease, who presented to the emergency room with acute onset of low back pain for which she was recently seen by Dr. Martha Clan and prescribed Robaxin with mild help.  She has been having urinary frequency and urgency and felt to have a UTI.  She was seen by her cardiologist and given p.o. Cipro. She does have bilateral lower extremity edema.  Her pulse extremities dropped to 85% on room air in the ER and she was placed on 2 L of O2 by nasal cannula.ED Course: Upon presentation to the emergency room blood pressure was 186/152 with a heart rate of 137 and she was in rapid atrial fibrillation.  Labs revealed mild hyponatremia.BNP was 724.5 and high-sensitivity troponin I was 11 and later 9.  Influenza antigens and COVID-19 PCR came back negative   EKG was atrial fibrillation with RVR.  Lumbar spine x-ray showed L2 superior endplate fracture with approximately 30 to 40% loss of height and moderate degenerative disc disease at L4-L5 with cystic grade 1 anterolisthesis as well as cholelithiasis.  T-spine x-ray showed no acute abnormalities.   4/14- awaiting brace. 4/15-PT rec. SNF. Brace here. Pt with no new complaints. No overnight issues.  On telemetry with physical therapy heart rate increased to the 120s.  Given extra dose of Toprol-XL 25. 4/16-heart rate from 102 to 110 at rest in bed.  Patient denies any dizziness, chest pain or shortness of breath  Consultants:   Cardiology, neurosurgery  Procedures:   Antimicrobials:       Subjective: As above.  Objective: Vitals:   05/08/20 1640 05/08/20 2120 05/09/20 0537 05/09/20 0824  BP: (!) 154/78 117/77 115/75 135/69  Pulse: 82 97 80 82  Resp: (!) 22 20 20 16   Temp: (!)  97.5 F (36.4 C) 97.7 F (36.5 C) 97.8 F (36.6 C) 98.1 F (36.7 C)  TempSrc: Oral Oral Oral Oral  SpO2: 95% 99% 97% 95%  Weight:      Height:        Intake/Output Summary (Last 24 hours) at 05/09/2020 0850 Last data filed at 05/08/2020 1830 Gross per 24 hour  Intake 960 ml  Output 500 ml  Net 460 ml   Filed Weights   05/05/20 0039 05/07/20 0517  Weight: 67.6 kg 61.7 kg    Examination: NAD, calm CTA no wheeze rales rhonchi's  irregular S1-S2 no gallops Soft benign positive bowel sounds No edema Awake and alert, grossly intact Mood and affect appropriate in current setting    Data Reviewed: I have personally reviewed following labs and imaging studies  CBC: Recent Labs  Lab 05/05/20 0043 05/05/20 0517 05/08/20 0720  WBC 7.0 9.0 8.4  NEUTROABS 4.5  --   --   HGB 14.1 13.7 15.8*  HCT 41.1 40.3 45.6  MCV 91.5 92.6 90.1  PLT 279 266 288   Basic Metabolic Panel: Recent Labs  Lab 05/05/20 0045 05/05/20 0517 05/06/20 0556 05/07/20 0613 05/08/20 0720 05/09/20 0524  NA  --  133* 132* 132* 128* 132*  K  --  3.9 3.8 3.5 3.9 3.7  CL  --  99 91* 91* 86* 90*  CO2  --  25 30 32 30 31  GLUCOSE  --  151* 110* 113* 130* 116*  BUN  --  13 14 15 15 15   CREATININE  --  0.60 0.85 0.78 0.82 0.80  CALCIUM  --  8.8* 9.3 8.8* 9.0 8.9  MG 2.0  --   --   --   --   --    GFR: Estimated Creatinine Clearance: 37.9 mL/min (by C-G formula based on SCr of 0.8 mg/dL). Liver Function Tests: Recent Labs  Lab 05/05/20 0043  AST 26  ALT 15  ALKPHOS 83  BILITOT 0.8  PROT 7.5  ALBUMIN 3.9   No results for input(s): LIPASE, AMYLASE in the last 168 hours. No results for input(s): AMMONIA in the last 168 hours. Coagulation Profile: Recent Labs  Lab 05/05/20 0043  INR 1.8*   Cardiac Enzymes: No results for input(s): CKTOTAL, CKMB, CKMBINDEX, TROPONINI in the last 168 hours. BNP (last 3 results) No results for input(s): PROBNP in the last 8760 hours. HbA1C: No results for  input(s): HGBA1C in the last 72 hours. CBG: No results for input(s): GLUCAP in the last 168 hours. Lipid Profile: No results for input(s): CHOL, HDL, LDLCALC, TRIG, CHOLHDL, LDLDIRECT in the last 72 hours. Thyroid Function Tests: No results for input(s): TSH, T4TOTAL, FREET4, T3FREE, THYROIDAB in the last 72 hours. Anemia Panel: No results for input(s): VITAMINB12, FOLATE, FERRITIN, TIBC, IRON, RETICCTPCT in the last 72 hours. Sepsis Labs: No results for input(s): PROCALCITON, LATICACIDVEN in the last 168 hours.  Recent Results (from the past 240 hour(s))  Resp Panel by RT-PCR (Flu A&B, Covid) Nasopharyngeal Swab     Status: None   Collection Time: 05/05/20  1:26 AM   Specimen: Nasopharyngeal Swab; Nasopharyngeal(NP) swabs in vial transport medium  Result Value Ref Range Status   SARS Coronavirus 2 by RT PCR NEGATIVE NEGATIVE Final    Comment: (NOTE) SARS-CoV-2 target nucleic acids are NOT DETECTED.  The SARS-CoV-2 RNA is generally detectable in upper respiratory specimens during the acute phase of infection. The lowest concentration of SARS-CoV-2 viral copies this assay can detect is 138 copies/mL. A negative result does not preclude SARS-Cov-2 infection and should not be used as the sole basis for treatment or other patient management decisions. A negative result may occur with  improper specimen collection/handling, submission of specimen other than nasopharyngeal swab, presence of viral mutation(s) within the areas targeted by this assay, and inadequate number of viral copies(<138 copies/mL). A negative result must be combined with clinical observations, patient history, and epidemiological information. The expected result is Negative.  Fact Sheet for Patients:  07/05/20  Fact Sheet for Healthcare Providers:  BloggerCourse.com  This test is no t yet approved or cleared by the [employer] FDA and  has been  authorized for detection and/or diagnosis of SARS-CoV-2 by FDA under an Emergency Use Authorization (EUA). This EUA will remain  in effect (meaning this test can be used) for the duration of the COVID-19 declaration under Section 564(b)(1) of the Act, 21 U.S.C.section 360bbb-3(b)(1), unless the authorization is terminated  or revoked sooner.       Influenza A by PCR NEGATIVE NEGATIVE Final   Influenza B by PCR NEGATIVE NEGATIVE Final    Comment: (NOTE) The Xpert Xpress SARS-CoV-2/FLU/RSV plus assay is intended as an aid in the diagnosis of influenza from Nasopharyngeal swab specimens and should not be used as a sole basis for treatment. Nasal washings and aspirates are unacceptable for Xpert Xpress SARS-CoV-2/FLU/RSV testing.  Fact Sheet for Patients: [place]  Fact Sheet for Healthcare Providers: BloggerCourse.com  This test is not yet  approved or cleared by the [place] and has been authorized for detection and/or diagnosis of SARS-CoV-2 by FDA under an Emergency Use Authorization (EUA). This EUA will remain in effect (meaning this test can be used) for the duration of the COVID-19 declaration under Section 564(b)(1) of the Act, 21 U.S.C. section 360bbb-3(b)(1), unless the authorization is terminated or revoked.  Performed at [facility], [address], [place], [place] [address]          Radiology Studies: No results found.      Scheduled Meds: . dabigatran  150 mg Oral Q12H  . diltiazem  120 mg Oral Daily  . furosemide  20 mg Oral Daily  . lidocaine  1 patch Transdermal Q24H  . metoprolol succinate  25 mg Oral Daily  . metoprolol succinate  25 mg Oral BID  . multivitamin with minerals  1 tablet Oral Daily   Continuous Infusions:  Assessment & Plan:   Active Problems:   Atrial fibrillation with rapid ventricular response (HCC)   1.  Atrial fibrillation with rapid  ventricular response. Was on cardizem gtt, switched to cardizem CD 120mg  qd Echo normal EF  Cardiology was following  Toprol was decreased to 25 mg daily from twice daily by cardiology however patient heart rate is elevated and cannot ambulate with PT.  Will go back to her original dose of 50 but will divide to Toprol-XL 25 mg twice daily  4/16 -however he still has room for improvement, will increase Toprol-XL to 37.5 twice daily with parameters  Continue Pradaxa  Follow-up with cardiology as outpatient     2.Acute on chronic DHF- more euvolemic on exam Clinically improved HR and bp control Echo nml EF 4/16 euvolemic on exam  BNP down  Continue low-dose Lasix  I's and O's  Daily with     3.  L2 compression fracture possibly pathological secondary to osteoporosis. Continue with pain management  Neurosurgery consulted, input was appreciated, will obtain L spine MRI Consider kyphoplasty if she cannot ambulate with LSBo 4/16 continue LSO when out of bed PT recommends SNF  Per neurosurgery consider kyphoplasty if she cannot ambulate with LSO Neurosurgery will arrange follow-up as outpatient     4.  Acute respiratory failure with hypoxia, likely secondary to acute DHF. Improved Continue incentive spirometer  Continue Lasix We will need to get an ambulatory O2 sat   5.  Hypertensive urgency, likely contributing to her acute CHF. improved Continue to monitor   6.  Recent UTI status post treatment with Cipro. -Urinalysis currently negative.  She finished taking Cipro.    DVT prophylaxis: Pradaxa Code Status: Full Family Communication: Daughter at bedside  Status is: Inpatient  Remains inpatient appropriate because:Inpatient level of care appropriate due to severity of illness   Dispo: The patient is from: Home              Anticipated d/c is to: SNF              Patient currently is not medically stable to d/c.   Difficult to place patient No             LOS: 4 days   Time spent: 35 minutes with more than 50% on COC    5/16, MD [facility] Pager [phone]  If 7PM-7AM, please contact night-coverage 05/09/2020, 8:50 AM

## 2020-05-10 DIAGNOSIS — M545 Low back pain, unspecified: Secondary | ICD-10-CM

## 2020-05-10 LAB — BASIC METABOLIC PANEL
Anion gap: 12 (ref 5–15)
BUN: 16 mg/dL (ref 8–23)
CO2: 30 mmol/L (ref 22–32)
Calcium: 9 mg/dL (ref 8.9–10.3)
Chloride: 90 mmol/L — ABNORMAL LOW (ref 98–111)
Creatinine, Ser: 0.66 mg/dL (ref 0.44–1.00)
GFR, Estimated: >60 mL/min (ref >60)
Glucose, Bld: 102 mg/dL — ABNORMAL HIGH (ref 70–99)
Potassium: 3.7 mmol/L (ref 3.5–5.1)
Sodium: 132 mmol/L — ABNORMAL LOW (ref 135–145)

## 2020-05-10 MED ORDER — METOPROLOL SUCCINATE ER 25 MG PO TB24
25.0000 mg | ORAL_TABLET | Freq: Two times a day (BID) | ORAL | Status: DC
  Administered 2020-05-10 – 2020-05-15 (×11): 25 mg via ORAL
  Filled 2020-05-10 (×11): qty 1

## 2020-05-10 MED ORDER — METOPROLOL SUCCINATE ER 25 MG PO TB24: 25.0000 mg | ORAL_TABLET | Freq: Two times a day (BID) | ORAL | Status: DC

## 2020-05-10 NOTE — Progress Notes (Signed)
PROGRESS NOTE    Tracy Singh  WCB:[id number] DOB: September 19, 1924 DOA: 05/05/2020 PCP: Tracy Milan, MD    Brief Narrative:  Tracy Singh is a 87 y.o. Caucasian female with medical history significant for atrial fibrillation on Pradaxa and von Willebrand disease, who presented to the emergency room with acute onset of low back pain for which she was recently seen by Dr. Martha Singh and prescribed Robaxin with mild help.  She has been having urinary frequency and urgency and felt to have a UTI.  She was seen by her cardiologist and given p.o. Cipro. She does have bilateral lower extremity edema.  Her pulse extremities dropped to 85% on room air in the ER and she was placed on 2 L of O2 by nasal cannula.ED Course: Upon presentation to the emergency room blood pressure was 186/152 with a heart rate of 137 and she was in rapid atrial fibrillation.  Labs revealed mild hyponatremia.BNP was 724.5 and high-sensitivity troponin I was 11 and later 9.  Influenza antigens and COVID-19 PCR came back negative   EKG was atrial fibrillation with RVR.  Lumbar spine x-ray showed L2 superior endplate fracture with approximately 30 to 40% loss of height and moderate degenerative disc disease at L4-L5 with cystic grade 1 anterolisthesis as well as cholelithiasis.  T-spine x-ray showed no acute abnormalities.   4/14- awaiting brace. 4/15-PT rec. SNF. Brace here. Pt with no new complaints. No overnight issues.  On telemetry with physical therapy heart rate increased to the 120s.  Given extra dose of Toprol-XL 25. 4/16-heart rate from 102 to 110 at rest in bed.  Patient denies any dizziness, chest pain or shortness of breath 4/17- while I was in room pt working with PT, as she got up HR 130's in afib. NSg reported pt's daughter did not want her to get beta blk at scheduled am time but to be given at 12 PM as she takes it at home.  Also found out had ordered Toprol-XL twice daily but daughter had questions now  and wanted it once a day.  Consultants:   Cardiology, neurosurgery  Procedures:   Antimicrobials:       Subjective: Patient denies shortness of breath, chest pain, palpitation or dizziness.  Objective: Vitals:   05/09/20 1614 05/09/20 1842 05/10/20 0500 05/10/20 0740  BP: 124/68 (!) 142/67  130/81  Pulse: 92 92  85  Resp: 16 16  16   Temp: 97.6 F (36.4 C) 97.6 F (36.4 C)  97.8 F (36.6 C)  TempSrc: Oral Oral    SpO2: 97% 99%  94%  Weight:   59.8 kg   Height:        Intake/Output Summary (Last 24 hours) at 05/10/2020 0943 Last data filed at 05/10/2020 0256 Gross per 24 hour  Intake 240 ml  Output 300 ml  Net -60 ml   Filed Weights   05/05/20 0039 05/07/20 0517 05/10/20 0500  Weight: 67.6 kg 61.7 kg 59.8 kg    Examination: Calm, NAD CTA no wheeze rales rhonchi's Irregular S1-S2 no gallops Soft benign positive breath sounds No edema Awake alert grossly intact Mood and affect appropriate in current setting    Data Reviewed: I have personally reviewed following labs and imaging studies  CBC: Recent Labs  Lab 05/05/20 0043 05/05/20 0517 05/08/20 0720  WBC 7.0 9.0 8.4  NEUTROABS 4.5  --   --   HGB 14.1 13.7 15.8*  HCT 41.1 40.3 45.6  MCV 91.5 92.6 90.1  PLT 279  266 288   Basic Metabolic Panel: Recent Labs  Lab 05/05/20 0045 05/05/20 0517 05/06/20 0556 05/07/20 0613 05/08/20 0720 05/09/20 0524 05/10/20 0532  NA  --    < > 132* 132* 128* 132* 132*  K  --    < > 3.8 3.5 3.9 3.7 3.7  CL  --    < > 91* 91* 86* 90* 90*  CO2  --    < > 30 32 30 31 30   GLUCOSE  --    < > 110* 113* 130* 116* 102*  BUN  --    < > 14 15 15 15 16   CREATININE  --    < > 0.85 0.78 0.82 0.80 0.66  CALCIUM  --    < > 9.3 8.8* 9.0 8.9 9.0  MG 2.0  --   --   --   --   --   --    < > = values in this interval not displayed.   GFR: Estimated Creatinine Clearance: 37.9 mL/min (by C-G formula based on SCr of 0.66 mg/dL). Liver Function Tests: Recent Labs  Lab  05/05/20 0043  AST 26  ALT 15  ALKPHOS 83  BILITOT 0.8  PROT 7.5  ALBUMIN 3.9   No results for input(s): LIPASE, AMYLASE in the last 168 hours. No results for input(s): AMMONIA in the last 168 hours. Coagulation Profile: Recent Labs  Lab 05/05/20 0043  INR 1.8*   Cardiac Enzymes: No results for input(s): CKTOTAL, CKMB, CKMBINDEX, TROPONINI in the last 168 hours. BNP (last 3 results) No results for input(s): PROBNP in the last 8760 hours. HbA1C: No results for input(s): HGBA1C in the last 72 hours. CBG: No results for input(s): GLUCAP in the last 168 hours. Lipid Profile: No results for input(s): CHOL, HDL, LDLCALC, TRIG, CHOLHDL, LDLDIRECT in the last 72 hours. Thyroid Function Tests: No results for input(s): TSH, T4TOTAL, FREET4, T3FREE, THYROIDAB in the last 72 hours. Anemia Panel: No results for input(s): VITAMINB12, FOLATE, FERRITIN, TIBC, IRON, RETICCTPCT in the last 72 hours. Sepsis Labs: No results for input(s): PROCALCITON, LATICACIDVEN in the last 168 hours.  Recent Results (from the past 240 hour(s))  Resp Panel by RT-PCR (Flu A&B, Covid) Nasopharyngeal Swab     Status: None   Collection Time: 05/05/20  1:26 AM   Specimen: Nasopharyngeal Swab; Nasopharyngeal(NP) swabs in vial transport medium  Result Value Ref Range Status   SARS Coronavirus 2 by RT PCR NEGATIVE NEGATIVE Final    Comment: (NOTE) SARS-CoV-2 target nucleic acids are NOT DETECTED.  The SARS-CoV-2 RNA is generally detectable in upper respiratory specimens during the acute phase of infection. The lowest concentration of SARS-CoV-2 viral copies this assay can detect is 138 copies/mL. A negative result does not preclude SARS-Cov-2 infection and should not be used as the sole basis for treatment or other patient management decisions. A negative result may occur with  improper specimen collection/handling, submission of specimen other than nasopharyngeal swab, presence of viral mutation(s) within  the areas targeted by this assay, and inadequate number of viral copies(<138 copies/mL). A negative result must be combined with clinical observations, patient history, and epidemiological information. The expected result is Negative.  Fact Sheet for Patients:  07/05/20  Fact Sheet for Healthcare Providers:  07/05/20  This test is no t yet approved or cleared by the [employer] FDA and  has been authorized for detection and/or diagnosis of SARS-CoV-2 by FDA under an Emergency Use Authorization (EUA). This EUA will remain  in effect (meaning this test can be used) for the duration of the COVID-19 declaration under Section 564(b)(1) of the Act, 21 U.S.C.section 360bbb-3(b)(1), unless the authorization is terminated  or revoked sooner.       Influenza A by PCR NEGATIVE NEGATIVE Final   Influenza B by PCR NEGATIVE NEGATIVE Final    Comment: (NOTE) The Xpert Xpress SARS-CoV-2/FLU/RSV plus assay is intended as an aid in the diagnosis of influenza from Nasopharyngeal swab specimens and should not be used as a sole basis for treatment. Nasal washings and aspirates are unacceptable for Xpert Xpress SARS-CoV-2/FLU/RSV testing.  Fact Sheet for Patients: [employer]Course.comhttps://www.fda.gov/media/152166/download  Fact Sheet for Healthcare Providers: SeriousBroker.ithttps://www.fda.gov/media/152162/download  This test is not yet approved or cleared by the [place] FDA and has been authorized for detection and/or diagnosis of SARS-CoV-2 by FDA under an Emergency Use Authorization (EUA). This EUA will remain in effect (meaning this test can be used) for the duration of the COVID-19 declaration under Section 564(b)(1) of the Act, 21 U.S.C. section 360bbb-3(b)(1), unless the authorization is terminated or revoked.  Performed at [facility], [address] [place], [place] [address]          Radiology Studies: No results  found.      Scheduled Meds: . dabigatran  150 mg Oral Q12H  . diltiazem  120 mg Oral Daily  . furosemide  20 mg Oral Daily  . lidocaine  1 patch Transdermal Q24H  . metoprolol succinate  37.5 mg Oral BID  . multivitamin with minerals  1 tablet Oral Daily   Continuous Infusions:  Assessment & Plan:   Active Problems:   Atrial fibrillation with rapid ventricular response (HCC)   1.  Atrial fibrillation with rapid ventricular response. Was on cardizem gtt, switched to cardizem CD 120mg  qd Echo normal EF  Cardiology was following  Toprol was decreased to 25 mg daily from twice daily by cardiology however patient heart rate is elevated and cannot ambulate with PT.  Will go back to her original dose of 50 but will divide to Toprol-XL 25 mg twice daily  44/17- HR not controlled at times with ambulation as I found out daughter not wanting to give beta blk bid, until I explained to her why I was giving it . Also wanted to beta-blockers to be given later in the day and I also discussed this with them again.  Daughter now agreeable to twice daily dosing and to be given as scheduled time. Will keep Toprol-XL 25 twice daily and not increase at this time since she did not receive the medication at twice daily dosing. Continue Pradaxa Follow-up with cardiology as outpatient      2.Acute on chronic DHF- more euvolemic on exam Clinically improved .  Heart rate to be better controlled Continue Lasix low-dose p.o. Continue beta-blockers I's and O's Daily weight    3.  L2 compression fracture possibly pathological secondary to osteoporosis. Continue with pain management  Neurosurgery consulted, input was appreciated, will obtain L spine MRI Consider kyphoplasty if she cannot ambulate with LSBo 4/17- continue LSO when out of bed PT rec. SNF Per neurosurgery consider pyeloplasty if she cannot ambulate with LSO Neurosurgery will arrange follow-up as outpatient     4.  Acute  respiratory failure with hypoxia, likely secondary to acute DHF. Improved Continue IS Continue lasix Need ambulatory 02 sat prior to dc    5.  Hypertensive urgency, likely contributing to her acute CHF. improved Continue to monitor   6.  Recent UTI status post treatment with Cipro. -Urinalysis currently negative.  She finished taking Cipro.    DVT prophylaxis: Pradaxa Code Status: Full Family Communication: Daughter at bedside  Status is: Inpatient  Remains inpatient appropriate because:Inpatient level of care appropriate due to severity of illness   Dispo: The patient is from: Home              Anticipated d/c is to: SNF              Patient currently is not medically stable to d/c.   Difficult to place patient No    Need HR better controlled         LOS: 5 days   Time spent: 35 minutes with more than 50% on COC    Lynn Ito, MD [facility] Pager [phone]  If 7PM-7AM, please contact night-coverage 05/10/2020, 9:43 AM

## 2020-05-10 NOTE — Progress Notes (Signed)
Yesterday pts daughter requested that her moms metoprolol due at 1000 be held until 1230 because "that's when she normally takes it". Daughter also asked night shift nurses to not give evening dose of metoprolol due to the fact that the metoprolol is a "24 hour dose". This nurse made Dr Marylu Lund aware of this today at noon and Dr Marylu Lund called the daughter and explained to her the importance of this medication being administered as ordered. I spoke with daughter after the fact and she acknowledged to me that she understood and was ok with the dose being administered as ordered.

## 2020-05-10 NOTE — Progress Notes (Signed)
Physical Therapy Treatment Patient Details Name: Tracy Singh MRN: [id number] DOB: 08/01/1924 Today's Date: 05/10/2020    History of Present Illness Pt is a 87 y/o F with PMH that includes Afib and Von Willebrand disease. Pt presented with chief c/o low back pain (stated started 6WA and was started on muscle relaxers by Martha Clan). Pt with increased pain starting on 4/11 and presented to ED. L-spine XR from 4/12 revealed L2 compression fracture of unknown duration. Pt recommended to wear LSO with ambulation and is currently being managed conservatively, but neuro sx consulted for potential kyphoplasty if pt fails conservative management.    PT Comments    .Participated in exercises as described below.  Pt initially declined session but agrees with encouragement.  Stated she had walked about an hour earlier with OT.  Participated in exercises as described below. Stood x 2 with RW and min a x 1.  HR increases to 158 briefly but remains tachy with static standing.  Gait deferred.     Follow Up Recommendations  SNF;Supervision/Assistance - 24 hour     Equipment Recommendations       Recommendations for Other Services       Precautions / Restrictions Precautions Precautions: Back;Fall Required Braces or Orthoses: Spinal Brace Spinal Brace: Thoracolumbosacral orthotic;Applied in sitting position Restrictions Other Position/Activity Restrictions: Per OT eval pt requires lumbar brace for ambulation but able to sit EOB w/o.    Mobility  Bed Mobility Overal bed mobility: Needs Assistance Bed Mobility: Sidelying to Sit   Sidelying to sit: Mod assist       General bed mobility comments: in recliner before and after session    Transfers Overall transfer level: Needs assistance Equipment used: Rolling walker (2 wheeled) Transfers: Sit to/from Stand Sit to Stand: Min assist         General transfer comment: MIN A to stand from EOB and recliner, but requires MIN/MOD from  commode d/t lower height  Ambulation/Gait         Gait velocity: decreased   General Gait Details: limited by HR increasing to 158 briefly   Stairs             Wheelchair Mobility    Modified Rankin (Stroke Patients Only)       Balance Overall balance assessment: Needs assistance Sitting-balance support: No upper extremity supported Sitting balance-Leahy Scale: Good Sitting balance - Comments: UE support to sustain static sit   Standing balance support: Bilateral upper extremity supported;During functional activity Standing balance-Leahy Scale: Fair Standing balance comment: reliant on RW. min assist while turning for safety                            Cognition Arousal/Alertness: Awake/alert Behavior During Therapy: WFL for tasks assessed/performed Overall Cognitive Status: Within Functional Limits for tasks assessed                                 General Comments: pt is HOH so some delayed responses, but overall appropriate conversationally and appropriate with following commands. Supportive dtr present throughout session. Pt has A-fib since 2018.      Exercises Other Exercises Other Exercises: seated AROM BLE x 10    General Comments        Pertinent Vitals/Pain Pain Assessment: Faces Faces Pain Scale: Hurts little more Pain Location: LBP Pain Descriptors / Indicators: Discomfort;Grimacing Pain Intervention(s): Limited activity within  patient's tolerance;Monitored during session;Repositioned    Home Living                      Prior Function            PT Goals (current goals can now be found in the care plan section) Acute Rehab PT Goals Patient Stated Goal: return home once able Progress towards PT goals: Progressing toward goals    Frequency    7X/week      PT Plan Current plan remains appropriate    Co-evaluation              AM-PAC PT "6 Clicks" Mobility   Outcome Measure  Help  needed turning from your back to your side while in a flat bed without using bedrails?: A Little Help needed moving from lying on your back to sitting on the side of a flat bed without using bedrails?: A Lot Help needed moving to and from a bed to a chair (including a wheelchair)?: A Lot Help needed standing up from a chair using your arms (e.g., wheelchair or bedside chair)?: A Little Help needed to walk in hospital room?: A Little Help needed climbing 3-5 steps with a railing? : A Lot 6 Click Score: 15    End of Session Equipment Utilized During Treatment: Back brace Activity Tolerance: Patient tolerated treatment well;Patient limited by pain Patient left: in chair;with chair alarm set;with call bell/phone within reach;with family/visitor present Nurse Communication: Mobility status PT Visit Diagnosis: Difficulty in walking, not elsewhere classified (R26.2);Muscle weakness (generalized) (M62.81);Pain     Time: 1130-1145 PT Time Calculation (min) (ACUTE ONLY): 15 min  Charges:  $Therapeutic Exercise: 8-22 mins                    Danielle Dess, PTA 05/10/20, 12:19 PM

## 2020-05-10 NOTE — Progress Notes (Signed)
Occupational Therapy Treatment Patient Details Name: Tracy Singh MRN: [id number] DOB: 1924/07/07 Today's Date: 05/10/2020    History of present illness Pt is a 87 y/o F with PMH that includes Afib and Von Willebrand disease. Pt presented with chief c/o low back pain (stated started 6WA and was started on muscle relaxers by Martha Clan). Pt with increased pain starting on 4/11 and presented to ED. L-spine XR from 4/12 revealed L2 compression fracture of unknown duration. Pt recommended to wear LSO with ambulation and is currently being managed conservatively, but neuro sx consulted for potential kyphoplasty if pt fails conservative management.   OT comments  Pt seen for OT treatment this date to f/u re: safety with ADLs/ADL mobility. Pt requires MIN cues and MIN A to come to EOB sitting using logroll technique. Pt demos F sitting balance with UE support. Pt's brace applied in sitting with daughter showing good knowledge and understanding of use and placement. Pt c/o dizziness in sitting and engaged in 2 sets x10 reps ankle pumps and reports feeling more normal. Pt engaged in STS with MIN A to RW and perform fxl mobility with CGA to restroom (MIN A for turning/pivoting), cues for safe use of RW including hand placement. Pt requires MIN/MOD A with commode transfer d/t lower surface. Pt requires CGA to stand at the sink and perform hand hygiene with MIN cues to navigate where items such as soap and towels are located. Pt requires CGA for fxl mobility back into room and MIN A for transfer to chair showing goof carryover of cues for hand placement as she reaches back to sit without prompting. Pt left in chair with chair alarm. Dtr present in room throughout and RN presenting to administer medications. Will continue to follow.    Follow Up Recommendations  SNF    Equipment Recommendations  3 in 1 bedside commode;Tub/shower seat    Recommendations for Other Services      Precautions / Restrictions  Precautions Precautions: Back;Fall Required Braces or Orthoses: Spinal Brace Spinal Brace: Thoracolumbosacral orthotic;Applied in sitting position Restrictions Other Position/Activity Restrictions: Per OT eval pt requires lumbar brace for ambulation but able to sit EOB w/o.       Mobility Bed Mobility Overal bed mobility: Needs Assistance Bed Mobility: Sidelying to Sit   Sidelying to sit: Mod assist       General bed mobility comments: requires cues for hand placement/sequence and assist to scoot to EOB.    Transfers Overall transfer level: Needs assistance Equipment used: Rolling walker (2 wheeled) Transfers: Sit to/from Stand Sit to Stand: Min assist;Mod assist         General transfer comment: MIN A to stand from EOB and recliner, but requires MIN/MOD from commode d/t lower height    Balance Overall balance assessment: Needs assistance Sitting-balance support: No upper extremity supported Sitting balance-Leahy Scale: Fair Sitting balance - Comments: UE support to sustain static sit   Standing balance support: Bilateral upper extremity supported;During functional activity Standing balance-Leahy Scale: Poor Standing balance comment: reliant on RW. min assist while turning for safety                           ADL either performed or assessed with clinical judgement   ADL Overall ADL's : Needs assistance/impaired                         Toilet Transfer: Minimal assistance;RW;Grab bars;Ambulation;Moderate assistance Toilet  Transfer Details (indicate cue type and reason): MIN A to sit, but MIN/MOD A to CTS. Primarily struggles with coming to stand as it is diffuclt for her to bend R knee to get foot under her for better BOS. Toileting- Clothing Manipulation and Hygiene: Set up;Sitting/lateral lean Toileting - Clothing Manipulation Details (indicate cue type and reason): for anterior peri care after voiding     Functional mobility during ADLs:  Min guard;Rolling walker (to/from restroom, brace donned throughout)       Vision Baseline Vision/History: Wears glasses Patient Visual Report: No change from baseline     Perception     Praxis      Cognition Arousal/Alertness: Awake/alert Behavior During Therapy: WFL for tasks assessed/performed Overall Cognitive Status: Within Functional Limits for tasks assessed                                 General Comments: pt is HOH so some delayed responses, but overall appropriate conversationally and appropriate with following commands. Supportive dtr present throughout session. Pt has A-fib since 2018.        Exercises Other Exercises Other Exercises: OT facilitates ed re: brace wear, safe hand placement with RW for ADL transfers, safe transfer technique, postural exercise for upper back while in sitting to improve standing posture and balance. GS x10 in sitting   Shoulder Instructions       General Comments      Pertinent Vitals/ Pain       Pain Assessment: Faces Faces Pain Scale: Hurts little more Pain Location: LBP Pain Descriptors / Indicators: Discomfort;Grimacing Pain Intervention(s): Limited activity within patient's tolerance;Monitored during session;Repositioned  Home Living                                          Prior Functioning/Environment              Frequency  Min 2X/week        Progress Toward Goals  OT Goals(current goals can now be found in the care plan section)  Progress towards OT goals: Progressing toward goals  Acute Rehab OT Goals Patient Stated Goal: return home once able OT Goal Formulation: With patient Time For Goal Achievement: 05/21/20 Potential to Achieve Goals: Good  Plan Discharge plan remains appropriate    Co-evaluation                 AM-PAC OT "6 Clicks" Daily Activity     Outcome Measure   Help from another person eating meals?: None Help from another person taking care  of personal grooming?: A Little Help from another person toileting, which includes using toliet, bedpan, or urinal?: A Lot Help from another person bathing (including washing, rinsing, drying)?: A Lot Help from another person to put on and taking off regular upper body clothing?: A Little Help from another person to put on and taking off regular lower body clothing?: A Lot 6 Click Score: 16    End of Session Equipment Utilized During Treatment: Gait belt;Rolling walker;Back brace  OT Visit Diagnosis: Unsteadiness on feet (R26.81);Muscle weakness (generalized) (M62.81);Pain Pain - part of body:  (back)   Activity Tolerance Patient tolerated treatment well;Patient limited by pain;Other (comment)   Patient Left in bed;with call bell/phone within reach;with bed alarm set;with family/visitor present   Nurse Communication Mobility status  Time: 5465-0354 OT Time Calculation (min): 38 min  Charges: OT General Charges $OT Visit: 1 Visit OT Treatments $Self Care/Home Management : 8-22 mins $Therapeutic Activity: 8-22 mins $Therapeutic Exercise: 8-22 mins  Rejeana Brock, MS, OTR/L ascom [phone] 05/10/20, 11:45 AM

## 2020-05-11 NOTE — Progress Notes (Signed)
PROGRESS NOTE    Tracy Singh  TKW:[id number] DOB: 1924/06/22 DOA: 05/05/2020 PCP: Reubin Milan, MD    Brief Narrative:  Tracy Singh is a 87 y.o. Caucasian female with medical history significant for atrial fibrillation on Pradaxa and von Willebrand disease, who presented to the emergency room with acute onset of low back pain for which she was recently seen by Dr. Martha Clan and prescribed Robaxin with mild help.  She has been having urinary frequency and urgency and felt to have a UTI.  She was seen by her cardiologist and given p.o. Cipro. She does have bilateral lower extremity edema.  Her pulse extremities dropped to 85% on room air in the ER and she was placed on 2 L of O2 by nasal cannula.ED Course: Upon presentation to the emergency room blood pressure was 186/152 with a heart rate of 137 and she was in rapid atrial fibrillation.  Labs revealed mild hyponatremia.BNP was 724.5 and high-sensitivity troponin I was 11 and later 9.  Influenza antigens and COVID-19 PCR came back negative   EKG was atrial fibrillation with RVR.  Lumbar spine x-ray showed L2 superior endplate fracture with approximately 30 to 40% loss of height and moderate degenerative disc disease at L4-L5 with cystic grade 1 anterolisthesis as well as cholelithiasis.  T-spine x-ray showed no acute abnormalities.   4/14- awaiting brace. 4/15-PT rec. SNF. Brace here. Pt with no new complaints. No overnight issues.  On telemetry with physical therapy heart rate increased to the 120s.  Given extra dose of Toprol-XL 25. 4/16-heart rate from 102 to 110 at rest in bed.  Patient denies any dizziness, chest pain or shortness of breath 4/17- while I was in room pt working with PT, as she got up HR 130's in afib. NSg reported pt's daughter did not want her to get beta blk at scheduled am time but to be given at 12 PM as she takes it at home.  Also found out had ordered Toprol-XL twice daily but daughter had questions now  and wanted it once a day. 4/18- HR at rest 80's.   Consultants:   Cardiology, neurosurgery  Procedures:   Antimicrobials:       Subjective: Denies sob, cp, dizziness. Back pain is stable unless she moves in certain position  Objective: Vitals:   05/10/20 0740 05/10/20 1131 05/10/20 1617 05/11/20 0400  BP: 130/81 (!) 127/96 122/83 124/86  Pulse: 85 93 78 76  Resp: 16 16  18   Temp: 97.8 F (36.6 C) 98.2 F (36.8 C) 98.6 F (37 C) 98.3 F (36.8 C)  TempSrc:  Oral Oral Oral  SpO2: 94% 91% 95% 93%  Weight:      Height:        Intake/Output Summary (Last 24 hours) at 05/11/2020 0849 Last data filed at 05/11/2020 0412 Gross per 24 hour  Intake 150 ml  Output 1050 ml  Net -900 ml   Filed Weights   05/05/20 0039 05/07/20 0517 05/10/20 0500  Weight: 67.6 kg 61.7 kg 59.8 kg    Examination: Cad,nad cta no r/r/w irreg s1/s2 no gallop Soft benign +bs No edema Aaxox3, grossly intact    Data Reviewed: I have personally reviewed following labs and imaging studies  CBC: Recent Labs  Lab 05/05/20 0043 05/05/20 0517 05/08/20 0720  WBC 7.0 9.0 8.4  NEUTROABS 4.5  --   --   HGB 14.1 13.7 15.8*  HCT 41.1 40.3 45.6  MCV 91.5 92.6 90.1  PLT 279 266  288   Basic Metabolic Panel: Recent Labs  Lab 05/05/20 0045 05/05/20 0517 05/06/20 0556 05/07/20 0613 05/08/20 0720 05/09/20 0524 05/10/20 0532  NA  --    < > 132* 132* 128* 132* 132*  K  --    < > 3.8 3.5 3.9 3.7 3.7  CL  --    < > 91* 91* 86* 90* 90*  CO2  --    < > 30 32 30 31 30   GLUCOSE  --    < > 110* 113* 130* 116* 102*  BUN  --    < > 14 15 15 15 16   CREATININE  --    < > 0.85 0.78 0.82 0.80 0.66  CALCIUM  --    < > 9.3 8.8* 9.0 8.9 9.0  MG 2.0  --   --   --   --   --   --    < > = values in this interval not displayed.   GFR: Estimated Creatinine Clearance: 37.9 mL/min (by C-G formula based on SCr of 0.66 mg/dL). Liver Function Tests: Recent Labs  Lab 05/05/20 0043  AST 26  ALT 15  ALKPHOS  83  BILITOT 0.8  PROT 7.5  ALBUMIN 3.9   No results for input(s): LIPASE, AMYLASE in the last 168 hours. No results for input(s): AMMONIA in the last 168 hours. Coagulation Profile: Recent Labs  Lab 05/05/20 0043  INR 1.8*   Cardiac Enzymes: No results for input(s): CKTOTAL, CKMB, CKMBINDEX, TROPONINI in the last 168 hours. BNP (last 3 results) No results for input(s): PROBNP in the last 8760 hours. HbA1C: No results for input(s): HGBA1C in the last 72 hours. CBG: No results for input(s): GLUCAP in the last 168 hours. Lipid Profile: No results for input(s): CHOL, HDL, LDLCALC, TRIG, CHOLHDL, LDLDIRECT in the last 72 hours. Thyroid Function Tests: No results for input(s): TSH, T4TOTAL, FREET4, T3FREE, THYROIDAB in the last 72 hours. Anemia Panel: No results for input(s): VITAMINB12, FOLATE, FERRITIN, TIBC, IRON, RETICCTPCT in the last 72 hours. Sepsis Labs: No results for input(s): PROCALCITON, LATICACIDVEN in the last 168 hours.  Recent Results (from the past 240 hour(s))  Resp Panel by RT-PCR (Flu A&B, Covid) Nasopharyngeal Swab     Status: None   Collection Time: 05/05/20  1:26 AM   Specimen: Nasopharyngeal Swab; Nasopharyngeal(NP) swabs in vial transport medium  Result Value Ref Range Status   SARS Coronavirus 2 by RT PCR NEGATIVE NEGATIVE Final    Comment: (NOTE) SARS-CoV-2 target nucleic acids are NOT DETECTED.  The SARS-CoV-2 RNA is generally detectable in upper respiratory specimens during the acute phase of infection. The lowest concentration of SARS-CoV-2 viral copies this assay can detect is 138 copies/mL. A negative result does not preclude SARS-Cov-2 infection and should not be used as the sole basis for treatment or other patient management decisions. A negative result may occur with  improper specimen collection/handling, submission of specimen other than nasopharyngeal swab, presence of viral mutation(s) within the areas targeted by this assay, and  inadequate number of viral copies(<138 copies/mL). A negative result must be combined with clinical observations, patient history, and epidemiological information. The expected result is Negative.  Fact Sheet for Patients:  07/05/20  Fact Sheet for Healthcare Providers:  07/05/20  This test is no t yet approved or cleared by the [employer] FDA and  has been authorized for detection and/or diagnosis of SARS-CoV-2 by FDA under an Emergency Use Authorization (EUA). This EUA will remain  in effect (meaning this test can be used) for the duration of the COVID-19 declaration under Section 564(b)(1) of the Act, 21 U.S.C.section 360bbb-3(b)(1), unless the authorization is terminated  or revoked sooner.       Influenza A by PCR NEGATIVE NEGATIVE Final   Influenza B by PCR NEGATIVE NEGATIVE Final    Comment: (NOTE) The Xpert Xpress SARS-CoV-2/FLU/RSV plus assay is intended as an aid in the diagnosis of influenza from Nasopharyngeal swab specimens and should not be used as a sole basis for treatment. Nasal washings and aspirates are unacceptable for Xpert Xpress SARS-CoV-2/FLU/RSV testing.  Fact Sheet for Patients: [employer]comhttps://www.fda.gov/media/152166/download  Fact Sheet for Healthcare Providers: Serious[employer]ker.ithttps://www.fda.gov/media/152162/download  This test is not yet approved or cleared by the [place] FDA and has been authorized for detection and/or diagnosis of SARS-CoV-2 by FDA under an Emergency Use Authorization (EUA). This EUA will remain in effect (meaning this test can be used) for the duration of the COVID-19 declaration under Section 564(b)(1) of the Act, 21 U.S.C. section 360bbb-3(b)(1), unless the authorization is terminated or revoked.  Performed at [facility], [address] [place], [place] [id number]          Radiology Studies: No results found.      Scheduled Meds: .  dabigatran  150 mg Oral Q12H  . diltiazem  120 mg Oral Daily  . furosemide  20 mg Oral Daily  . lidocaine  1 patch Transdermal Q24H  . metoprolol succinate  25 mg Oral BID  . multivitamin with minerals  1 tablet Oral Daily   Continuous Infusions:  Assessment & Plan:   Active Problems:   Atrial fibrillation with rapid ventricular response (HCC)   1.  Atrial fibrillation with rapid ventricular response. Was on cardizem gtt, switched to cardizem CD 120mg  qd Echo normal EF  Cardiology was following  Toprol was decreased to 25 mg daily from twice daily by cardiology however patient heart rate is elevated and cannot ambulate with PT.  Will go back to her original dose of 50 but will divide to Toprol-XL 25 mg twice daily  4/17- HR not controlled at times with ambulation as I found out daughter not wanting to give beta blk bid, until I explained to her why I was giving it . Also wanted to beta-blockers to be given later in the day and I also discussed this with them again.  Daughter now agreeable to twice daily dosing and to be given as scheduled time. Will keep Toprol-XL 25 twice daily and not increase at this time since she did not receive the medication at twice daily dosing. 4/18- at rest HR controlled, need to evaluate with ambulation as that's when HR is not controlled. Continue pradaxa F/u cards as outpt       2.Acute on chronic DHF- more euvolemic on exam Clinically improved Need to keep HR controlled Continue lasix  Continue beta blk Daily wt , I/o     3.  L2 compression fracture possibly pathological secondary to osteoporosis. Continue with pain management  Neurosurgery consulted, input was appreciated, will obtain L spine MRI Consider kyphoplasty if she cannot ambulate with LSBo 4/18-LSO when out of bed SNF pending PT Neurosurgery will consider kyphoplasty if she cannot ambulate with LSO F/u with neurosx as outpt      4.  Acute respiratory failure with  hypoxia, likely secondary to acute DHF. Improved Continue IS Keep 02 sat >92%   5.  Hypertensive urgency, likely contributing to her acute CHF. improved  Continue to monitor   6.  Recent UTI status post treatment with Cipro. -Urinalysis currently negative.  She finished taking Cipro.    DVT prophylaxis: Pradaxa Code Status: Full Family Communication: Daughter at bedside  Status is: Inpatient  Remains inpatient appropriate because:Inpatient level of care appropriate due to severity of illness   Dispo: The patient is from: Home              Anticipated d/c is to: SNF              Patient currently is not medically stable to d/c.   Difficult to place patient No    Need HR better controlled         LOS: 6 days   Time spent: 35 minutes with more than 50% on COC    Lynn Ito, MD [facility] Pager [phone]  If 7PM-7AM, please contact night-coverage 05/11/2020, 8:49 AM

## 2020-05-11 NOTE — Progress Notes (Signed)
[facility] Cardiology [facility] Encounter Note  Patient: Tracy Singh / Admit Date: 05/05/2020 / Date of Encounter: 05/11/2020, 8:08 AM   Subjective: Patient overall feels quite well today and slept well last night.  No evidence of significant major pain from her back.  No ambulation at this time the which could cause her to have further significant symptoms.  Patient has had variable heart rate control especially with activities and pain.  Addition of evening dose of Toprol-XL appears to have helped with heart rate control and will continue this dosage at this time for need in controlling ambulatory heart rate.  No current evidence of angina or congestive heart failure  Review of Systems: Positive for: None Negative for: Vision change, hearing change, syncope, dizziness, nausea, vomiting,diarrhea, bloody stool, stomach pain, cough, congestion, diaphoresis, urinary frequency, urinary pain,skin lesions, skin rashes Others previously listed  Objective: Telemetry: Atrial fibrillation with controlled ventricular rate Physical Exam: Blood pressure 124/86, pulse 76, temperature 98.3 F (36.8 C), temperature source Oral, resp. rate 18, height 5\' 4"  (1.626 m), weight 59.8 kg, SpO2 93 %. Body mass index is 22.63 kg/m. General: Well developed, well nourished, in no acute distress. Head: Normocephalic, atraumatic, sclera non-icteric, no xanthomas, nares are without discharge. Neck: No apparent masses Lungs: Normal respirations with no wheezes, no rhonchi, no rales , no crackles   Heart: Irregular rate and rhythm, normal S1 S2, no murmur, no rub, no gallop, PMI is normal size and placement, carotid upstroke normal without bruit, jugular venous pressure normal Abdomen: Soft, non-tender, non-distended with normoactive bowel sounds. No hepatosplenomegaly. Abdominal aorta is normal size without bruit Extremities: No edema, no clubbing, no cyanosis, no ulcers,  Peripheral: 2+ radial, 2+ femoral, 2+  dorsal pedal pulses Neuro: Alert and oriented. Moves all extremities spontaneously. Psych:  Responds to questions appropriately with a normal affect.   Intake/Output Summary (Last 24 hours) at 05/11/2020 0808 Last data filed at 05/11/2020 0412 Gross per 24 hour  Intake 150 ml  Output 1050 ml  Net -900 ml    Inpatient Medications:  . dabigatran  150 mg Oral Q12H  . diltiazem  120 mg Oral Daily  . furosemide  20 mg Oral Daily  . lidocaine  1 patch Transdermal Q24H  . metoprolol succinate  25 mg Oral BID  . multivitamin with minerals  1 tablet Oral Daily   Infusions:   Labs: Recent Labs    05/09/20 0524 05/10/20 0532  NA 132* 132*  K 3.7 3.7  CL 90* 90*  CO2 31 30  GLUCOSE 116* 102*  BUN 15 16  CREATININE 0.80 0.66  CALCIUM 8.9 9.0   No results for input(s): AST, ALT, ALKPHOS, BILITOT, PROT, ALBUMIN in the last 72 hours. No results for input(s): WBC, NEUTROABS, HGB, HCT, MCV, PLT in the last 72 hours. No results for input(s): CKTOTAL, CKMB, TROPONINI in the last 72 hours. Invalid input(s): POCBNP No results for input(s): HGBA1C in the last 72 hours.   Weights: Filed Weights   05/05/20 0039 05/07/20 0517 05/10/20 0500  Weight: 67.6 kg 61.7 kg 59.8 kg     Radiology/Studies:  DG Thoracic Spine 2 View  Result Date: 05/05/2020 CLINICAL DATA:  Back pain EXAM: THORACIC SPINE 2 VIEWS COMPARISON:  None. FINDINGS: The osseous structures are diffusely osteopenic. Normal thoracic kyphosis. No listhesis. No acute fracture of the thoracic spine. Vertebral body height appears preserved. The paraspinal soft tissues are unremarkable. L2 superior endplate fracture again noted. IMPRESSION: No definite acute fracture or listhesis  of the thoracic spine. Electronically Signed   By: Helyn Numbers MD   On: 05/05/2020 02:34   DG Lumbar Spine Complete  Result Date: 05/05/2020 CLINICAL DATA:  Back pain EXAM: LUMBAR SPINE - COMPLETE 4+ VIEW COMPARISON:  None. FINDINGS: Five view radiograph  lumbar spine demonstrates normal lumbar lordosis. There is grade 1 anterolisthesis of L4 upon L5. Superior endplate fracture of L2 is noted, age indeterminate, with approximately 30-40% loss of height. This appears new since prior chest radiograph of 04/01/2018. Remaining vertebral body height has been preserved. There is intervertebral disc space narrowing at L4-5 in keeping with changes of moderate degenerative disc disease at this level. Remaining intervertebral disc heights are preserved. The paraspinal soft tissues are unremarkable. Vascular calcifications noted within the abdominal aorta. Cholelithiasis is incidentally noted. IMPRESSION: Age-indeterminate L2 superior endplate fracture with approximately 30-40% loss of height. Moderate degenerative disc disease L4-5 with cystic grade 1 anterolisthesis. Cholelithiasis Electronically Signed   By: Helyn Numbers MD   On: 05/05/2020 02:31   CT Angio Chest PE W and/or Wo Contrast  Result Date: 05/05/2020 CLINICAL DATA:  87 year old female with low back pain and hypoxia. EXAM: CT ANGIOGRAPHY CHEST WITH CONTRAST TECHNIQUE: Multidetector CT imaging of the chest was performed using the standard protocol during bolus administration of intravenous contrast. Multiplanar CT image reconstructions and MIPs were obtained to evaluate the vascular anatomy. CONTRAST:  Seventy-five mL Omnipaque 350, intravenous COMPARISON:  01/25/2010 FINDINGS: Cardiovascular: Satisfactory opacification of the pulmonary arteries to the segmental level. No evidence of pulmonary embolism. Severe biatrial cardiomegaly, advanced from 2012 comparison. No pericardial effusion. Aortic atherosclerotic calcifications. Coronary atherosclerotic calcifications. Mediastinum/Nodes: No enlarged mediastinal, hilar, or axillary lymph nodes. Thyroid gland, trachea, and esophagus demonstrate no significant findings. Lungs/Pleura: Bibasilar subsegmental atelectasis. Trace bilateral pleural effusions versus  dependent pleural thickening. Mild centrilobular emphysema. Similar appearing apical scarring with calcification. No suspicious pulmonary nodules. No pneumothorax. Upper Abdomen: No acute abnormality. Similar appearing multifocal simple hepatic cysts. Similar appearing simple cyst arising from the medial cortex of the left upper pole kidney. Small hiatal hernia. Musculoskeletal: No acute fracture. No aggressive appearing osseous lesion. Diffuse osteopenia. Review of the MIP images confirms the above findings. IMPRESSION: Vascular: 1. No evidence of pulmonary embolism. 2. Severe biatrial cardiomegaly. 3. Coronary and aortic atherosclerosis (ICD10-I70.0). Non-Vascular: Bibasilar subsegmental atelectasis. Marliss Coots, MD [facility] Electronically Signed   By: Marliss Coots MD   On: 05/05/2020 07:57   MR LUMBAR SPINE WO CONTRAST  Result Date: 05/06/2020 CLINICAL DATA:  Initial evaluation for acute trauma, back pain. EXAM: MRI LUMBAR SPINE WITHOUT CONTRAST TECHNIQUE: Multiplanar, multisequence MR imaging of the lumbar spine was performed. No intravenous contrast was administered. COMPARISON:  Prior radiograph from 05/05/2020 FINDINGS: Segmentation: Standard. Lowest well-formed disc space labeled the L5-S1 level. Alignment: Trace 2 mm anterolisthesis of L3 on L4, with 7 mm anterolisthesis of L4 on L5. Findings chronic and facet mediated. Alignment otherwise normal with preservation of the normal lumbar lordosis. Vertebrae: Acute compression fracture involving the superior endplate of L1. Associated central height loss of up to 20% without bony retropulsion. Additional acute compression fracture involving the superior endplate of L2. Associated 30% height loss with trace 2 mm bony retropulsion. These fractures are benign/mechanical in appearance. Otherwise, vertebral body height maintained with no other acute or chronic fracture. Underlying bone marrow signal  intensity within normal limits. No discrete or worrisome osseous lesions. Mild reactive marrow edema noted about the right L4-5 facet due to facet  arthritis. No other abnormal marrow edema. Conus medullaris and cauda equina: Conus extends to the L1-2 level. Conus and cauda equina appear normal. Paraspinal and other soft tissues: Mild paraspinous edema adjacent to the L1 and L2 compression fractures. Mild edema noted within the lower posterior paraspinous musculature as well, likely reflecting a degree of associated muscular injury/strain. Few benign appearing cyst noted within the kidneys bilaterally. Visualized visceral structures otherwise unremarkable. Disc levels: T12-L1: Mild disc bulge with disc desiccation. Superimposed small right foraminal to extraforaminal disc protrusion (series 8, image 3). No significant spinal stenosis. Foramina remain patent. L1-2: Mild disc bulge with disc desiccation. Trace 2 mm bony retropulsion related to the L2 compression fracture. Mild facet hypertrophy. No significant spinal stenosis. Foramina remain patent. L2-3: Diffuse disc bulge with disc desiccation. Mild facet and ligament flavum hypertrophy. No more than mild narrowing of the lateral recesses bilaterally. Central canal remains patent. No significant foraminal stenosis. L3-4: Trace anterolisthesis. Mild disc bulge with disc desiccation. Mild facet and ligament flavum hypertrophy. No significant spinal stenosis. Mild left L3 foraminal narrowing. Right neural foramen remains patent. L4-5: 7 mm anterolisthesis. Associated broad posterior pseudo disc uncovering, asymmetric to the left. Moderate facet and ligament flavum hypertrophy. Resultant mild to moderate left greater than right lateral recess stenosis. Central canal remains patent. Mild to moderate left greater than right L4 foraminal narrowing. L5-S1: Mild disc bulge with disc desiccation. Moderate right with mild left facet hypertrophy. No significant spinal stenosis.  Mild right L5 foraminal narrowing. Left neural foramina remains patent. IMPRESSION: 1. Acute compression fracture involving the superior endplate of L1 with associated 20% height loss without bony retropulsion. 2. Acute compression fracture involving the superior endplate of L2 with associated 30% height loss with trace 2 mm bony retropulsion. No significant stenosis. 3. 7 mm anterolisthesis of L4 on L5 with associated disc bulge and facet hypertrophy, resulting in mild to moderate left greater than right lateral recess and foraminal stenosis. Electronically Signed   By: Rise Mu M.D.   On: 05/06/2020 21:20   US Venous Img Lower Unilateral Right (DVT)  Result Date: 05/05/2020 CLINICAL DATA:  Right lower extremity pain and edema. EXAM: RIGHT LOWER EXTREMITY VENOUS DOPPLER ULTRASOUND TECHNIQUE: Gray-scale sonography with graded compression, as well as color Doppler and duplex ultrasound were performed to evaluate the lower extremity deep venous systems from the level of the common femoral vein and including the common femoral, femoral, profunda femoral, popliteal and calf veins including the posterior tibial, peroneal and gastrocnemius veins when visible. The superficial great saphenous vein was also interrogated. Spectral Doppler was utilized to evaluate flow at rest and with distal augmentation maneuvers in the common femoral, femoral and popliteal veins. COMPARISON:  None. FINDINGS: Contralateral Common Femoral Vein: Respiratory phasicity is normal and symmetric with the symptomatic side. No evidence of thrombus. Normal compressibility. Common Femoral Vein: No evidence of thrombus. Normal compressibility, respiratory phasicity and response to augmentation. Saphenofemoral Junction: No evidence of thrombus. Normal compressibility and flow on color Doppler imaging. Profunda Femoral Vein: No evidence of thrombus. Normal compressibility and flow on color Doppler imaging. Femoral Vein: No evidence of  thrombus. Normal compressibility, respiratory phasicity and response to augmentation. Popliteal Vein: No evidence of thrombus. Normal compressibility, respiratory phasicity and response to augmentation. Calf Veins: No evidence of thrombus. Normal compressibility and flow on color Doppler imaging. Superficial Great Saphenous Vein: No evidence of thrombus. Normal compressibility. Venous Reflux:  None. Other Findings: No evidence of superficial thrombophlebitis or abnormal fluid collection. IMPRESSION: No evidence  of right lower extremity deep venous thrombosis. Electronically Signed   By: Irish Lack M.D.   On: 05/05/2020 12:43   DG Chest Portable 1 View  Result Date: 05/05/2020 CLINICAL DATA:  Tachycardia EXAM: PORTABLE CHEST 1 VIEW COMPARISON:  04/01/2018 FINDINGS: Background interstitial prominence is again identified likely reflecting changes of underlying chronic interstitial lung disease. No superimposed confluent pulmonary infiltrates. No pneumothorax or pleural effusion. Biapical pleural scarring again noted, unchanged. Mild cardiomegaly is stable. The pulmonary vascularity is normal. No acute bone abnormality. IMPRESSION: Stable cardiomegaly. No radiographic evidence of acute cardiopulmonary disease. Electronically Signed   By: Helyn Numbers MD   On: 05/05/2020 02:28   ECHOCARDIOGRAM COMPLETE  Result Date: 05/06/2020    ECHOCARDIOGRAM REPORT   Patient Name:   Tracy Singh Date of Exam: 05/06/2020 Medical Rec #:  [id number]            Height:       64.0 in Accession #:    [id number]           Weight:       149.0 lb Date of Birth:  [birth year]            BSA:          1.726 m Patient Age:    87 years             BP:           122/48 mmHg Patient Gender: F                    HR:           104 bpm. Exam Location:  [facility] Procedure: 2D Echo, Color Doppler and Cardiac Doppler Indications:     I48.91 Atrial fibrillation  History:         Patient has no prior history of Echocardiogram examinations.                   Sepsis.  Sonographer:     Humphrey Rolls RDCS (AE) Referring Phys:  [id number] Vernetta Honey MANSY Diagnosing Phys: Marcina Millard MD  Sonographer Comments: Suboptimal apical window and suboptimal subcostal window. IMPRESSIONS  1. Left ventricular ejection fraction, by estimation, is 60 to 65%. The left ventricle has normal function. The left ventricle has no regional wall motion abnormalities. Left ventricular diastolic parameters are indeterminate.  2. Right ventricular systolic function is normal. The right ventricular size is normal.  3. The mitral valve is normal in structure. Mild to moderate mitral valve regurgitation. No evidence of mitral stenosis.  4. The aortic valve is normal in structure. Aortic valve regurgitation is not visualized. No aortic stenosis is present.  5. The inferior vena cava is normal in size with greater than 50% respiratory variability, suggesting right atrial pressure of 3 mmHg. FINDINGS  Left Ventricle: Left ventricular ejection fraction, by estimation, is 60 to 65%. The left ventricle has normal function. The left ventricle has no regional wall motion abnormalities. The left ventricular internal cavity size was normal in size. There is  no left ventricular hypertrophy. Left ventricular diastolic parameters are indeterminate. Right Ventricle: The right ventricular size is normal. No increase in right ventricular wall thickness. Right ventricular systolic function is normal. Left Atrium: Left atrial size was normal in size. Right Atrium: Right atrial size was normal in size. Pericardium: Trivial pericardial effusion is present. Mitral Valve: The mitral valve is normal in structure. Mild to moderate mitral valve regurgitation. No evidence of mitral  valve stenosis. MV peak gradient, 9.6 mmHg. The mean mitral valve gradient is 4.0 mmHg. Tricuspid Valve: The tricuspid valve is normal in structure. Tricuspid valve regurgitation is mild . No evidence of tricuspid stenosis. Aortic Valve:  The aortic valve is normal in structure. Aortic valve regurgitation is not visualized. No aortic stenosis is present. Aortic valve mean gradient measures 3.0 mmHg. Aortic valve peak gradient measures 6.2 mmHg. Aortic valve area, by VTI measures 2.06 cm. Pulmonic Valve: The pulmonic valve was normal in structure. Pulmonic valve regurgitation is not visualized. No evidence of pulmonic stenosis. Aorta: The aortic root is normal in size and structure. Venous: The inferior vena cava is normal in size with greater than 50% respiratory variability, suggesting right atrial pressure of 3 mmHg. IAS/Shunts: No atrial level shunt detected by color flow Doppler.  LEFT VENTRICLE PLAX 2D LVIDd:         4.50 cm  Diastology LVIDs:         2.90 cm  LV e' medial:    8.05 cm/s LV PW:         1.10 cm  LV E/e' medial:  16.5 LV IVS:        0.80 cm  LV e' lateral:   11.30 cm/s LVOT diam:     1.80 cm  LV E/e' lateral: 11.7 LV SV:         45 LV SV Index:   26 LVOT Area:     2.54 cm  RIGHT VENTRICLE RV Basal diam:  3.40 cm LEFT ATRIUM           Index LA diam:      4.20 cm 2.43 cm/m LA Vol (A2C): 38.2 ml 22.13 ml/m LA Vol (A4C): 91.8 ml 53.18 ml/m  AORTIC VALVE                   PULMONIC VALVE AV Area (Vmax):    2.04 cm    PV Vmax:       0.83 m/s AV Area (Vmean):   2.13 cm    PV Vmean:      55.900 cm/s AV Area (VTI):     2.06 cm    PV VTI:        0.152 m AV Vmax:           125.00 cm/s PV Peak grad:  2.8 mmHg AV Vmean:          87.400 cm/s PV Mean grad:  1.0 mmHg AV VTI:            0.217 m AV Peak Grad:      6.2 mmHg AV Mean Grad:      3.0 mmHg LVOT Vmax:         100.00 cm/s LVOT Vmean:        73.300 cm/s LVOT VTI:          0.176 m LVOT/AV VTI ratio: 0.81  AORTA Ao Root diam: 2.70 cm MITRAL VALVE                TRICUSPID VALVE MV Area (PHT): 3.97 cm     TR Peak grad:   26.4 mmHg MV Area VTI:   1.77 cm     TR Vmax:        257.00 cm/s MV Peak grad:  9.6 mmHg MV Mean grad:  4.0 mmHg     SHUNTS MV Vmax:       1.55 m/s     Systemic VTI:   0.18  m MV Vmean:      85.4 cm/s    Systemic Diam: 1.80 cm MV Decel Time: 191 msec MV E velocity: 132.50 cm/s Marcina MillardAlexander Paraschos MD Electronically signed by Marcina MillardAlexander Paraschos MD Signature Date/Time: 05/06/2020/12:09:45 PM    Final      Assessment and Recommendation  87 y.o. female with known persistent atrial fibrillation and hypertension having acute on chronic diastolic dysfunction heart failure after acute back injury now significantly improved needing further adjustments of medication management prior to discharge 1.  Continuation current dosages of medication management for resting and exercise and rehabilitation heart rate control including diltiazem 120 mg and split dose of metoprolol succinate 25 mg twice per day.  No additional dosages at this time due to heart rate control at peak activity unlikely to be significantly concerning and family concerned of potential for bradycardia at rest 2.  Continue anticoagulation for further risk reduction of stroke with atrial fibrillation without change today 3.  Furosemide orally for further risk reduction of acute on chronic diastolic dysfunction congestive heart failure 4.  If ambulating relatively well okay for discharge to rehabilitation center for further treatment options and follow-up in cardiology office thereafter  Signed, Arnoldo HookerBruce Debralee Braaksma M.D. FACC

## 2020-05-11 NOTE — TOC Progression Note (Signed)
Transition of Care [facility]) - Progression Note    Patient Details  Name: Tracy Singh MRN: [id number] Date of Birth: [birth year]  Transition of Care [facility]) CM/SW Contact  Hetty Ely, RN Phone Number: 05/11/2020, 9:48 AM  Clinical Narrative:  Spoke with daughter Allene Dillon via phone about SNF discharge plans. Daughter agrees with SNF rehabilitation, received a list of recommendations from siblings. Preference [facility]. FL2 completed and requests to local SNF's submitted. Daughter understands I will keep her posted with bed acceptance so she can decide.    Expected Discharge Plan: Skilled Nursing Facility Barriers to Discharge: Continued Medical Work up  Expected Discharge Plan and Services Expected Discharge Plan: Skilled Nursing Facility     Post Acute Care Choice: Skilled Nursing Facility Living arrangements for the past 2 months: Single Family Home                                       Social Determinants of Health (SDOH) Interventions    Readmission Risk Interventions No flowsheet data found.

## 2020-05-11 NOTE — Progress Notes (Signed)
Physical Therapy Treatment Patient Details Name: Tracy Singh MRN: [id number] DOB: 1924/11/14 Today's Date: 05/11/2020    History of Present Illness Pt is a 87 y/o F with PMH that includes Afib and Von Willebrand disease. Pt presented with chief c/o low back pain (stated started 6WA and was started on muscle relaxers by Martha Clan). Pt with increased pain starting on 4/11 and presented to ED. L-spine XR from 4/12 revealed L2 compression fracture of unknown duration. Pt recommended to wear LSO with ambulation and is currently being managed conservatively, but neuro sx consulted for potential kyphoplasty if pt fails conservative management.    PT Comments    Pt was pleasant and motivated to participate during the session and overall performed well.  Pt required only min A during log roll training and no physical assistance to come to standing from an elevated EOB.  Pt did require min A for stability upon initiating gait but then was CGA for the remainder of the walk.  Pt ambulated with a slow cadence with SpO2 remaining in the mid to upper 90s, occasionally 100%, and HR that ranged primarily from the 90s to the 110's.  Pt reported no adverse symptoms during the session.  Pt will benefit from PT services in a SNF setting upon discharge to safely address deficits listed in patient problem list for decreased caregiver assistance and eventual return to PLOF.     Follow Up Recommendations  SNF;Supervision/Assistance - 24 hour     Equipment Recommendations  Other (comment) (TBD at next venue of care)    Recommendations for Other Services       Precautions / Restrictions Precautions Precautions: Back;Fall Required Braces or Orthoses: Spinal Brace Spinal Brace: Thoracolumbosacral orthotic;Applied in sitting position Restrictions Weight Bearing Restrictions: No Other Position/Activity Restrictions: Per OT eval pt requires lumbar brace for ambulation but able to sit EOB w/o.    Mobility   Bed Mobility Overal bed mobility: Needs Assistance Bed Mobility: Sidelying to Sit;Rolling Rolling: Min assist Sidelying to sit: Min assist       General bed mobility comments: Pt put forth very good effort during log roll training with only min A required for BLE and trunk support    Transfers Overall transfer level: Needs assistance Equipment used: Rolling walker (2 wheeled) Transfers: Sit to/from Stand Sit to Stand: Min guard         General transfer comment: Min to mod verbal and tactile cues for sequencing  Ambulation/Gait Ambulation/Gait assistance: Min guard;Min assist Gait Distance (Feet): 40 Feet Assistive device: Rolling walker (2 wheeled) Gait Pattern/deviations: Step-through pattern;Decreased step length - right;Decreased step length - left Gait velocity: decreased   General Gait Details: Slow cadence with min A for stability on only one occasion; min to mod verbal cues for deeper breaths with SpO2 remaining in the mid to upper 90s throughout and with HR ranging from the 90s to 110's with no adverse symptoms noted   Stairs             Wheelchair Mobility    Modified Rankin (Stroke Patients Only)       Balance Overall balance assessment: Needs assistance Sitting-balance support: Feet unsupported;Single extremity supported Sitting balance-Leahy Scale: Good     Standing balance support: Bilateral upper extremity supported;During functional activity Standing balance-Leahy Scale: Poor Standing balance comment: Min A on one occasion this session for stability  Cognition Arousal/Alertness: Awake/alert Behavior During Therapy: WFL for tasks assessed/performed Overall Cognitive Status: Within Functional Limits for tasks assessed                                 General Comments: HOH      Exercises Other Exercises Other Exercises: Donning clam shell brace Other Exercises: Log roll training/review  with rolling and sidelying to/from sit    General Comments        Pertinent Vitals/Pain Pain Assessment: No/denies pain    Home Living                      Prior Function            PT Goals (current goals can now be found in the care plan section) Progress towards PT goals: Progressing toward goals    Frequency    7X/week      PT Plan Current plan remains appropriate    Co-evaluation              AM-PAC PT "6 Clicks" Mobility   Outcome Measure  Help needed turning from your back to your side while in a flat bed without using bedrails?: A Little Help needed moving from lying on your back to sitting on the side of a flat bed without using bedrails?: A Little Help needed moving to and from a bed to a chair (including a wheelchair)?: A Little Help needed standing up from a chair using your arms (e.g., wheelchair or bedside chair)?: A Little Help needed to walk in hospital room?: A Little Help needed climbing 3-5 steps with a railing? : A Lot 6 Click Score: 17    End of Session Equipment Utilized During Treatment: Back brace;Gait belt Activity Tolerance: Patient tolerated treatment well Patient left: in chair;with chair alarm set;with call bell/phone within reach;with family/visitor present Nurse Communication: Mobility status PT Visit Diagnosis: Difficulty in walking, not elsewhere classified (R26.2);Muscle weakness (generalized) (M62.81);Pain Pain - part of body:  (low back)     Time: 1451-1520 PT Time Calculation (min) (ACUTE ONLY): 29 min  Charges:  $Gait Training: 8-22 mins $Therapeutic Activity: 8-22 mins                     D. Scott Alexxis Mackert PT, DPT 05/11/20, 4:30 PM

## 2020-05-11 NOTE — NC FL2 (Signed)
MEDICAID FL2 LEVEL OF CARE SCREENING TOOL     IDENTIFICATION  Patient Name: Tracy Singh Birthdate: 28-Feb-1924 Sex: female Admission Date (Current Location): 05/05/2020  [place] and [place] Number:  Chiropodist and Address:  [facility], [address] [place], [place] [address]      Provider Number: [id number]  Attending Physician Name and Address:  Lynn Ito, MD  Relative Name and Phone Number:  Allene Dillon [phone]    Current Level of Care: Hospital Recommended Level of Care: Skilled Nursing Facility Prior Approval Number:    Date Approved/Denied:   PASRR Number: [id number]  Discharge Plan: SNF    Current Diagnoses: Patient Active Problem List   Diagnosis Date Noted  . Atrial fibrillation with rapid ventricular response (HCC) 05/05/2020  . Diarrhea, functional 05/15/2018  . Atrial fibrillation, chronic (HCC) 03/10/2015  . Essential hypertension 10/06/2013  . Von Willebrand disease (HCC) 10/06/2013    Orientation RESPIRATION BLADDER Height & Weight     Self,Time  Normal External catheter Weight: 59.8 kg Height:  5\' 4"  (162.6 cm)  BEHAVIORAL SYMPTOMS/MOOD NEUROLOGICAL BOWEL NUTRITION STATUS      Continent Diet  AMBULATORY STATUS COMMUNICATION OF NEEDS Skin   Limited Assist Verbally                         Personal Care Assistance Level of Assistance  Bathing,Feeding,Dressing Bathing Assistance: Limited assistance Feeding assistance: Limited assistance       Functional Limitations Info  Sight,Hearing,Speech Sight Info: Adequate Hearing Info: Impaired (hard of hearing) Speech Info: Adequate    SPECIAL CARE FACTORS FREQUENCY  PT (By licensed PT),OT (By licensed OT)     PT Frequency: 5x week OT Frequency: 5x week            Contractures Contractures Info: Not present    Additional Factors Info  Code Status,Allergies Code Status Info: Full Allergies Info: Red Dye, Yellow  Dyes           Current Medications (05/11/2020):  This is the current hospital active medication list Current Facility-Administered Medications  Medication Dose Route Frequency Provider Last Rate Last Admin  . acetaminophen (TYLENOL) tablet 650 mg  650 mg Oral Q6H PRN Mansy, Jan A, MD       Or  . acetaminophen (TYLENOL) suppository 650 mg  650 mg Rectal Q6H PRN Mansy, Jan A, MD      . dabigatran (PRADAXA) capsule 150 mg  150 mg Oral Q12H Mansy, Jan A, MD   150 mg at 05/11/20 0919  . diltiazem (CARDIZEM CD) 24 hr capsule 120 mg  120 mg Oral Daily 05/13/20, PA-C   120 mg at 05/10/20 2136  . furosemide (LASIX) tablet 20 mg  20 mg Oral Daily 2137, PA-C   20 mg at 05/11/20 0919  . lidocaine (LIDODERM) 5 % 1 patch  1 patch Transdermal Q24H 05/13/20, MD   1 patch at 05/10/20 1033  . loperamide (IMODIUM) capsule 2 mg  2 mg Oral PRN Mansy, Jan A, MD      . magnesium hydroxide (MILK OF MAGNESIA) suspension 30 mL  30 mL Oral Daily PRN Mansy, Jan A, MD      . metoprolol succinate (TOPROL-XL) 24 hr tablet 25 mg  25 mg Oral BID Feb, RPH   25 mg at 05/11/20 0919  . morphine 2 MG/ML injection 2 mg  2 mg Intravenous Q4H PRN 05/13/20,  MD   2 mg at 05/10/20 0332  . multivitamin with minerals tablet 1 tablet  1 tablet Oral Daily Mansy, Jan A, MD   1 tablet at 05/11/20 0919  . ondansetron (ZOFRAN) tablet 4 mg  4 mg Oral Q6H PRN Mansy, Jan A, MD       Or  . ondansetron [facility]) injection 4 mg  4 mg Intravenous Q6H PRN Mansy, Jan A, MD   4 mg at 05/09/20 1925  . traZODone (DESYREL) tablet 25 mg  25 mg Oral QHS PRN Mansy, Jan A, MD   25 mg at 05/10/20 2136     Discharge Medications: Please see discharge summary for a list of discharge medications.  Relevant Imaging Results:  Relevant Lab Results:   Additional Information [id number]  Hetty Ely, RN

## 2020-05-12 NOTE — Progress Notes (Signed)
PROGRESS NOTE    DER GAGLIANO  AJO:[id number] DOB: December 09, 1924 DOA: 05/05/2020 PCP: Reubin Milan, MD    Brief Narrative:  Tracy Singh is a 87 y.o. Caucasian female with medical history significant for atrial fibrillation on Pradaxa and von Willebrand disease, who presented to the emergency room with acute onset of low back pain for which she was recently seen by Dr. Martha Clan and prescribed Robaxin with mild help.  She has been having urinary frequency and urgency and felt to have a UTI.  She was seen by her cardiologist and given p.o. Cipro. She does have bilateral lower extremity edema.  Her pulse extremities dropped to 85% on room air in the ER and she was placed on 2 L of O2 by nasal cannula.ED Course: Upon presentation to the emergency room blood pressure was 186/152 with a heart rate of 137 and she was in rapid atrial fibrillation.  Labs revealed mild hyponatremia.BNP was 724.5 and high-sensitivity troponin I was 11 and later 9.  Influenza antigens and COVID-19 PCR came back negative   EKG was atrial fibrillation with RVR.  Lumbar spine x-ray showed L2 superior endplate fracture with approximately 30 to 40% loss of height and moderate degenerative disc disease at L4-L5 with cystic grade 1 anterolisthesis as well as cholelithiasis.  T-spine x-ray showed no acute abnormalities.   4/14- awaiting brace. 4/15-PT rec. SNF. Brace here. Pt with no new complaints. No overnight issues.  On telemetry with physical therapy heart rate increased to the 120s.  Given extra dose of Toprol-XL 25. 4/16-heart rate from 102 to 110 at rest in bed.  Patient denies any dizziness, chest pain or shortness of breath 4/17- while I was in room pt working with PT, as she got up HR 130's in afib. NSg reported pt's daughter did not want her to get beta blk at scheduled am time but to be given at 12 PM as she takes it at home.  Also found out had ordered Toprol-XL twice daily but daughter had questions now  and wanted it once a day. 4/18- HR at rest 80's.  4/19-no overnight issues. No complaints  Consultants:   Cardiology, neurosurgery  Procedures:   Antimicrobials:       Subjective: Denies shortness of breath, chest pain, nausea or vomiting, dizziness  Objective: Vitals:   05/11/20 2020 05/12/20 0000 05/12/20 0400 05/12/20 0802  BP: 133/69 (!) 132/59 112/73 104/81  Pulse: 76 76 71 80  Resp: (!) 21  18 20   Temp: 98.3 F (36.8 C)  98.3 F (36.8 C) 98.2 F (36.8 C)  TempSrc: Oral  Oral Oral  SpO2: 98% 98% 99% 99%  Weight:      Height:        Intake/Output Summary (Last 24 hours) at 05/12/2020 0902 Last data filed at 05/12/2020 0446 Gross per 24 hour  Intake 1230 ml  Output 2000 ml  Net -770 ml   Filed Weights   05/07/20 0517 05/10/20 0500 05/11/20 0917  Weight: 61.7 kg 59.8 kg 61 kg    Examination: NAD, laying in bed, CTA no wheeze rales rhonchi's Regular S1-S2 no gallops Soft benign positive bowel sounds No edema Awake alert and oriented, grossly intact Mood and affect appropriate in current setting   Data Reviewed: I have personally reviewed following labs and imaging studies  CBC: Recent Labs  Lab 05/08/20 0720  WBC 8.4  HGB 15.8*  HCT 45.6  MCV 90.1  PLT 288   Basic Metabolic Panel: Recent Labs  Lab 05/06/20 0556 05/07/20 0613 05/08/20 0720 05/09/20 0524 05/10/20 0532  NA 132* 132* 128* 132* 132*  K 3.8 3.5 3.9 3.7 3.7  CL 91* 91* 86* 90* 90*  CO2 30 32 30 31 30   GLUCOSE 110* 113* 130* 116* 102*  BUN 14 15 15 15 16   CREATININE 0.85 0.78 0.82 0.80 0.66  CALCIUM 9.3 8.8* 9.0 8.9 9.0   GFR: Estimated Creatinine Clearance: 37.9 mL/min (by C-G formula based on SCr of 0.66 mg/dL). Liver Function Tests: No results for input(s): AST, ALT, ALKPHOS, BILITOT, PROT, ALBUMIN in the last 168 hours. No results for input(s): LIPASE, AMYLASE in the last 168 hours. No results for input(s): AMMONIA in the last 168 hours. Coagulation Profile: No  results for input(s): INR, PROTIME in the last 168 hours. Cardiac Enzymes: No results for input(s): CKTOTAL, CKMB, CKMBINDEX, TROPONINI in the last 168 hours. BNP (last 3 results) No results for input(s): PROBNP in the last 8760 hours. HbA1C: No results for input(s): HGBA1C in the last 72 hours. CBG: No results for input(s): GLUCAP in the last 168 hours. Lipid Profile: No results for input(s): CHOL, HDL, LDLCALC, TRIG, CHOLHDL, LDLDIRECT in the last 72 hours. Thyroid Function Tests: No results for input(s): TSH, T4TOTAL, FREET4, T3FREE, THYROIDAB in the last 72 hours. Anemia Panel: No results for input(s): VITAMINB12, FOLATE, FERRITIN, TIBC, IRON, RETICCTPCT in the last 72 hours. Sepsis Labs: No results for input(s): PROCALCITON, LATICACIDVEN in the last 168 hours.  Recent Results (from the past 240 hour(s))  Resp Panel by RT-PCR (Flu A&B, Covid) Nasopharyngeal Swab     Status: None   Collection Time: 05/05/20  1:26 AM   Specimen: Nasopharyngeal Swab; Nasopharyngeal(NP) swabs in vial transport medium  Result Value Ref Range Status   SARS Coronavirus 2 by RT PCR NEGATIVE NEGATIVE Final    Comment: (NOTE) SARS-CoV-2 target nucleic acids are NOT DETECTED.  The SARS-CoV-2 RNA is generally detectable in upper respiratory specimens during the acute phase of infection. The lowest concentration of SARS-CoV-2 viral copies this assay can detect is 138 copies/mL. A negative result does not preclude SARS-Cov-2 infection and should not be used as the sole basis for treatment or other patient management decisions. A negative result may occur with  improper specimen collection/handling, submission of specimen other than nasopharyngeal swab, presence of viral mutation(s) within the areas targeted by this assay, and inadequate number of viral copies(<138 copies/mL). A negative result must be combined with clinical observations, patient history, and epidemiological information. The expected  result is Negative.  Fact Sheet for Patients:   Fact Sheet for Healthcare Providers:  07/05/20  This test is no t yet approved or cleared by the [employer] FDA and  has been authorized for detection and/or diagnosis of SARS-CoV-2 by FDA under an Emergency Use Authorization (EUA). This EUA will remain  in effect (meaning this test can be used) for the duration of the COVID-19 declaration under Section 564(b)(1) of the Act, 21 U.S.C.section 360bbb-3(b)(1), unless the authorization is terminated  or revoked sooner.       Influenza A by PCR NEGATIVE NEGATIVE Final   Influenza B by PCR NEGATIVE NEGATIVE Final    Comment: (NOTE) The Xpert Xpress SARS-CoV-2/FLU/RSV plus assay is intended as an aid in the diagnosis of influenza from Nasopharyngeal swab specimens and should not be used as a sole basis for treatment. Nasal washings and aspirates are unacceptable for Xpert Xpress SARS-CoV-2/FLU/RSV testing.  Fact Sheet for Patients: SeriousBroker.it  Fact Sheet for  Healthcare Providers: SeriousBroker.ithttps://www.fda.gov/media/152162/download  This test is not yet approved or cleared by the [place] FDA and has been authorized for detection and/or diagnosis of SARS-CoV-2 by FDA under an Emergency Use Authorization (EUA). This EUA will remain in effect (meaning this test can be used) for the duration of the COVID-19 declaration under Section 564(b)(1) of the Act, 21 U.S.C. section 360bbb-3(b)(1), unless the authorization is terminated or revoked.  Performed at [facility], [address] [place], [place] [address]          Radiology Studies: No results found.      Scheduled Meds: . dabigatran  150 mg Oral Q12H  . diltiazem  120 mg Oral Daily  . furosemide  20 mg Oral Daily  . lidocaine  1 patch Transdermal Q24H  . metoprolol succinate  25 mg Oral BID  .  multivitamin with minerals  1 tablet Oral Daily   Continuous Infusions:  Assessment & Plan:   Active Problems:   Atrial fibrillation with rapid ventricular response (HCC)   1.  Atrial fibrillation with rapid ventricular response. Was on cardizem gtt, switched to cardizem CD 120mg  qd Echo normal EF  Cardiology was following  Toprol was decreased to 25 mg daily from twice daily by cardiology however patient heart rate is elevated and cannot ambulate with PT.  Will go back to her original dose of 50 but will divide to Toprol-XL 25 mg twice daily  4/17- HR not controlled at times with ambulation as I found out daughter not wanting to give beta blk bid, until I explained to her why I was giving it . Also wanted to beta-blockers to be given later in the day and I also discussed this with them again.  Daughter now agreeable to twice daily dosing and to be given as scheduled time. Will keep Toprol-XL 25 twice daily and not increase at this time since she did not receive the medication at twice daily dosing. 4/19-d/w cardiology about her tachycardia during ambulation, they were okay and family does not want any further medication adjustments or if she has tachy-bradycardia to get a pacemaker.  At this time we will continue on current regimen of Toprol-XL 25 mg twice daily and Cardizem. Continue Pradaxa Follow-up with cardiology as outpatient for further management     2.Acute on chronic DHF- more euvolemic on exam Improved  Continue A. fib control  Continue beta-blockers and Lasix  I's and O's        3.  L2 compression fracture possibly pathological secondary to osteoporosis. Continue with pain management  Neurosurgery consulted, input was appreciated, will obtain L spine MRI Consider kyphoplasty if she cannot ambulate with LSBo 4/19-LSO  when out of bed  SNF bed pending Continue PT  neurosurgery will consider kyphoplasty if she cannot ambulate with LSO Follow-up with neurosurgery  as outpatient        4.  Acute respiratory failure with hypoxia, likely secondary to acute DHF. Improved Continue incentive spirometer Keep O2 sat above 92%   5.  Hypertensive urgency, likely contributing to her acute CHF. improved Continue to monitor   6.  Recent UTI status post treatment with Cipro. -Urinalysis currently negative.  She finished taking Cipro.    DVT prophylaxis: Pradaxa Code Status: Full Family Communication: Daughter at bedside  Status is: Inpatient  Remains inpatient appropriate because: Safe discharge  Dispo: The patient is from: Home              Anticipated d/c is to:  SNF-bed pending              Patient currently is medically stable   Difficult to place patient No            LOS: 7 days   Time spent: 35 minutes with more than 50% on COC    Lynn Ito, MD [facility] Pager [phone]  If 7PM-7AM, please contact night-coverage 05/12/2020, 9:02 AM

## 2020-05-12 NOTE — Progress Notes (Addendum)
Occupational Therapy Treatment Patient Details Name: Tracy Singh MRN: [id number] DOB: November 15, 1924 Today's Date: 05/12/2020    History of present illness Pt is a 87 y/o F with PMH that includes Afib and Von Willebrand disease. Pt presented with chief c/o low back pain (stated started 6WA and was started on muscle relaxers by Martha Clan). Pt with increased pain starting on 4/11 and presented to ED. L-spine XR from 4/12 revealed L2 compression fracture of unknown duration. Pt recommended to wear LSO with ambulation and is currently being managed conservatively, but neuro sx consulted for potential kyphoplasty if pt fails conservative management.   OT comments  Pt seen for OT treatment on this date. Upon arrival to room, pt awake and seated upright in bed with daughter present. Pt A&Ox4, reporting no pain, and agreeable to tx. At start of session, OT reviewed back precautions, log roll technique for bed mobility, and how to don clam shell brace, with pt's daughter demonstrating understanding however pt requiring re-education and MIN verbal cues to implement. Pt is making good progress towards goals, and requires MIN A for bed mobility, MAX A to don brace while seated EOB, MIN GUARD for stand pivot transfers with RW, and MAX A for sit>stand posterior peri-care. Pt was able to stand for ~25mins this session, with pt's HR and SpO2 WNL. Pt c/o of dizziness in sitting, however reporting that dizziness subsided following UE/LE exercise. Pt continues to benefit from skilled OT services to maximize return to PLOF and minimize risk of future falls, injury, caregiver burden, and readmission. Will continue to follow POC. Discharge recommendation remains appropriate.    Follow Up Recommendations  SNF    Equipment Recommendations  3 in 1 bedside commode;Tub/shower seat       Precautions / Restrictions Precautions Precautions: Back;Fall Required Braces or Orthoses: Spinal Brace Spinal Brace:  Thoracolumbosacral orthotic;Applied in sitting position Restrictions Other Position/Activity Restrictions: Lumbar brace for ambulation but able to sit EOB w/o.       Mobility Bed Mobility Overal bed mobility: Needs Assistance Bed Mobility: Rolling;Sidelying to Sit Rolling: Min guard Sidelying to sit: Min assist       General bed mobility comments: Pt put forth very good effort during log roll training with only min A required for trunk support    Transfers Overall transfer level: Needs assistance Equipment used: Rolling walker (2 wheeled) Transfers: Sit to/from Stand Sit to Stand: Min assist         General transfer comment: verbal cues for safe hand placement during transfer with RW    Balance Overall balance assessment: Needs assistance Sitting-balance support: No upper extremity supported;Feet supported Sitting balance-Leahy Scale: Good Sitting balance - Comments: Good sitting balance at EOB to don TLSO   Standing balance support: Bilateral upper extremity supported;During functional activity Standing balance-Leahy Scale: Fair Standing balance comment: MIN GUARD for stand pivot transfer bed>chair                           ADL either performed or assessed with clinical judgement   ADL Overall ADL's : Needs assistance/impaired                             Toileting- Clothing Manipulation and Hygiene: Maximal assistance;Sit to/from stand Toileting - Clothing Manipulation Details (indicate cue type and reason): For posterior peri-care     Functional mobility during ADLs: Min guard;Rolling walker  Cognition Arousal/Alertness: Awake/alert Behavior During Therapy: WFL for tasks assessed/performed Overall Cognitive Status: Within Functional Limits for tasks assessed                                 General Comments: Pt A&Ox4.        Exercises Other Exercises Other Exercises: Re-education on log roll  technique and how to don clam shell brace           Pertinent Vitals/ Pain       Pain Assessment: No/denies pain         Frequency  Min 2X/week        Progress Toward Goals  OT Goals(current goals can now be found in the care plan section)  Progress towards OT goals: Progressing toward goals  Acute Rehab OT Goals Patient Stated Goal: return home once able OT Goal Formulation: With patient Time For Goal Achievement: 05/21/20 Potential to Achieve Goals: Good  Plan Discharge plan remains appropriate;Frequency remains appropriate       AM-PAC OT "6 Clicks" Daily Activity     Outcome Measure   Help from another person eating meals?: None Help from another person taking care of personal grooming?: A Little Help from another person toileting, which includes using toliet, bedpan, or urinal?: A Lot Help from another person bathing (including washing, rinsing, drying)?: A Lot Help from another person to put on and taking off regular upper body clothing?: A Little Help from another person to put on and taking off regular lower body clothing?: A Lot 6 Click Score: 16    End of Session Equipment Utilized During Treatment: Gait belt;Rolling walker;Back brace  OT Visit Diagnosis: Unsteadiness on feet (R26.81);Muscle weakness (generalized) (M62.81)   Activity Tolerance Patient tolerated treatment well   Patient Left in chair;with call bell/phone within reach;with chair alarm set;with family/visitor present   Nurse Communication Mobility status        Time: 1112-1150 OT Time Calculation (min): 38 min  Charges: OT General Charges $OT Visit: 1 Visit OT Treatments $Self Care/Home Management : 38-52 mins  Matthew Folks, OTR/L [facility] [phone]

## 2020-05-12 NOTE — Progress Notes (Signed)
[facility] Cardiology [facility] Encounter Note  Patient: Tracy Singh / Admit Date: 05/05/2020 / Date of Encounter: 05/12/2020, 7:12 AM   Subjective: 4/18.  Patient overall feels quite well today and slept well last night.  No evidence of significant major pain from her back.  No ambulation at this time the which could cause her to have further significant symptoms.  Patient has had variable heart rate control especially with activities and pain.  Addition of evening dose of Toprol-XL appears to have helped with heart rate control and will continue this dosage at this time for need in controlling ambulatory heart rate.  No current evidence of angina or congestive heart failure  4/19.  Patient has not complained of any significant cardiovascular symptoms overnight.  Heart rate control of atrial fibrillation is still quite good over the last 24 to 48 hours with combination of diltiazem metoprolol patient is also tolerate Pradaxa.  We will continue furosemide  Review of Systems: Positive for: None Negative for: Vision change, hearing change, syncope, dizziness, nausea, vomiting,diarrhea, bloody stool, stomach pain, cough, congestion, diaphoresis, urinary frequency, urinary pain,skin lesions, skin rashes Others previously listed  Objective: Telemetry: Atrial fibrillation with controlled ventricular rate Physical Exam: Blood pressure 112/73, pulse 71, temperature 98.3 F (36.8 C), temperature source Oral, resp. rate 18, height 5\' 4"  (1.626 m), weight 61 kg, SpO2 99 %. Body mass index is 23.09 kg/m. General: Well developed, well nourished, in no acute distress. Head: Normocephalic, atraumatic, sclera non-icteric, no xanthomas, nares are without discharge. Neck: No apparent masses Lungs: Normal respirations with no wheezes, no rhonchi, no rales , no crackles   Heart: Irregular rate and rhythm, normal S1 S2, no murmur, no rub, no gallop, PMI is normal size and placement, carotid upstroke  normal without bruit, jugular venous pressure normal Abdomen: Soft, non-tender, non-distended with normoactive bowel sounds. No hepatosplenomegaly. Abdominal aorta is normal size without bruit Extremities: No edema, no clubbing, no cyanosis, no ulcers,  Peripheral: 2+ radial, 2+ femoral, 2+ dorsal pedal pulses Neuro: Alert and oriented. Moves all extremities spontaneously. Psych:  Responds to questions appropriately with a normal affect.   Intake/Output Summary (Last 24 hours) at 05/12/2020 0712 Last data filed at 05/12/2020 0446 Gross per 24 hour  Intake 1230 ml  Output 2000 ml  Net -770 ml    Inpatient Medications:  . dabigatran  150 mg Oral Q12H  . diltiazem  120 mg Oral Daily  . furosemide  20 mg Oral Daily  . lidocaine  1 patch Transdermal Q24H  . metoprolol succinate  25 mg Oral BID  . multivitamin with minerals  1 tablet Oral Daily   Infusions:   Labs: Recent Labs    05/10/20 0532  NA 132*  K 3.7  CL 90*  CO2 30  GLUCOSE 102*  BUN 16  CREATININE 0.66  CALCIUM 9.0   No results for input(s): AST, ALT, ALKPHOS, BILITOT, PROT, ALBUMIN in the last 72 hours. No results for input(s): WBC, NEUTROABS, HGB, HCT, MCV, PLT in the last 72 hours. No results for input(s): CKTOTAL, CKMB, TROPONINI in the last 72 hours. Invalid input(s): POCBNP No results for input(s): HGBA1C in the last 72 hours.   Weights: Filed Weights   05/07/20 0517 05/10/20 0500 05/11/20 0917  Weight: 61.7 kg 59.8 kg 61 kg     Radiology/Studies:  DG Thoracic Spine 2 View  Result Date: 05/05/2020 CLINICAL DATA:  Back pain EXAM: THORACIC SPINE 2 VIEWS COMPARISON:  None. FINDINGS: The osseous structures are  diffusely osteopenic. Normal thoracic kyphosis. No listhesis. No acute fracture of the thoracic spine. Vertebral body height appears preserved. The paraspinal soft tissues are unremarkable. L2 superior endplate fracture again noted. IMPRESSION: No definite acute fracture or listhesis of the thoracic  spine. Electronically Signed   By: Helyn Numbers MD   On: 05/05/2020 02:34   DG Lumbar Spine Complete  Result Date: 05/05/2020 CLINICAL DATA:  Back pain EXAM: LUMBAR SPINE - COMPLETE 4+ VIEW COMPARISON:  None. FINDINGS: Five view radiograph lumbar spine demonstrates normal lumbar lordosis. There is grade 1 anterolisthesis of L4 upon L5. Superior endplate fracture of L2 is noted, age indeterminate, with approximately 30-40% loss of height. This appears new since prior chest radiograph of 04/01/2018. Remaining vertebral body height has been preserved. There is intervertebral disc space narrowing at L4-5 in keeping with changes of moderate degenerative disc disease at this level. Remaining intervertebral disc heights are preserved. The paraspinal soft tissues are unremarkable. Vascular calcifications noted within the abdominal aorta. Cholelithiasis is incidentally noted. IMPRESSION: Age-indeterminate L2 superior endplate fracture with approximately 30-40% loss of height. Moderate degenerative disc disease L4-5 with cystic grade 1 anterolisthesis. Cholelithiasis Electronically Signed   By: Helyn Numbers MD   On: 05/05/2020 02:31   CT Angio Chest PE W and/or Wo Contrast  Result Date: 05/05/2020 CLINICAL DATA:  87 year old female with low back pain and hypoxia. EXAM: CT ANGIOGRAPHY CHEST WITH CONTRAST TECHNIQUE: Multidetector CT imaging of the chest was performed using the standard protocol during bolus administration of intravenous contrast. Multiplanar CT image reconstructions and MIPs were obtained to evaluate the vascular anatomy. CONTRAST:  Seventy-five mL Omnipaque 350, intravenous COMPARISON:  01/25/2010 FINDINGS: Cardiovascular: Satisfactory opacification of the pulmonary arteries to the segmental level. No evidence of pulmonary embolism. Severe biatrial cardiomegaly, advanced from 2012 comparison. No pericardial effusion. Aortic atherosclerotic calcifications. Coronary atherosclerotic calcifications.  Mediastinum/Nodes: No enlarged mediastinal, hilar, or axillary lymph nodes. Thyroid gland, trachea, and esophagus demonstrate no significant findings. Lungs/Pleura: Bibasilar subsegmental atelectasis. Trace bilateral pleural effusions versus dependent pleural thickening. Mild centrilobular emphysema. Similar appearing apical scarring with calcification. No suspicious pulmonary nodules. No pneumothorax. Upper Abdomen: No acute abnormality. Similar appearing multifocal simple hepatic cysts. Similar appearing simple cyst arising from the medial cortex of the left upper pole kidney. Small hiatal hernia. Musculoskeletal: No acute fracture. No aggressive appearing osseous lesion. Diffuse osteopenia. Review of the MIP images confirms the above findings. IMPRESSION: Vascular: 1. No evidence of pulmonary embolism. 2. Severe biatrial cardiomegaly. 3. Coronary and aortic atherosclerosis (ICD10-I70.0). Non-Vascular: Bibasilar subsegmental atelectasis. Marliss Coots, MD [facility] Radiology Electronically Signed   By: Marliss Coots MD   On: 05/05/2020 07:57   MR LUMBAR SPINE WO CONTRAST  Result Date: 05/06/2020 CLINICAL DATA:  Initial evaluation for acute trauma, back pain. EXAM: MRI LUMBAR SPINE WITHOUT CONTRAST TECHNIQUE: Multiplanar, multisequence MR imaging of the lumbar spine was performed. No intravenous contrast was administered. COMPARISON:  Prior radiograph from 05/05/2020 FINDINGS: Segmentation: Standard. Lowest well-formed disc space labeled the L5-S1 level. Alignment: Trace 2 mm anterolisthesis of L3 on L4, with 7 mm anterolisthesis of L4 on L5. Findings chronic and facet mediated. Alignment otherwise normal with preservation of the normal lumbar lordosis. Vertebrae: Acute compression fracture involving the superior endplate of L1. Associated central height loss of up to 20% without bony retropulsion. Additional acute compression fracture involving the superior  endplate of L2. Associated 30% height loss with trace 2 mm bony retropulsion. These fractures are benign/mechanical in appearance. Otherwise,  vertebral body height maintained with no other acute or chronic fracture. Underlying bone marrow signal intensity within normal limits. No discrete or worrisome osseous lesions. Mild reactive marrow edema noted about the right L4-5 facet due to facet arthritis. No other abnormal marrow edema. Conus medullaris and cauda equina: Conus extends to the L1-2 level. Conus and cauda equina appear normal. Paraspinal and other soft tissues: Mild paraspinous edema adjacent to the L1 and L2 compression fractures. Mild edema noted within the lower posterior paraspinous musculature as well, likely reflecting a degree of associated muscular injury/strain. Few benign appearing cyst noted within the kidneys bilaterally. Visualized visceral structures otherwise unremarkable. Disc levels: T12-L1: Mild disc bulge with disc desiccation. Superimposed small right foraminal to extraforaminal disc protrusion (series 8, image 3). No significant spinal stenosis. Foramina remain patent. L1-2: Mild disc bulge with disc desiccation. Trace 2 mm bony retropulsion related to the L2 compression fracture. Mild facet hypertrophy. No significant spinal stenosis. Foramina remain patent. L2-3: Diffuse disc bulge with disc desiccation. Mild facet and ligament flavum hypertrophy. No more than mild narrowing of the lateral recesses bilaterally. Central canal remains patent. No significant foraminal stenosis. L3-4: Trace anterolisthesis. Mild disc bulge with disc desiccation. Mild facet and ligament flavum hypertrophy. No significant spinal stenosis. Mild left L3 foraminal narrowing. Right neural foramen remains patent. L4-5: 7 mm anterolisthesis. Associated broad posterior pseudo disc uncovering, asymmetric to the left. Moderate facet and ligament flavum hypertrophy. Resultant mild to moderate left greater than right  lateral recess stenosis. Central canal remains patent. Mild to moderate left greater than right L4 foraminal narrowing. L5-S1: Mild disc bulge with disc desiccation. Moderate right with mild left facet hypertrophy. No significant spinal stenosis. Mild right L5 foraminal narrowing. Left neural foramina remains patent. IMPRESSION: 1. Acute compression fracture involving the superior endplate of L1 with associated 20% height loss without bony retropulsion. 2. Acute compression fracture involving the superior endplate of L2 with associated 30% height loss with trace 2 mm bony retropulsion. No significant stenosis. 3. 7 mm anterolisthesis of L4 on L5 with associated disc bulge and facet hypertrophy, resulting in mild to moderate left greater than right lateral recess and foraminal stenosis. Electronically Signed   By: Rise Mu M.D.   On: 05/06/2020 21:20   US Venous Img Lower Unilateral Right (DVT)  Result Date: 05/05/2020 CLINICAL DATA:  Right lower extremity pain and edema. EXAM: RIGHT LOWER EXTREMITY VENOUS DOPPLER ULTRASOUND TECHNIQUE: Gray-scale sonography with graded compression, as well as color Doppler and duplex ultrasound were performed to evaluate the lower extremity deep venous systems from the level of the common femoral vein and including the common femoral, femoral, profunda femoral, popliteal and calf veins including the posterior tibial, peroneal and gastrocnemius veins when visible. The superficial great saphenous vein was also interrogated. Spectral Doppler was utilized to evaluate flow at rest and with distal augmentation maneuvers in the common femoral, femoral and popliteal veins. COMPARISON:  None. FINDINGS: Contralateral Common Femoral Vein: Respiratory phasicity is normal and symmetric with the symptomatic side. No evidence of thrombus. Normal compressibility. Common Femoral Vein: No evidence of thrombus. Normal compressibility, respiratory phasicity and response to augmentation.  Saphenofemoral Junction: No evidence of thrombus. Normal compressibility and flow on color Doppler imaging. Profunda Femoral Vein: No evidence of thrombus. Normal compressibility and flow on color Doppler imaging. Femoral Vein: No evidence of thrombus. Normal compressibility, respiratory phasicity and response to augmentation. Popliteal Vein: No evidence of thrombus. Normal compressibility, respiratory phasicity and response to augmentation. Calf Veins: No evidence  of thrombus. Normal compressibility and flow on color Doppler imaging. Superficial Great Saphenous Vein: No evidence of thrombus. Normal compressibility. Venous Reflux:  None. Other Findings: No evidence of superficial thrombophlebitis or abnormal fluid collection. IMPRESSION: No evidence of right lower extremity deep venous thrombosis. Electronically Signed   By: Irish Lack M.D.   On: 05/05/2020 12:43   DG Chest Portable 1 View  Result Date: 05/05/2020 CLINICAL DATA:  Tachycardia EXAM: PORTABLE CHEST 1 VIEW COMPARISON:  04/01/2018 FINDINGS: Background interstitial prominence is again identified likely reflecting changes of underlying chronic interstitial lung disease. No superimposed confluent pulmonary infiltrates. No pneumothorax or pleural effusion. Biapical pleural scarring again noted, unchanged. Mild cardiomegaly is stable. The pulmonary vascularity is normal. No acute bone abnormality. IMPRESSION: Stable cardiomegaly. No radiographic evidence of acute cardiopulmonary disease. Electronically Signed   By: Helyn Numbers MD   On: 05/05/2020 02:28   ECHOCARDIOGRAM COMPLETE  Result Date: 05/06/2020    ECHOCARDIOGRAM REPORT   Patient Name:   MEKHI LASCOLA Date of Exam: 05/06/2020 Medical Rec #:  [id number]            Height:       64.0 in Accession #:    [id number]           Weight:       149.0 lb Date of Birth:  1924-02-19            BSA:          1.726 m Patient Age:    89 years             BP:           122/48 mmHg Patient Gender: F                     HR:           104 bpm. Exam Location:  [facility] Procedure: 2D Echo, Color Doppler and Cardiac Doppler Indications:     I48.91 Atrial fibrillation  History:         Patient has no prior history of Echocardiogram examinations.                  Sepsis.  Sonographer:     Humphrey Rolls RDCS (AE) Referring Phys:  [id number] Vernetta Honey MANSY Diagnosing Phys: Marcina Millard MD  Sonographer Comments: Suboptimal apical window and suboptimal subcostal window. IMPRESSIONS  1. Left ventricular ejection fraction, by estimation, is 60 to 65%. The left ventricle has normal function. The left ventricle has no regional wall motion abnormalities. Left ventricular diastolic parameters are indeterminate.  2. Right ventricular systolic function is normal. The right ventricular size is normal.  3. The mitral valve is normal in structure. Mild to moderate mitral valve regurgitation. No evidence of mitral stenosis.  4. The aortic valve is normal in structure. Aortic valve regurgitation is not visualized. No aortic stenosis is present.  5. The inferior vena cava is normal in size with greater than 50% respiratory variability, suggesting right atrial pressure of 3 mmHg. FINDINGS  Left Ventricle: Left ventricular ejection fraction, by estimation, is 60 to 65%. The left ventricle has normal function. The left ventricle has no regional wall motion abnormalities. The left ventricular internal cavity size was normal in size. There is  no left ventricular hypertrophy. Left ventricular diastolic parameters are indeterminate. Right Ventricle: The right ventricular size is normal. No increase in right ventricular wall thickness. Right ventricular systolic function is normal. Left Atrium: Left atrial size  was normal in size. Right Atrium: Right atrial size was normal in size. Pericardium: Trivial pericardial effusion is present. Mitral Valve: The mitral valve is normal in structure. Mild to moderate mitral valve regurgitation. No evidence of  mitral valve stenosis. MV peak gradient, 9.6 mmHg. The mean mitral valve gradient is 4.0 mmHg. Tricuspid Valve: The tricuspid valve is normal in structure. Tricuspid valve regurgitation is mild . No evidence of tricuspid stenosis. Aortic Valve: The aortic valve is normal in structure. Aortic valve regurgitation is not visualized. No aortic stenosis is present. Aortic valve mean gradient measures 3.0 mmHg. Aortic valve peak gradient measures 6.2 mmHg. Aortic valve area, by VTI measures 2.06 cm. Pulmonic Valve: The pulmonic valve was normal in structure. Pulmonic valve regurgitation is not visualized. No evidence of pulmonic stenosis. Aorta: The aortic root is normal in size and structure. Venous: The inferior vena cava is normal in size with greater than 50% respiratory variability, suggesting right atrial pressure of 3 mmHg. IAS/Shunts: No atrial level shunt detected by color flow Doppler.  LEFT VENTRICLE PLAX 2D LVIDd:         4.50 cm  Diastology LVIDs:         2.90 cm  LV e' medial:    8.05 cm/s LV PW:         1.10 cm  LV E/e' medial:  16.5 LV IVS:        0.80 cm  LV e' lateral:   11.30 cm/s LVOT diam:     1.80 cm  LV E/e' lateral: 11.7 LV SV:         45 LV SV Index:   26 LVOT Area:     2.54 cm  RIGHT VENTRICLE RV Basal diam:  3.40 cm LEFT ATRIUM           Index LA diam:      4.20 cm 2.43 cm/m LA Vol (A2C): 38.2 ml 22.13 ml/m LA Vol (A4C): 91.8 ml 53.18 ml/m  AORTIC VALVE                   PULMONIC VALVE AV Area (Vmax):    2.04 cm    PV Vmax:       0.83 m/s AV Area (Vmean):   2.13 cm    PV Vmean:      55.900 cm/s AV Area (VTI):     2.06 cm    PV VTI:        0.152 m AV Vmax:           125.00 cm/s PV Peak grad:  2.8 mmHg AV Vmean:          87.400 cm/s PV Mean grad:  1.0 mmHg AV VTI:            0.217 m AV Peak Grad:      6.2 mmHg AV Mean Grad:      3.0 mmHg LVOT Vmax:         100.00 cm/s LVOT Vmean:        73.300 cm/s LVOT VTI:          0.176 m LVOT/AV VTI ratio: 0.81  AORTA Ao Root diam: 2.70 cm MITRAL VALVE                 TRICUSPID VALVE MV Area (PHT): 3.97 cm     TR Peak grad:   26.4 mmHg MV Area VTI:   1.77 cm     TR Vmax:  257.00 cm/s MV Peak grad:  9.6 mmHg MV Mean grad:  4.0 mmHg     SHUNTS MV Vmax:       1.55 m/s     Systemic VTI:  0.18 m MV Vmean:      85.4 cm/s    Systemic Diam: 1.80 cm MV Decel Time: 191 msec MV E velocity: 132.50 cm/s Marcina Millard MD Electronically signed by Marcina Millard MD Signature Date/Time: 05/06/2020/12:09:45 PM    Final      Assessment and Recommendation  87 y.o. female with known persistent atrial fibrillation and hypertension having acute on chronic diastolic dysfunction heart failure after acute back injury now significantly improved needing further adjustments of medication management prior to discharge 1.  Continuation current dosages of medication management for resting and exercise and rehabilitation heart rate control including diltiazem 120 mg and split dose of metoprolol succinate 25 mg twice per day.  No additional dosages at this time due to heart rate control at peak activity unlikely to be significantly concerning and family concerned of potential for bradycardia at rest 2.  Continue anticoagulation for further risk reduction of stroke with atrial fibrillation without change today 3.  Furosemide orally for further risk reduction of acute on chronic diastolic dysfunction congestive heart failure 4.  If ambulating relatively well okay for discharge to rehabilitation center for further treatment options and follow-up in cardiology office thereafter 5.  Call if further questions  Signed, Arnoldo Hooker M.D. FACC

## 2020-05-12 NOTE — Care Management Important Message (Signed)
Important Message  Patient Details  Name: Tracy Singh MRN: [id number] Date of Birth: 05-Jul-1924   Medicare Important Message Given:  Yes     Olegario Messier A Jamaiya Tunnell 05/12/2020, 12:07 PM

## 2020-05-12 NOTE — Progress Notes (Signed)
Physical Therapy Treatment Patient Details Name: Tracy Singh MRN: [id number] DOB: [birth year] Today's Date: 05/12/2020    History of Present Illness Pt is a 87 y/o F with PMH that includes Afib and Von Willebrand disease. Pt presented with chief c/o low back pain (stated started 6WA and was started on muscle relaxers by Martha Clan). Pt with increased pain starting on 4/11 and presented to ED. L-spine XR from 4/12 revealed L2 compression fracture of unknown duration. Pt recommended to wear LSO with ambulation and is currently being managed conservatively, but neuro sx consulted for potential kyphoplasty if pt fails conservative management.    PT Comments    Pt was in supine in bed with daughter x 2 in room. Agrees to PT session after a little encouragement. HR stable throughout session with only elevating to 115 bpm during ambulation. Was able to log roll R to short sit with min assist. Applied brace while seated EOB. Stood to [employer] and ambulated to doorway and return 5 x. No LOB or unsteadiness. Pt will continue to benefit from skilled PT to address strength, and safe functional mobility deficits.   Follow Up Recommendations  SNF;Supervision/Assistance - 24 hour     Equipment Recommendations  Other (comment) (defer to next level of care)       Precautions / Restrictions Precautions Precautions: Back;Fall Precaution Booklet Issued: Yes (comment) Required Braces or Orthoses: Spinal Brace Spinal Brace: Other (comment);Thoracolumbosacral orthotic (Clamshell) Restrictions Other Position/Activity Restrictions: Lumbar brace for ambulation but able to sit EOB w/o.    Mobility  Bed Mobility Overal bed mobility: Needs Assistance Bed Mobility: Rolling;Supine to Sit;Sidelying to Sit Rolling: Min guard Sidelying to sit: Min assist Supine to sit: Min assist   Sit to sidelying: Min assist General bed mobility comments: pt was able to properly log roll R to short sit with min assist. min  assist to progress BLEs back into bed after OOB activity    Transfers Overall transfer level: Needs assistance Equipment used: Rolling walker (2 wheeled) Transfers: Sit to/from Stand Sit to Stand: Min guard;Min assist         General transfer comment: Brace applied prior to standing  Ambulation/Gait Ambulation/Gait assistance: Min guard Gait Distance (Feet): 75 Feet Assistive device: Rolling walker (2 wheeled) Gait Pattern/deviations: Step-through pattern;Decreased step length - right;Decreased step length - left Gait velocity: decreased   General Gait Details: HR elevated to 115 bpm. no LOB or unsteadiness. distance limited by fatigue       Balance Overall balance assessment: Needs assistance Sitting-balance support: No upper extremity supported;Feet supported Sitting balance-Leahy Scale: Good Sitting balance - Comments: Good sitting balance at EOB to don TLSO   Standing balance support: Bilateral upper extremity supported;During functional activity Standing balance-Leahy Scale: Good Standing balance comment: MIN GUARD for stand pivot transfer bed>chair       Cognition Arousal/Alertness: Awake/alert Behavior During Therapy: WFL for tasks assessed/performed Overall Cognitive Status: Within Functional Limits for tasks assessed    General Comments: Pt A&Ox4.      Exercises Other Exercises Other Exercises: Re-education on log roll technique and how to don clam shell brace        Pertinent Vitals/Pain Pain Assessment: 0-10 Pain Score: 3  Faces Pain Scale: Hurts a little bit Pain Location: LBP Pain Descriptors / Indicators: Discomfort;Grimacing Pain Intervention(s): Limited activity within patient's tolerance;Monitored during session;Premedicated before session;Repositioned           PT Goals (current goals can now be found in the care plan section) Acute  Rehab PT Goals Patient Stated Goal: return home once able Progress towards PT goals: Progressing toward  goals    Frequency    7X/week      PT Plan Current plan remains appropriate       AM-PAC PT "6 Clicks" Mobility   Outcome Measure  Help needed turning from your back to your side while in a flat bed without using bedrails?: A Little Help needed moving from lying on your back to sitting on the side of a flat bed without using bedrails?: A Little Help needed moving to and from a bed to a chair (including a wheelchair)?: A Little Help needed standing up from a chair using your arms (e.g., wheelchair or bedside chair)?: A Little Help needed to walk in hospital room?: A Little Help needed climbing 3-5 steps with a railing? : A Lot 6 Click Score: 17    End of Session Equipment Utilized During Treatment: Back brace;Gait belt Activity Tolerance: Patient tolerated treatment well Patient left: in chair;with chair alarm set;with call bell/phone within reach;with family/visitor present Nurse Communication: Mobility status PT Visit Diagnosis: Difficulty in walking, not elsewhere classified (R26.2);Muscle weakness (generalized) (M62.81);Pain     Time: 1062-6948 PT Time Calculation (min) (ACUTE ONLY): 24 min  Charges:  $Gait Training: 8-22 mins $Therapeutic Activity: 8-22 mins                     Jetta Lout PTA 05/12/20, 4:44 PM

## 2020-05-13 LAB — BASIC METABOLIC PANEL
Anion gap: 10 (ref 5–15)
BUN: 14 mg/dL (ref 8–23)
CO2: 29 mmol/L (ref 22–32)
Calcium: 9.1 mg/dL (ref 8.9–10.3)
Chloride: 95 mmol/L — ABNORMAL LOW (ref 98–111)
Creatinine, Ser: 0.67 mg/dL (ref 0.44–1.00)
GFR, Estimated: >60 mL/min (ref >60)
Glucose, Bld: 108 mg/dL — ABNORMAL HIGH (ref 70–99)
Potassium: 4.1 mmol/L (ref 3.5–5.1)
Sodium: 134 mmol/L — ABNORMAL LOW (ref 135–145)

## 2020-05-13 LAB — CBC
HCT: 42.4 % (ref 36.0–46.0)
Hemoglobin: 14.8 g/dL (ref 12.0–15.0)
MCH: 31.6 pg (ref 26.0–34.0)
MCHC: 34.9 g/dL (ref 30.0–36.0)
MCV: 90.6 fL (ref 80.0–100.0)
Platelets: 308 10*3/uL (ref 150–400)
RBC: 4.68 MIL/uL (ref 3.87–5.11)
RDW: 13.4 % (ref 11.5–15.5)
WBC: 5.7 10*3/uL (ref 4.0–10.5)
nRBC: 0 % (ref 0.0–0.2)

## 2020-05-13 NOTE — Progress Notes (Signed)
Physical Therapy Treatment Patient Details Name: Tracy Singh MRN: [id number] DOB: 05-27-1924 Today's Date: 05/13/2020    History of Present Illness Pt is a 87 y/o F with PMH that includes Afib and Von Willebrand disease. Pt presented with chief c/o low back pain (stated started 6WA and was started on muscle relaxers by Martha Clan). Pt with increased pain starting on 4/11 and presented to ED. L-spine XR from 4/12 revealed L2 compression fracture of unknown duration. Pt recommended to wear LSO with ambulation and is currently being managed conservatively, but neuro sx consulted for potential kyphoplasty if pt fails conservative management.    PT Comments    Pt was long sitting in bed with daughters x 2 in room. Pt is agreeable to session and is cooperative and pleasant throughout. She was able to log roll R to short sit. Stood to [employer] and ambulated 200 ft with CGA. Pt tolerated well however did have HR elevation to 138 bpm. Fatigue limited. Pt will greatly benefit from SNF at [place] to address deficits prior to returning home.    Follow Up Recommendations  SNF;Supervision/Assistance - 24 hour     Equipment Recommendations  Other (comment) (defer to next level of care)       Precautions / Restrictions Precautions Precautions: Back;Fall Precaution Booklet Issued: Yes (comment) Required Braces or Orthoses: Spinal Brace Spinal Brace: Other (comment);Thoracolumbosacral orthotic (clamshell) Spinal Brace Comments: BLT Restrictions Weight Bearing Restrictions: No    Mobility  Bed Mobility Overal bed mobility: Needs Assistance Bed Mobility: Rolling;Supine to Sit;Sidelying to Sit Rolling: Min guard Sidelying to sit: Min assist Supine to sit: Min assist   Transfers Overall transfer level: Needs assistance Equipment used: Rolling walker (2 wheeled) Transfers: Sit to/from Stand Sit to Stand: Min guard   Ambulation/Gait Ambulation/Gait assistance: Land (Feet): 200  Feet Assistive device: Rolling walker (2 wheeled) Gait Pattern/deviations: Step-through pattern;Decreased step length - right;Decreased step length - left Gait velocity: decreased   General Gait Details: pt was able to advance ambulation to 200 ft with RW however does have HR elevation to 138 and in A fib throughout. pt reports no symptoms or distress      Balance Overall balance assessment: Needs assistance Sitting-balance support: No upper extremity supported;Feet supported Sitting balance-Leahy Scale: Good     Standing balance support: Bilateral upper extremity supported;During functional activity Standing balance-Leahy Scale: Good       Cognition Arousal/Alertness: Awake/alert Behavior During Therapy: WFL for tasks assessed/performed Overall Cognitive Status: Within Functional Limits for tasks assessed      General Comments: Pt A&Ox4.             Pertinent Vitals/Pain Pain Assessment: 0-10 Pain Score: 3  Faces Pain Scale: Hurts a little bit Pain Location: LBP Pain Descriptors / Indicators: Discomfort;Grimacing Pain Intervention(s): Premedicated before session;Monitored during session;Limited activity within patient's tolerance;Repositioned           PT Goals (current goals can now be found in the care plan section) Acute Rehab PT Goals Patient Stated Goal: return home once able Progress towards PT goals: Progressing toward goals    Frequency    7X/week      PT Plan Current plan remains appropriate       AM-PAC PT "6 Clicks" Mobility   Outcome Measure  Help needed turning from your back to your side while in a flat bed without using bedrails?: A Little Help needed moving from lying on your back to sitting on the side of a flat  bed without using bedrails?: A Little Help needed moving to and from a bed to a chair (including a wheelchair)?: A Little Help needed standing up from a chair using your arms (e.g., wheelchair or bedside chair)?: A  Little Help needed to walk in hospital room?: A Little Help needed climbing 3-5 steps with a railing? : A Lot 6 Click Score: 17    End of Session Equipment Utilized During Treatment: Back brace;Gait belt Activity Tolerance: Patient tolerated treatment well;Patient limited by fatigue Patient left: in chair;with chair alarm set;with call bell/phone within reach;with family/visitor present Nurse Communication: Mobility status PT Visit Diagnosis: Difficulty in walking, not elsewhere classified (R26.2);Muscle weakness (generalized) (M62.81);Pain     Time: 7416-3845 PT Time Calculation (min) (ACUTE ONLY): 25 min  Charges:  $Gait Training: 8-22 mins $Therapeutic Activity: 8-22 mins                     Jetta Lout PTA 05/13/20, 1:24 PM

## 2020-05-13 NOTE — Progress Notes (Signed)
PROGRESS NOTE    [name]  VQM:[id number] DOB: 06-Feb-1924 DOA: 05/05/2020 PCP: Reubin Milan, MD   Brief Narrative:Tracy Angelica Robitailleis a 87 y.o.Caucasian femalewith medical history significant foratrial fibrillation on Pradaxa and von Willebrand disease, who presented to the emergency room with acute onset of low back pain for which she was recently seen by Dr. Martha Clan and prescribed Robaxin with mild help. She has been having urinary frequency and urgency and felt to have a UTI. She was seen by her cardiologist and given p.o. Cipro.She does have bilateral lower extremity edema. Her pulse extremities dropped to 85% on room air in the ER and she was placed on 2 L of O2 by nasal cannula  4/15-PT rec. SNF. Brace here. Pt with no new complaints. No overnight issues.  On telemetry with physical therapy heart rate increased to the 120s.  Given extra dose of Toprol-XL 25. 4/16-heart rate from 102 to 110 at rest in bed.  Patient denies any dizziness, chest pain or shortness of breath 4/17- while I was in room pt working with PT, as she got up HR 130's in afib. NSg reported pt's daughter did not want her to get beta blk at scheduled am time but to be given at 12 PM as she takes it at home.  Also found out had ordered Toprol-XL twice daily but daughter had questions now and wanted it once a day. 4/18- HR at rest 80's.  4/19-no overnight issues. No complaints 4/20: No clinical status changes.  Discussed with patient's daughters.  They have excepted better [facility].  Insurance authorization initiated.    Assessment & Plan:   Active Problems:   Atrial fibrillation with rapid ventricular response (HCC)  Atrial fibrillation with rapid ventricular response Rate control is been challenging Echo normal EF Cardiology was following Made medication adjustments until good rate control Plan: Continue metoprolol succinate 25 twice daily Continue Cardizem CD 120 mg  daily Continue Pradaxa Outpatient cardiology follow-up Medically stable for discharge  Acute on chronic DHF- more euvolemic on exam Improved  Continue A. fib control  Continue beta-blockers and Lasix  I's and O's   L2 compression fracture possibly pathological secondary to osteoporosis. Continue with pain management  Neurosurgery consulted, input was appreciated 4/19-LSO  when out of bed  SNF bed pending Continue PT  neurosurgery will consider kyphoplasty if she cannot ambulate with LSO Follow-up with neurosurgery as outpatient  Acute respiratory failure with hypoxia, likely secondary to acute DHF. Improved, now on room air Continue incentive spirometer Keep O2 sat above 92%  Hypertensive urgency, likely contributing to her acute CHF. improved Continue to monitor  Recent UTI status post treatment with Cipro. -Urinalysis currently negative. She finished taking Cipro.   DVT prophylaxis: Pradaxa Code Status: Full Family Communication: 2 daughters at bedside Disposition Plan: Status is: Inpatient  Remains inpatient appropriate because:Unsafe d/c plan   Dispo: The patient is from: Home              Anticipated d/c is to: SNF              Patient currently is medically stable to d/c.   Difficult to place patient No  Family is excepted a bed at [facility].  Patient medically stable for discharge.  Discharge pending insurance authorization.     Level of care: Progressive Cardiac  Consultants:   None   Procedures: None  Antimicrobials:   None   Subjective: Seen and examined.  Sitting comfortably in chair.  No visible distress.  Answers questions appropriately.  2 daughters at bedside.  Objective: Vitals:   05/13/20 0820 05/13/20 0900 05/13/20 1300 05/13/20 1316  BP: 121/86   (!) 103/59  Pulse: 88   83  Resp: (!) 22  20   Temp:  97.8 F (36.6 C)    TempSrc:   Oral   SpO2: 100%   100%  Weight:  60.2 kg    Height:        Intake/Output  Summary (Last 24 hours) at 05/13/2020 1551 Last data filed at 05/13/2020 1406 Gross per 24 hour  Intake 840 ml  Output 1525 ml  Net -685 ml   Filed Weights   05/11/20 0917 05/12/20 1153 05/13/20 0900  Weight: 61 kg 60.2 kg 60.2 kg    Examination:  General exam: Appears calm and comfortable  Respiratory system: Clear to auscultation. Respiratory effort normal. Cardiovascular system: Regular rate, irregular rhythm, no murmurs Gastrointestinal system: Abdomen is nondistended, soft and nontender. No organomegaly or masses felt. Normal bowel sounds heard. Central nervous system: Alert and oriented. No focal neurological deficits. Extremities: Symmetric 5 x 5 power. Skin: No rashes, lesions or ulcers Psychiatry: Judgement and insight appear normal. Mood & affect appropriate.     Data Reviewed: I have personally reviewed following labs and imaging studies  CBC: Recent Labs  Lab 05/08/20 0720 05/13/20 0523  WBC 8.4 5.7  HGB 15.8* 14.8  HCT 45.6 42.4  MCV 90.1 90.6  PLT 288 308   Basic Metabolic Panel: Recent Labs  Lab 05/07/20 0613 05/08/20 0720 05/09/20 0524 05/10/20 0532 05/13/20 0523  NA 132* 128* 132* 132* 134*  K 3.5 3.9 3.7 3.7 4.1  CL 91* 86* 90* 90* 95*  CO2 32 30 31 30 29   GLUCOSE 113* 130* 116* 102* 108*  BUN 15 15 15 16 14   CREATININE 0.78 0.82 0.80 0.66 0.67  CALCIUM 8.8* 9.0 8.9 9.0 9.1   GFR: Estimated Creatinine Clearance: 37.9 mL/min (by C-G formula based on SCr of 0.67 mg/dL). Liver Function Tests: No results for input(s): AST, ALT, ALKPHOS, BILITOT, PROT, ALBUMIN in the last 168 hours. No results for input(s): LIPASE, AMYLASE in the last 168 hours. No results for input(s): AMMONIA in the last 168 hours. Coagulation Profile: No results for input(s): INR, PROTIME in the last 168 hours. Cardiac Enzymes: No results for input(s): CKTOTAL, CKMB, CKMBINDEX, TROPONINI in the last 168 hours. BNP (last 3 results) No results for input(s): PROBNP in the  last 8760 hours. HbA1C: No results for input(s): HGBA1C in the last 72 hours. CBG: No results for input(s): GLUCAP in the last 168 hours. Lipid Profile: No results for input(s): CHOL, HDL, LDLCALC, TRIG, CHOLHDL, LDLDIRECT in the last 72 hours. Thyroid Function Tests: No results for input(s): TSH, T4TOTAL, FREET4, T3FREE, THYROIDAB in the last 72 hours. Anemia Panel: No results for input(s): VITAMINB12, FOLATE, FERRITIN, TIBC, IRON, RETICCTPCT in the last 72 hours. Sepsis Labs: No results for input(s): PROCALCITON, LATICACIDVEN in the last 168 hours.  Recent Results (from the past 240 hour(s))  Resp Panel by RT-PCR (Flu A&B, Covid) Nasopharyngeal Swab     Status: None   Collection Time: 05/05/20  1:26 AM   Specimen: Nasopharyngeal Swab; Nasopharyngeal(NP) swabs in vial transport medium  Result Value Ref Range Status   SARS Coronavirus 2 by RT PCR NEGATIVE NEGATIVE Final    Comment: (NOTE) SARS-CoV-2 target nucleic acids are NOT DETECTED.  The SARS-CoV-2 RNA is generally detectable in upper respiratory specimens during the  acute phase of infection. The lowest concentration of SARS-CoV-2 viral copies this assay can detect is 138 copies/mL. A negative result does not preclude SARS-Cov-2 infection and should not be used as the sole basis for treatment or other patient management decisions. A negative result may occur with  improper specimen collection/handling, submission of specimen other than nasopharyngeal swab, presence of viral mutation(s) within the areas targeted by this assay, and inadequate number of viral copies(<138 copies/mL). A negative result must be combined with clinical observations, patient history, and epidemiological information. The expected result is Negative.  Fact Sheet for Patients:  BloggerCourse.com  Fact Sheet for Healthcare Providers:  SeriousBroker.it  This test is no t yet approved or cleared by the  [place] FDA and  has been authorized for detection and/or diagnosis of SARS-CoV-2 by FDA under an Emergency Use Authorization (EUA). This EUA will remain  in effect (meaning this test can be used) for the duration of the COVID-19 declaration under Section 564(b)(1) of the Act, 21 U.S.C.section 360bbb-3(b)(1), unless the authorization is terminated  or revoked sooner.       Influenza A by PCR NEGATIVE NEGATIVE Final   Influenza B by PCR NEGATIVE NEGATIVE Final    Comment: (NOTE) The Xpert Xpress SARS-CoV-2/FLU/RSV plus assay is intended as an aid in the diagnosis of influenza from Nasopharyngeal swab specimens and should not be used as a sole basis for treatment. Nasal washings and aspirates are unacceptable for Xpert Xpress SARS-CoV-2/FLU/RSV testing.  Fact Sheet for Patients: BloggerCourse.com  Fact Sheet for Healthcare Providers: SeriousBroker.it  This test is not yet approved or cleared by the [place] FDA and has been authorized for detection and/or diagnosis of SARS-CoV-2 by FDA under an Emergency Use Authorization (EUA). This EUA will remain in effect (meaning this test can be used) for the duration of the COVID-19 declaration under Section 564(b)(1) of the Act, 21 U.S.C. section 360bbb-3(b)(1), unless the authorization is terminated or revoked.  Performed at [facility], [address] [place], [place] [address]          Radiology Studies: No results found.      Scheduled Meds: . dabigatran  150 mg Oral Q12H  . diltiazem  120 mg Oral Daily  . furosemide  20 mg Oral Daily  . lidocaine  1 patch Transdermal Q24H  . metoprolol succinate  25 mg Oral BID  . multivitamin with minerals  1 tablet Oral Daily   Continuous Infusions:   LOS: 8 days    Time spent: 15 minutes    Tresa Moore, MD [facility] Pager [phone]  If 7PM-7AM, please contact  night-coverage 05/13/2020, 3:51 PM

## 2020-05-13 NOTE — Progress Notes (Signed)
Occupational Therapy Treatment Patient Details Name: Tracy Singh MRN: [id number] DOB: 1924-04-18 Today's Date: 05/13/2020    History of present illness Pt is a 87 y/o F with PMH that includes Afib and Von Willebrand disease. Pt presented with chief c/o low back pain (stated started 6WA and was started on muscle relaxers by Martha Clan). Pt with increased pain starting on 4/11 and presented to ED. L-spine XR from 4/12 revealed L2 compression fracture of unknown duration. Pt recommended to wear LSO with ambulation and is currently being managed conservatively, but neuro sx consulted for potential kyphoplasty if pt fails conservative management.   OT comments  Pt seen for OT treatment this date to f/u re: safety with ADLs/ADL mobility. Pt requires MIN A/CGA for STS from recliner (typically has been able to perform with CGA, but reporting fatigue from sitting up after PT this AM. Pt requires use of RW and MIN A/CGA for 4-5 steps from chair to bed as well as cues for safe walker use to come to stand as well as to navigate with pivoting/turning. Pt requires MIN A and cues to reach back to control descent to EOB sitting. Pt and daughters educated about importance of controlling descent to sitting to reduce risk of further back injury. Pt requires MIN A for UB dressing in EOB sitting and MIN A for logroll transfer back to bed with OT Educating re: sequence to logroll. Pt and dtrs with good understanding. Pt with some limited carryover of new learning, but responds well to cues. Will continue to follow. Continue to anticipate that pt will require STR in SNF setting to improve strength and safety.   Follow Up Recommendations  SNF    Equipment Recommendations  3 in 1 bedside commode;Tub/shower seat    Recommendations for Other Services      Precautions / Restrictions Precautions Precautions: Back;Fall Precaution Booklet Issued: Yes (comment) Required Braces or Orthoses: Spinal Brace Spinal Brace:  Other (comment);Thoracolumbosacral orthotic (clamshell) Spinal Brace Comments: BLT Restrictions Weight Bearing Restrictions: No Other Position/Activity Restrictions: Lumbar brace for ambulation but able to sit EOB w/o.       Mobility Bed Mobility Overal bed mobility: Needs Assistance Bed Mobility: Sit to Sidelying Rolling: Min guard Sidelying to sit: Min assist Supine to sit: Min assist   Sit to sidelying: Mod assist General bed mobility comments: Pt with some increased discomfort which she reports is 2/2 to brace and she is noted to require increased assistance for LEs back to bed.    Transfers Overall transfer level: Needs assistance Equipment used: Rolling walker (2 wheeled) Transfers: Sit to/from Stand Sit to Stand: Min guard;Min assist         General transfer comment: increased time/assist from recliner d/t lower surface and fatigued. able to stand from bed with only CGA    Balance Overall balance assessment: Needs assistance Sitting-balance support: No upper extremity supported;Feet supported Sitting balance-Leahy Scale: Good Sitting balance - Comments: Good sitting balance at EOB   Standing balance support: Bilateral upper extremity supported;During functional activity Standing balance-Leahy Scale: Fair Standing balance comment: reporting fatigue after sitting up in chair after physical therapy, and does reqire some increased assist for balance with taking the 4-5 small steps from chair back to bed. MIN A/CGA for balance                           ADL either performed or assessed with clinical judgement   ADL Overall ADL's : Needs  assistance/impaired                 Upper Body Dressing : Min guard;Minimal assistance;Sitting Upper Body Dressing Details (indicate cue type and reason): EOB sitting, CGA/SUPV for sitting balance, MIN A to sequence doffing gown from back side like robe                         Vision Baseline  Vision/History: Wears glasses Patient Visual Report: No change from baseline     Perception     Praxis      Cognition Arousal/Alertness: Awake/alert Behavior During Therapy: WFL for tasks assessed/performed Overall Cognitive Status: Within Functional Limits for tasks assessed                                 General Comments: Pt A&Ox4.        Exercises Other Exercises Other Exercises: OT facilitates pt STS with education re: hand placement with RW, controlling descent with stand to sit to prevent increased fracture/back pain, re-ed: logroll technique, Seated UB dsg with MIN A.   Shoulder Instructions       General Comments      Pertinent Vitals/ Pain       Pain Assessment: Faces Pain Score: 3  Faces Pain Scale: Hurts little more Pain Location: LBP with mobilization Pain Descriptors / Indicators: Discomfort;Grimacing;Moaning Pain Intervention(s): Monitored during session;Limited activity within patient's tolerance  Home Living                                          Prior Functioning/Environment              Frequency  Min 2X/week        Progress Toward Goals  OT Goals(current goals can now be found in the care plan section)  Progress towards OT goals: Progressing toward goals  Acute Rehab OT Goals Patient Stated Goal: return home once able OT Goal Formulation: With patient Time For Goal Achievement: 05/21/20 Potential to Achieve Goals: Good  Plan Discharge plan remains appropriate;Frequency remains appropriate    Co-evaluation                 AM-PAC OT "6 Clicks" Daily Activity     Outcome Measure   Help from another person eating meals?: None Help from another person taking care of personal grooming?: A Little Help from another person toileting, which includes using toliet, bedpan, or urinal?: A Lot Help from another person bathing (including washing, rinsing, drying)?: A Lot Help from another person to  put on and taking off regular upper body clothing?: A Little Help from another person to put on and taking off regular lower body clothing?: A Lot 6 Click Score: 16    End of Session Equipment Utilized During Treatment: Gait belt;Rolling walker;Back brace  OT Visit Diagnosis: Unsteadiness on feet (R26.81);Muscle weakness (generalized) (M62.81) Pain - part of body:  (back)   Activity Tolerance Patient tolerated treatment well   Patient Left in chair;with call bell/phone within reach;with chair alarm set;with family/visitor present   Nurse Communication Mobility status        Time: 1610-9604 OT Time Calculation (min): 25 min  Charges: OT General Charges $OT Visit: 1 Visit OT Treatments $Self Care/Home Management : 8-22 mins $Therapeutic Activity: 8-22 mins  Jill Side  Jake Samples, MS, OTR/L ascom [phone] 05/13/20, 3:04 PM

## 2020-05-13 NOTE — TOC Progression Note (Signed)
Transition of Care [facility]) - Progression Note    Patient Details  Name: Tracy Singh MRN: [id number] Date of Birth: 03/14/1924  Transition of Care [facility]) CM/SW Contact  Maree Krabbe, LCSW Phone Number: 05/13/2020, 10:38 AM  Clinical Narrative:   Provided bed offers to Pt's family--[facility] [place] will not accept a LOG while we wait for auth.    Expected Discharge Plan: Skilled Nursing Facility Barriers to Discharge: Continued Medical Work up  Expected Discharge Plan and Services Expected Discharge Plan: Skilled Nursing Facility     Post Acute Care Choice: Skilled Nursing Facility Living arrangements for the past 2 months: Single Family Home                                       Social Determinants of Health (SDOH) Interventions    Readmission Risk Interventions No flowsheet data found.

## 2020-05-14 NOTE — Progress Notes (Signed)
PROGRESS NOTE    Tracy Singh  LDJ:[id number] DOB: [birth year] DOA: 05/05/2020 PCP: Reubin Milan, MD   Brief Narrative:  Tracy Jenning Robitailleis a 87 y.o.Caucasian femalewith medical history significant foratrial fibrillation on Pradaxa and von Willebrand disease, who presented to the emergency room with acute onset of low back pain for which she was recently seen by Dr. Martha Clan and prescribed Robaxin with mild help. She has been having urinary frequency and urgency and felt to have a UTI. She was seen by her cardiologist and given p.o. Cipro.She does have bilateral lower extremity edema. Her pulse extremities dropped to 85% on room air in the ER and she was placed on 2 L of O2 by nasal cannula  4/15-PT rec. SNF. Brace here. Pt with no new complaints. No overnight issues.  On telemetry with physical therapy heart rate increased to the 120s.  Given extra dose of Toprol-XL 25. 4/16-heart rate from 102 to 110 at rest in bed.  Patient denies any dizziness, chest pain or shortness of breath 4/17- while I was in room pt working with PT, as she got up HR 130's in afib. NSg reported pt's daughter did not want her to get beta blk at scheduled am time but to be given at 12 PM as she takes it at home.  Also found out had ordered Toprol-XL twice daily but daughter had questions now and wanted it once a day. 4/18- HR at rest 80's.  4/19-no overnight issues. No complaints 4/20: No clinical status changes.  Discussed with patient's daughters.  They [facility].  Insurance authorization initiated. 4/21: No clinical status changes.  Daughter at bedside.  Pending insurance authorization.   Assessment & Plan:   Active Problems:   Atrial fibrillation with rapid ventricular response (HCC)  Atrial fibrillation with rapid ventricular response Rate control is been challenging Echo normal EF Cardiology was following Made medication adjustments until good rate  control Plan: Continue metoprolol succinate 25 twice daily Continue Cardizem CD 120 mg daily Continue Pradaxa Outpatient cardiology follow-up Patient is medically stable for discharge.  Pending insurance authorization for discharge to skilled nursing facility.  Acute on chronic DHF- more euvolemic on exam Improved  Continue A. fib control  Continue beta-blockers and Lasix  I's and O's   L2 compression fracture possibly pathological secondary to osteoporosis. Continue with pain management  Neurosurgery consulted, input was appreciated 4/19-LSO  when out of bed  SNF bed pending Continue PT  neurosurgery will consider kyphoplasty if she cannot ambulate with LSO Follow-up with neurosurgery as outpatient  Acute respiratory failure with hypoxia, likely secondary to acute DHF. Improved, now on room air Continue incentive spirometer Keep O2 sat above 92%  Hypertensive urgency, likely contributing to her acute CHF. improved Continue to monitor  Recent UTI status post treatment with Cipro. -Urinalysis currently negative. She finished taking Cipro.   DVT prophylaxis: Pradaxa Code Status: Full Family Communication: Daughter at bedside Disposition Plan: Status is: Inpatient  Remains inpatient appropriate because:Unsafe d/c plan   Dispo: The patient is from: Home              Anticipated d/c is to: SNF              Patient currently is medically stable to d/c.   Difficult to place patient No  Pending insurance authorization for [facility].  As of this date insurance authorization is still pending.     Level of care: Progressive Cardiac  Consultants:   None   Procedures: None  Antimicrobials:   None   Subjective: Patient seen and examined.  Sitting up in bed.  No visible distress.  Answers all questions appropriately.  Objective: Vitals:   05/13/20 2000 05/13/20 2043 05/14/20 0418 05/14/20 0926  BP: 114/82  124/60 (!) 143/93   Pulse: 68  71 91  Resp: 20  20 20   Temp: 97.7 F (36.5 C) 97.7 F (36.5 C) 97.6 F (36.4 C) 98 F (36.7 C)  TempSrc: Oral Oral Oral Oral  SpO2: 98%  99% 100%  Weight:   60.4 kg   Height:        Intake/Output Summary (Last 24 hours) at 05/14/2020 1402 Last data filed at 05/14/2020 1346 Gross per 24 hour  Intake 1320 ml  Output 2500 ml  Net -1180 ml   Filed Weights   05/12/20 1153 05/13/20 0900 05/14/20 0418  Weight: 60.2 kg 60.2 kg 60.4 kg    Examination:  General exam: Appears calm and comfortable  Respiratory system: Clear to auscultation. Respiratory effort normal. Cardiovascular system: Regular rate, irregular rhythm, no murmurs Gastrointestinal system: Abdomen is nondistended, soft and nontender. No organomegaly or masses felt. Normal bowel sounds heard. Central nervous system: Alert and oriented. No focal neurological deficits. Extremities: Symmetric 5 x 5 power. Skin: No rashes, lesions or ulcers Psychiatry: Judgement and insight appear normal. Mood & affect appropriate.     Data Reviewed: I have personally reviewed following labs and imaging studies  CBC: Recent Labs  Lab 05/08/20 0720 05/13/20 0523  WBC 8.4 5.7  HGB 15.8* 14.8  HCT 45.6 42.4  MCV 90.1 90.6  PLT 288 308   Basic Metabolic Panel: Recent Labs  Lab 05/08/20 0720 05/09/20 0524 05/10/20 0532 05/13/20 0523  NA 128* 132* 132* 134*  K 3.9 3.7 3.7 4.1  CL 86* 90* 90* 95*  CO2 30 31 30 29   GLUCOSE 130* 116* 102* 108*  BUN 15 15 16 14   CREATININE 0.82 0.80 0.66 0.67  CALCIUM 9.0 8.9 9.0 9.1   GFR: Estimated Creatinine Clearance: 37.9 mL/min (by C-G formula based on SCr of 0.67 mg/dL). Liver Function Tests: No results for input(s): AST, ALT, ALKPHOS, BILITOT, PROT, ALBUMIN in the last 168 hours. No results for input(s): LIPASE, AMYLASE in the last 168 hours. No results for input(s): AMMONIA in the last 168 hours. Coagulation Profile: No results for input(s): INR, PROTIME in the last  168 hours. Cardiac Enzymes: No results for input(s): CKTOTAL, CKMB, CKMBINDEX, TROPONINI in the last 168 hours. BNP (last 3 results) No results for input(s): PROBNP in the last 8760 hours. HbA1C: No results for input(s): HGBA1C in the last 72 hours. CBG: No results for input(s): GLUCAP in the last 168 hours. Lipid Profile: No results for input(s): CHOL, HDL, LDLCALC, TRIG, CHOLHDL, LDLDIRECT in the last 72 hours. Thyroid Function Tests: No results for input(s): TSH, T4TOTAL, FREET4, T3FREE, THYROIDAB in the last 72 hours. Anemia Panel: No results for input(s): VITAMINB12, FOLATE, FERRITIN, TIBC, IRON, RETICCTPCT in the last 72 hours. Sepsis Labs: No results for input(s): PROCALCITON, LATICACIDVEN in the last 168 hours.  Recent Results (from the past 240 hour(s))  Resp Panel by RT-PCR (Flu A&B, Covid) Nasopharyngeal Swab     Status: None   Collection Time: 05/05/20  1:26 AM   Specimen: Nasopharyngeal Swab; Nasopharyngeal(NP) swabs in vial transport medium  Result Value Ref Range Status   SARS Coronavirus 2 by RT PCR NEGATIVE NEGATIVE Final    Comment: (NOTE)  SARS-CoV-2 target nucleic acids are NOT DETECTED.  The SARS-CoV-2 RNA is generally detectable in upper respiratory specimens during the acute phase of infection. The lowest concentration of SARS-CoV-2 viral copies this assay can detect is 138 copies/mL. A negative result does not preclude SARS-Cov-2 infection and should not be used as the sole basis for treatment or other patient management decisions. A negative result may occur with  improper specimen collection/handling, submission of specimen other than nasopharyngeal swab, presence of viral mutation(s) within the areas targeted by this assay, and inadequate number of viral copies(<138 copies/mL). A negative result must be combined with clinical observations, patient history, and epidemiological information. The expected result is Negative.  Fact Sheet for Patients:   BloggerCourse.com  Fact Sheet for Healthcare Providers:  SeriousBroker.it  This test is no t yet approved or cleared by the [place] FDA and  has been authorized for detection and/or diagnosis of SARS-CoV-2 by FDA under an Emergency Use Authorization (EUA). This EUA will remain  in effect (meaning this test can be used) for the duration of the COVID-19 declaration under Section 564(b)(1) of the Act, 21 U.S.C.section 360bbb-3(b)(1), unless the authorization is terminated  or revoked sooner.       Influenza A by PCR NEGATIVE NEGATIVE Final   Influenza B by PCR NEGATIVE NEGATIVE Final    Comment: (NOTE) The Xpert Xpress SARS-CoV-2/FLU/RSV plus assay is intended as an aid in the diagnosis of influenza from Nasopharyngeal swab specimens and should not be used as a sole basis for treatment. Nasal washings and aspirates are unacceptable for Xpert Xpress SARS-CoV-2/FLU/RSV testing.  Fact Sheet for Patients: BloggerCourse.com  Fact Sheet for Healthcare Providers: SeriousBroker.it  This test is not yet approved or cleared by the [place] FDA and has been authorized for detection and/or diagnosis of SARS-CoV-2 by FDA under an Emergency Use Authorization (EUA). This EUA will remain in effect (meaning this test can be used) for the duration of the COVID-19 declaration under Section 564(b)(1) of the Act, 21 U.S.C. section 360bbb-3(b)(1), unless the authorization is terminated or revoked.  Performed at [facility], [address] [place], [place] [address]          Radiology Studies: No results found.      Scheduled Meds: . dabigatran  150 mg Oral Q12H  . diltiazem  120 mg Oral Daily  . furosemide  20 mg Oral Daily  . lidocaine  1 patch Transdermal Q24H  . metoprolol succinate  25 mg Oral BID  . multivitamin with minerals  1 tablet Oral Daily    Continuous Infusions:   LOS: 9 days    Time spent: 15 minutes    Tresa Moore, MD [facility] Pager [phone]  If 7PM-7AM, please contact night-coverage 05/14/2020, 2:02 PM

## 2020-05-14 NOTE — Plan of Care (Signed)
Pt resting quietly in room, no complains during this shift, awaiting insurance authorization. Problem: Education: Goal: Knowledge of General Education information will improve Description: Including pain rating scale, medication(s)/side effects and non-pharmacologic comfort measures Outcome: Progressing   Problem: Health Behavior/Discharge Planning: Goal: Ability to manage health-related needs will improve Outcome: Progressing   Problem: Clinical Measurements: Goal: Ability to maintain clinical measurements within normal limits will improve Outcome: Progressing Goal: Will remain free from infection Outcome: Progressing

## 2020-05-14 NOTE — TOC Progression Note (Signed)
Transition of Care [facility]) - Progression Note    Patient Details  Name: Tracy Singh MRN: [id number] Date of Birth: May 12, 1924  [facility]) CM/SW Contact  Hetty Ely, RN Phone Number: 05/14/2020, 12:17 PM  Clinical Narrative:  Tammy from [facility] says Authorization is still pending. Will keep me updated. LOG offered, answer pending.     Expected Discharge Plan: Skilled Nursing Facility Barriers to Discharge: Continued Medical Work up  Expected Discharge Plan and Services Expected Discharge Plan: Skilled Nursing Facility     Post Acute Care Choice: Skilled Nursing Facility Living arrangements for the past 2 months: Single Family Home                                       Social Determinants of Health (SDOH) Interventions    Readmission Risk Interventions No flowsheet data found.

## 2020-05-14 NOTE — Progress Notes (Signed)
Physical Therapy Treatment Patient Details Name: Tracy Singh MRN: [id number] DOB: 05/23/1924 Today's Date: 05/14/2020    History of Present Illness Pt is a 87 y/o F with PMH that includes Afib and Von Willebrand disease. Pt presented with chief c/o low back pain (stated started 6WA and was started on muscle relaxers by Martha Clan). Pt with increased pain starting on 4/11 and presented to ED. L-spine XR from 4/12 revealed L2 compression fracture of unknown duration. Pt recommended to wear LSO with ambulation and is currently being managed conservatively, but neuro sx consulted for potential kyphoplasty if pt fails conservative management.    PT Comments    Pt was asleep in long sitting. Agrees to PT session once awake and was cooperative and pleasant throughout. Pt required min assist to achieve EOB short sit. Applied back brace once sitting EOB. She stood with CGA and ambulated 400 ft. Does endorse fatigue however pt HR was stable with sao2 > 94% on rm air. Once returned to room, pt sat in recliner with daughters x 2. Pt is progressing but will benefit from SNF at DC to address deficits while improving independence with ADLs.    Follow Up Recommendations  SNF;Supervision/Assistance - 24 hour     Equipment Recommendations  Other (comment) (defer to next level of care)       Precautions / Restrictions Precautions Precautions: Back;Fall Precaution Booklet Issued: Yes (comment) Required Braces or Orthoses: Spinal Brace Spinal Brace: Other (comment);Thoracolumbosacral orthotic Spinal Brace Comments: BLT Restrictions Weight Bearing Restrictions: No    Mobility  Bed Mobility Overal bed mobility: Needs Assistance Bed Mobility: Rolling;Sidelying to Sit;Supine to Sit Rolling: Min guard Sidelying to sit: Min assist Supine to sit: Min assist     General bed mobility comments: Pt continues to require min assist to exit R side of bed . vcs required with heavy use of bedrail     Transfers Overall transfer level: Needs assistance Equipment used: Rolling walker (2 wheeled) Transfers: Sit to/from Stand Sit to Stand: Min guard         General transfer comment: CGA for safety to stand from slightly elevated bed height. good eccentric controll with stand > sit  Ambulation/Gait Ambulation/Gait assistance: Min guard Gait Distance (Feet): 400 Feet Assistive device: Rolling walker (2 wheeled) Gait Pattern/deviations: Step-through pattern;Decreased step length - right;Decreased step length - left Gait velocity: decreased   General Gait Details: Pt was able to ambulate 2 laps in hallway with RW. HR elevation to 115bpm at [facility]      Balance Overall balance assessment: Needs assistance Sitting-balance support: No upper extremity supported;Feet supported Sitting balance-Leahy Scale: Good     Standing balance support: Bilateral upper extremity supported;During functional activity Standing balance-Leahy Scale: Fair Standing balance comment: reliant on BUE support       Cognition Arousal/Alertness: Awake/alert Behavior During Therapy: WFL for tasks assessed/performed Overall Cognitive Status: Within Functional Limits for tasks assessed      General Comments: Pt A&Ox4.       Pertinent Vitals/Pain Pain Assessment: 0-10 Pain Score: 4  Faces Pain Scale: Hurts a little bit Pain Location: LBP with mobilization Pain Descriptors / Indicators: Discomfort;Grimacing;Moaning Pain Intervention(s): Limited activity within patient's tolerance;Monitored during session;Premedicated before session;Repositioned           PT Goals (current goals can now be found in the care plan section) Acute Rehab PT Goals Patient Stated Goal: return home once able Progress towards PT goals: Progressing toward goals    Frequency    7X/week  PT Plan Current plan remains appropriate       AM-PAC PT "6 Clicks" Mobility   Outcome Measure  Help needed turning from  your back to your side while in a flat bed without using bedrails?: A Little Help needed moving from lying on your back to sitting on the side of a flat bed without using bedrails?: A Little Help needed moving to and from a bed to a chair (including a wheelchair)?: A Little Help needed standing up from a chair using your arms (e.g., wheelchair or bedside chair)?: A Little Help needed to walk in hospital room?: A Little Help needed climbing 3-5 steps with a railing? : A Little 6 Click Score: 18    End of Session Equipment Utilized During Treatment: Back brace;Gait belt Activity Tolerance: Patient tolerated treatment well;Patient limited by fatigue Patient left: in chair;with chair alarm set;with call bell/phone within reach;with family/visitor present Nurse Communication: Mobility status PT Visit Diagnosis: Difficulty in walking, not elsewhere classified (R26.2);Muscle weakness (generalized) (M62.81);Pain     Time: 1350-1416 PT Time Calculation (min) (ACUTE ONLY): 26 min  Charges:  $Gait Training: 8-22 mins $Therapeutic Activity: 8-22 mins                     Jetta Lout PTA 05/14/20, 3:58 PM

## 2020-05-15 MED ORDER — DILTIAZEM HCL ER COATED BEADS 120 MG PO CP24: 120.0000 mg | ORAL_CAPSULE | Freq: Every day | ORAL | Status: AC

## 2020-05-15 MED ORDER — LIDOCAINE 5 % EX PTCH: 1.0000 | MEDICATED_PATCH | CUTANEOUS | 0 refills | Status: DC

## 2020-05-15 MED ORDER — FUROSEMIDE 20 MG PO TABS: 20.0000 mg | ORAL_TABLET | Freq: Every day | ORAL | Status: AC

## 2020-05-15 NOTE — Progress Notes (Signed)
Physical Therapy Treatment Patient Details Name: Tracy Singh MRN: [id number] DOB: 1924/01/31 Today's Date: 05/15/2020    History of Present Illness Pt is a 87 y/o F with PMH that includes Afib and Von Willebrand disease. Pt presented with chief c/o low back pain (stated started 6WA and was started on muscle relaxers by Martha Clan). Pt with increased pain starting on 4/11 and presented to ED. L-spine XR from 4/12 revealed L2 compression fracture of unknown duration. Pt recommended to wear LSO with ambulation and is currently being managed conservatively, but neuro sx consulted for potential kyphoplasty if pt fails conservative management.    PT Comments    Pt was long sitting in bed upon arriving. He agrees to PT session and is cooperative and pleasant throughout. Did review spinal precautions with pt. Continues to require min assist to exit bed. Stood with CGA and ambulate 3 lap in hallway with CGA. No LOB or unsteadiness but does endorse fatigue. Pt is planning to DC to SNF this afternoon. She was sitting in recliner with BLEs elevated and call bell and family in room.    Follow Up Recommendations  SNF;Supervision/Assistance - 24 hour;Other (comment) (Family requesting SNF for increased PT/OT)     Equipment Recommendations  Other (comment) (defer to next level of care)       Precautions / Restrictions Precautions Precautions: Back;Fall Precaution Booklet Issued: Yes (comment) Required Braces or Orthoses: Spinal Brace Spinal Brace: Other (comment);Thoracolumbosacral orthotic Spinal Brace Comments: BLT Restrictions Weight Bearing Restrictions: No    Mobility  Bed Mobility Overal bed mobility: Needs Assistance Bed Mobility: Rolling;Sidelying to Sit;Supine to Sit Rolling: Min guard Sidelying to sit: Min assist Supine to sit: Min assist          Transfers Overall transfer level: Needs assistance Equipment used: Rolling walker (2 wheeled) Transfers: Sit to/from  Stand Sit to Stand: Min guard    Ambulation/Gait Ambulation/Gait assistance: Land (Feet): 600 Feet Assistive device: Rolling walker (2 wheeled) Gait Pattern/deviations: Step-through pattern;Decreased step length - right;Decreased step length - left Gait velocity: decreased   General Gait Details: Pt was able to ambulate 3 laps in hallway with RW       Balance Overall balance assessment: Needs assistance Sitting-balance support: No upper extremity supported;Feet supported Sitting balance-Leahy Scale: Good     Standing balance support: Bilateral upper extremity supported;During functional activity Standing balance-Leahy Scale: Fair Standing balance comment: reliant on BUE support       Cognition Arousal/Alertness: Awake/alert Behavior During Therapy: WFL for tasks assessed/performed Overall Cognitive Status: Within Functional Limits for tasks assessed        General Comments: Pt A&Ox4.         General Comments General comments (skin integrity, edema, etc.): reviewed BLT and discussed what to expect at rehab going forward.      Pertinent Vitals/Pain Pain Assessment: 0-10 Pain Score: 2  Faces Pain Scale: Hurts a little bit Pain Location: LBP with mobilization Pain Descriptors / Indicators: Discomfort;Grimacing;Moaning Pain Intervention(s): Limited activity within patient's tolerance;Premedicated before session;Monitored during session;Repositioned           PT Goals (current goals can now be found in the care plan section) Acute Rehab PT Goals Patient Stated Goal: return home once able Progress towards PT goals: Progressing toward goals    Frequency    7X/week      PT Plan Current plan remains appropriate       AM-PAC PT "6 Clicks" Mobility   Outcome Measure  Help  needed turning from your back to your side while in a flat bed without using bedrails?: A Little Help needed moving from lying on your back to sitting on the side of a  flat bed without using bedrails?: A Little Help needed moving to and from a bed to a chair (including a wheelchair)?: A Little Help needed standing up from a chair using your arms (e.g., wheelchair or bedside chair)?: A Little Help needed to walk in hospital room?: A Little Help needed climbing 3-5 steps with a railing? : A Little 6 Click Score: 18    End of Session Equipment Utilized During Treatment: Back brace;Gait belt Activity Tolerance: Patient tolerated treatment well Patient left: in chair;with chair alarm set;with call bell/phone within reach;with family/visitor present Nurse Communication: Mobility status PT Visit Diagnosis: Difficulty in walking, not elsewhere classified (R26.2);Muscle weakness (generalized) (M62.81);Pain     Time: 3903-0092 PT Time Calculation (min) (ACUTE ONLY): 19 min  Charges:  $Gait Training: 8-22 mins                     Jetta Lout PTA 05/15/20, 12:08 PM

## 2020-05-15 NOTE — Plan of Care (Signed)
Axox4. HOH. Calm and cooperative and able to voice her needs. Pt takes pills whole one at a time. On RA. Vitals stable. Safety measures in place. Will continue to monitor.  Problem: Education: Goal: Knowledge of General Education information will improve Description: Including pain rating scale, medication(s)/side effects and non-pharmacologic comfort measures Outcome: Progressing   Problem: Health Behavior/Discharge Planning: Goal: Ability to manage health-related needs will improve Outcome: Progressing   Problem: Clinical Measurements: Goal: Ability to maintain clinical measurements within normal limits will improve Outcome: Progressing Goal: Will remain free from infection Outcome: Progressing Goal: Diagnostic test results will improve Outcome: Progressing Goal: Respiratory complications will improve Outcome: Progressing Goal: Cardiovascular complication will be avoided Outcome: Progressing   Problem: Activity: Goal: Risk for activity intolerance will decrease Outcome: Progressing   Problem: Nutrition: Goal: Adequate nutrition will be maintained Outcome: Progressing   Problem: Coping: Goal: Level of anxiety will decrease Outcome: Progressing   Problem: Elimination: Goal: Will not experience complications related to bowel motility Outcome: Progressing Goal: Will not experience complications related to urinary retention Outcome: Progressing   Problem: Pain Managment: Goal: General experience of comfort will improve Outcome: Progressing   Problem: Safety: Goal: Ability to remain free from injury will improve Outcome: Progressing   Problem: Skin Integrity: Goal: Risk for impaired skin integrity will decrease Outcome: Progressing

## 2020-05-15 NOTE — Care Management Important Message (Signed)
Important Message  Patient Details  Name: KESSIE CROSTON MRN: [id number] Date of Birth: 1924-06-14   Medicare Important Message Given:  Yes     Johnell Comings 05/15/2020, 11:59 AM

## 2020-05-15 NOTE — Progress Notes (Signed)
2 iv's dc'd tip intact.pt had a bm and was cleaned and a brief was placed on her for transport. Pt's belongings were packed by her daughter. All questions answered. An AVS was also given to her daughter. RN attempted to call report to [phone], no answer, rn was on hold / ringing for 5 minutes. RN will attempt to call report again at a later time. Pt awaiting ems for transport to facility.

## 2020-05-15 NOTE — TOC Transition Note (Signed)
Transition of Care [facility]) - CM/SW Discharge Note   Patient Details  Name: Tracy Singh MRN: [id number] Date of Birth: September 17, 1924  Transition of Care [facility]) CM/SW Contact:  Maree Krabbe, LCSW Phone Number: 05/15/2020, 12:26 PM   Clinical Narrative:   Clinical Social Worker facilitated patient discharge including contacting patient family and facility to confirm patient discharge plans.  Clinical information faxed to facility and family agreeable with plan.  CSW arranged ambulance transport via [facility] to [facility] .  RN to call [phone] for report prior to discharge.    Final next level of care: Skilled Nursing Facility Barriers to Discharge: No Barriers Identified   Patient Goals and CMS Choice Patient states their goals for this hospitalization and ongoing recovery are:: To SNF per daughter, [facility] CMS Medicare.gov Compare Post Acute Care list provided to:: Patient Represenative (must comment) (Daughter) Choice offered to / list presented to : Adult Children  Discharge Placement              Patient chooses bed at:  ([facility]) Patient to be transferred to facility by: [facility]   Patient and family notified of of transfer: 05/15/20  Discharge Plan and Services     Post Acute Care Choice: Skilled Nursing Facility                               Social Determinants of Health (SDOH) Interventions     Readmission Risk Interventions No flowsheet data found.

## 2020-05-15 NOTE — Discharge Summary (Signed)
Physician Discharge Summary  Tracy Singh ZOX:[id number] DOB: [birth year] DOA: 05/05/2020  PCP: Reubin Milan, MD  Admit date: 05/05/2020 Discharge date: 05/15/2020  Admitted From: Home Disposition:  SNF  Recommendations for Outpatient Follow-up:  1. Follow up with PCP in 1-2 weeks 2. Follow-up with cardiology in 1 to 2 weeks 3. Follow-up with neurosurgery as directed, they will arrange  Home Health: No Equipment/Devices: None Discharge Condition: Stable CODE STATUS: Full Diet recommendation: Heart healthy Brief/Interim Summary: Tracy Lona Robitailleis an 87 y.o.Caucasian femaleewith medical history significant foratrial fibrillation on Pradaxa and von Willebrand disease, who presented to the emergency room with acute onset of low back pain for which she was recently seen by Dr. Martha Clan and prescribed Robaxin with mild help. She has been having urinary frequency and urgency and felt to have a UTI. She was seen by her cardiologist and given p.o. Cipro.She does have bilateral lower extremity edema. Her pulse extremities dropped to 85% on room air in the ER and she was placed on 2 L of O2 by nasal cannula  4/15-PT rec. SNF. Brace here. Pt with no new complaints. No overnight issues. On telemetry with physical therapy heart rate increased to the 120s. Given extra dose of Toprol-XL 25. 4/16-heart rate from 102 to 110 at rest in bed. Patient denies any dizziness, chest pain or shortness of breath 4/17- while I was in room pt working with PT, as she got up HR 130's in afib. NSg reported pt's daughter did not want her to get beta blk at scheduled am time but to be given at 12 PM as she takes it at home. Also found out had ordered Toprol-XL twice daily but daughter had questions now and wanted it once a day. 4/18- HR at rest 80's. 4/19-no overnight issues. No complaints 4/20: No clinical status changes.  Discussed with patient's daughters.  They [facility].  Insurance authorization initiated. 4/21: No clinical status changes.  Daughter at bedside.  Pending insurance authorization. 4/22: Insurance authorization obtained.  Patient discharged to skilled nursing facility in stable condition.  Discharge Diagnoses:  Active Problems:   Atrial fibrillation with rapid ventricular response (HCC) Atrial fibrillation with rapid ventricular response Rate control is been challenging Echo normal EF Cardiology was following Made medication adjustments until good rate control Discharge plan Resume metoprolol XL 50 mg daily Continue Cardizem CD 120 mg daily Continue Pradaxa Outpatient cardiology follow-up Home lisinopril and hydrochlorothiazide have been held  Acute on chronic DHF- more euvolemic on exam Improved  Continue A. fib control  Continue beta-blockers and Lasix  Home lisinopril and HCTZ held  L2 compression fracture possibly pathological secondary to osteoporosis. Continue with pain management  Neurosurgery consulted, input was appreciated 4/19-LSO when out of bed  [facility] Continue PT Follow-up with neurosurgery as outpatient-Dr. Marcell Barlow to arrange  Acute respiratory failure with hypoxia, likely secondary to acute DHF. Improved, now on room air Continue incentive spirometer Keep O2 sat above 92%  Hypertensive urgency, likely contributing to her acute CHF. improved Continue to monitor  Recent UTI status post treatment with Cipro. -Urinalysis currently negative. She finished taking Cipro.   Discharge Instructions  Discharge Instructions    Diet - low sodium heart healthy   Complete by: As directed    Increase activity slowly   Complete by: As directed      Allergies as of 05/15/2020      Reactions   Red Dye Swelling   Pts daughter  states is not allergic to red dye, states has red jello, and other red dye items with no reaction.   Yellow Dyes (non-tartrazine) Swelling    Pts daughter states is not allergic to yellow dye and has it at home.      Medication List    STOP taking these medications   ciprofloxacin 500 MG tablet Commonly known as: CIPRO   hydrochlorothiazide 25 MG tablet Commonly known as: HYDRODIURIL   lisinopril 5 MG tablet Commonly known as: ZESTRIL     TAKE these medications   diltiazem 120 MG 24 hr capsule Commonly known as: CARDIZEM CD Take 1 capsule (120 mg total) by mouth daily.   furosemide 20 MG tablet Commonly known as: LASIX Take 1 tablet (20 mg total) by mouth daily. Start taking on: May 16, 2020   lidocaine 5 % Commonly known as: LIDODERM Place 1 patch onto the skin daily. Remove & Discard patch within 12 hours or as directed by MD   loperamide 2 MG capsule Commonly known as: IMODIUM Take by mouth as needed for diarrhea or loose stools.   metoprolol succinate 50 MG 24 hr tablet Commonly known as: TOPROL-XL Take 50 mg by mouth daily.   multivitamin capsule Take 1 capsule by mouth daily.   Pradaxa 150 MG Caps capsule Generic drug: dabigatran Take 150 mg by mouth. bid       Contact information for follow-up providers    Dalia Heading, MD. Go in 1 week(s).   Specialty: Cardiology Contact information: [address] [place] [address] [phone]            Contact information for after-discharge care    Destination    [facility] Preferred SNF .   Service: Skilled Nursing Contact information: [address] [place] [address] [phone]                 Allergies  Allergen Reactions  . Red Dye Swelling    Pts daughter states is not allergic to red dye, states has red jello, and other red dye items with no reaction.  . Yellow Dyes (Non-Tartrazine) Swelling    Pts daughter states is not allergic to yellow dye and has it at home.    Consultations:  Cardiology-[facility]  Neurosurgery-Dr. Marcell Barlow   Procedures/Studies: DG  Thoracic Spine 2 View  Result Date: 05/05/2020 CLINICAL DATA:  Back pain EXAM: THORACIC SPINE 2 VIEWS COMPARISON:  None. FINDINGS: The osseous structures are diffusely osteopenic. Normal thoracic kyphosis. No listhesis. No acute fracture of the thoracic spine. Vertebral body height appears preserved. The paraspinal soft tissues are unremarkable. L2 superior endplate fracture again noted. IMPRESSION: No definite acute fracture or listhesis of the thoracic spine. Electronically Signed   By: Helyn Numbers MD   On: 05/05/2020 02:34   DG Lumbar Spine Complete  Result Date: 05/05/2020 CLINICAL DATA:  Back pain EXAM: LUMBAR SPINE - COMPLETE 4+ VIEW COMPARISON:  None. FINDINGS: Five view radiograph lumbar spine demonstrates normal lumbar lordosis. There is grade 1 anterolisthesis of L4 upon L5. Superior endplate fracture of L2 is noted, age indeterminate, with approximately 30-40% loss of height. This appears new since prior chest radiograph of 04/01/2018. Remaining vertebral body height has been preserved. There is intervertebral disc space narrowing at L4-5 in keeping with changes of moderate degenerative disc disease at this level. Remaining intervertebral disc heights are preserved. The paraspinal soft tissues are unremarkable. Vascular calcifications noted within the abdominal aorta. Cholelithiasis is incidentally  noted. IMPRESSION: Age-indeterminate L2 superior endplate fracture with approximately 30-40% loss of height. Moderate degenerative disc disease L4-5 with cystic grade 1 anterolisthesis. Cholelithiasis Electronically Signed   By: Helyn Numbers MD   On: 05/05/2020 02:31   CT Angio Chest PE W and/or Wo Contrast  Result Date: 05/05/2020 CLINICAL DATA:  87 year old female with low back pain and hypoxia. EXAM: CT ANGIOGRAPHY CHEST WITH CONTRAST TECHNIQUE: Multidetector CT imaging of the chest was performed using the standard protocol during bolus administration of intravenous contrast. Multiplanar CT  image reconstructions and MIPs were obtained to evaluate the vascular anatomy. CONTRAST:  Seventy-five mL Omnipaque 350, intravenous COMPARISON:  01/25/2010 FINDINGS: Cardiovascular: Satisfactory opacification of the pulmonary arteries to the segmental level. No evidence of pulmonary embolism. Severe biatrial cardiomegaly, advanced from 2012 comparison. No pericardial effusion. Aortic atherosclerotic calcifications. Coronary atherosclerotic calcifications. Mediastinum/Nodes: No enlarged mediastinal, hilar, or axillary lymph nodes. Thyroid gland, trachea, and esophagus demonstrate no significant findings. Lungs/Pleura: Bibasilar subsegmental atelectasis. Trace bilateral pleural effusions versus dependent pleural thickening. Mild centrilobular emphysema. Similar appearing apical scarring with calcification. No suspicious pulmonary nodules. No pneumothorax. Upper Abdomen: No acute abnormality. Similar appearing multifocal simple hepatic cysts. Similar appearing simple cyst arising from the medial cortex of the left upper pole kidney. Small hiatal hernia. Musculoskeletal: No acute fracture. No aggressive appearing osseous lesion. Diffuse osteopenia. Review of the MIP images confirms the above findings. IMPRESSION: Vascular: 1. No evidence of pulmonary embolism. 2. Severe biatrial cardiomegaly. 3. Coronary and aortic atherosclerosis (ICD10-I70.0). Non-Vascular: Bibasilar subsegmental atelectasis. Marliss Coots, MD [facility] Electronically Signed   By: Marliss Coots MD   On: 05/05/2020 07:57   MR LUMBAR SPINE WO CONTRAST  Result Date: 05/06/2020 CLINICAL DATA:  Initial evaluation for acute trauma, back pain. EXAM: MRI LUMBAR SPINE WITHOUT CONTRAST TECHNIQUE: Multiplanar, multisequence MR imaging of the lumbar spine was performed. No intravenous contrast was administered. COMPARISON:  Prior radiograph from 05/05/2020 FINDINGS: Segmentation: Standard. Lowest  well-formed disc space labeled the L5-S1 level. Alignment: Trace 2 mm anterolisthesis of L3 on L4, with 7 mm anterolisthesis of L4 on L5. Findings chronic and facet mediated. Alignment otherwise normal with preservation of the normal lumbar lordosis. Vertebrae: Acute compression fracture involving the superior endplate of L1. Associated central height loss of up to 20% without bony retropulsion. Additional acute compression fracture involving the superior endplate of L2. Associated 30% height loss with trace 2 mm bony retropulsion. These fractures are benign/mechanical in appearance. Otherwise, vertebral body height maintained with no other acute or chronic fracture. Underlying bone marrow signal intensity within normal limits. No discrete or worrisome osseous lesions. Mild reactive marrow edema noted about the right L4-5 facet due to facet arthritis. No other abnormal marrow edema. Conus medullaris and cauda equina: Conus extends to the L1-2 level. Conus and cauda equina appear normal. Paraspinal and other soft tissues: Mild paraspinous edema adjacent to the L1 and L2 compression fractures. Mild edema noted within the lower posterior paraspinous musculature as well, likely reflecting a degree of associated muscular injury/strain. Few benign appearing cyst noted within the kidneys bilaterally. Visualized visceral structures otherwise unremarkable. Disc levels: T12-L1: Mild disc bulge with disc desiccation. Superimposed small right foraminal to extraforaminal disc protrusion (series 8, image 3). No significant spinal stenosis. Foramina remain patent. L1-2: Mild disc bulge with disc desiccation. Trace 2 mm bony retropulsion related to the L2 compression fracture. Mild facet hypertrophy. No significant spinal stenosis. Foramina remain patent. L2-3: Diffuse disc bulge with disc desiccation. Mild  facet and ligament flavum hypertrophy. No more than mild narrowing of the lateral recesses bilaterally. Central canal remains  patent. No significant foraminal stenosis. L3-4: Trace anterolisthesis. Mild disc bulge with disc desiccation. Mild facet and ligament flavum hypertrophy. No significant spinal stenosis. Mild left L3 foraminal narrowing. Right neural foramen remains patent. L4-5: 7 mm anterolisthesis. Associated broad posterior pseudo disc uncovering, asymmetric to the left. Moderate facet and ligament flavum hypertrophy. Resultant mild to moderate left greater than right lateral recess stenosis. Central canal remains patent. Mild to moderate left greater than right L4 foraminal narrowing. L5-S1: Mild disc bulge with disc desiccation. Moderate right with mild left facet hypertrophy. No significant spinal stenosis. Mild right L5 foraminal narrowing. Left neural foramina remains patent. IMPRESSION: 1. Acute compression fracture involving the superior endplate of L1 with associated 20% height loss without bony retropulsion. 2. Acute compression fracture involving the superior endplate of L2 with associated 30% height loss with trace 2 mm bony retropulsion. No significant stenosis. 3. 7 mm anterolisthesis of L4 on L5 with associated disc bulge and facet hypertrophy, resulting in mild to moderate left greater than right lateral recess and foraminal stenosis. Electronically Signed   By: Rise Mu M.D.   On: 05/06/2020 21:20   US Venous Img Lower Unilateral Right (DVT)  Result Date: 05/05/2020 CLINICAL DATA:  Right lower extremity pain and edema. EXAM: RIGHT LOWER EXTREMITY VENOUS DOPPLER ULTRASOUND TECHNIQUE: Gray-scale sonography with graded compression, as well as color Doppler and duplex ultrasound were performed to evaluate the lower extremity deep venous systems from the level of the common femoral vein and including the common femoral, femoral, profunda femoral, popliteal and calf veins including the posterior tibial, peroneal and gastrocnemius veins when visible. The superficial great saphenous vein was also  interrogated. Spectral Doppler was utilized to evaluate flow at rest and with distal augmentation maneuvers in the common femoral, femoral and popliteal veins. COMPARISON:  None. FINDINGS: Contralateral Common Femoral Vein: Respiratory phasicity is normal and symmetric with the symptomatic side. No evidence of thrombus. Normal compressibility. Common Femoral Vein: No evidence of thrombus. Normal compressibility, respiratory phasicity and response to augmentation. Saphenofemoral Junction: No evidence of thrombus. Normal compressibility and flow on color Doppler imaging. Profunda Femoral Vein: No evidence of thrombus. Normal compressibility and flow on color Doppler imaging. Femoral Vein: No evidence of thrombus. Normal compressibility, respiratory phasicity and response to augmentation. Popliteal Vein: No evidence of thrombus. Normal compressibility, respiratory phasicity and response to augmentation. Calf Veins: No evidence of thrombus. Normal compressibility and flow on color Doppler imaging. Superficial Great Saphenous Vein: No evidence of thrombus. Normal compressibility. Venous Reflux:  None. Other Findings: No evidence of superficial thrombophlebitis or abnormal fluid collection. IMPRESSION: No evidence of right lower extremity deep venous thrombosis. Electronically Signed   By: Irish Lack M.D.   On: 05/05/2020 12:43   DG Chest Portable 1 View  Result Date: 05/05/2020 CLINICAL DATA:  Tachycardia EXAM: PORTABLE CHEST 1 VIEW COMPARISON:  04/01/2018 FINDINGS: Background interstitial prominence is again identified likely reflecting changes of underlying chronic interstitial lung disease. No superimposed confluent pulmonary infiltrates. No pneumothorax or pleural effusion. Biapical pleural scarring again noted, unchanged. Mild cardiomegaly is stable. The pulmonary vascularity is normal. No acute bone abnormality. IMPRESSION: Stable cardiomegaly. No radiographic evidence of acute cardiopulmonary disease.  Electronically Signed   By: Helyn Numbers MD   On: 05/05/2020 02:28   ECHOCARDIOGRAM COMPLETE  Result Date: 05/06/2020    ECHOCARDIOGRAM REPORT   Patient Name:   Tracy Singh [facility]  Date of Exam: 05/06/2020 Medical Rec #:  [id number]            Height:       64.0 in Accession #:    [id number]           Weight:       149.0 lb Date of Birth:  04/06/1924            BSA:          1.726 m Patient Age:    87 years             BP:           122/48 mmHg Patient Gender: F                    HR:           104 bpm. Exam Location:  [facility] Procedure: 2D Echo, Color Doppler and Cardiac Doppler Indications:     I48.91 Atrial fibrillation  History:         Patient has no prior history of Echocardiogram examinations.                  Sepsis.  Sonographer:     Humphrey RollsJoan Heiss RDCS (AE) Referring Phys:  [id number] Vernetta HoneyJAN A MANSY Diagnosing Phys: Marcina MillardAlexander Paraschos MD  Sonographer Comments: Suboptimal apical window and suboptimal subcostal window. IMPRESSIONS  1. Left ventricular ejection fraction, by estimation, is 60 to 65%. The left ventricle has normal function. The left ventricle has no regional wall motion abnormalities. Left ventricular diastolic parameters are indeterminate.  2. Right ventricular systolic function is normal. The right ventricular size is normal.  3. The mitral valve is normal in structure. Mild to moderate mitral valve regurgitation. No evidence of mitral stenosis.  4. The aortic valve is normal in structure. Aortic valve regurgitation is not visualized. No aortic stenosis is present.  5. The inferior vena cava is normal in size with greater than 50% respiratory variability, suggesting right atrial pressure of 3 mmHg. FINDINGS  Left Ventricle: Left ventricular ejection fraction, by estimation, is 60 to 65%. The left ventricle has normal function. The left ventricle has no regional wall motion abnormalities. The left ventricular internal cavity size was normal in size. There is  no left ventricular hypertrophy. Left  ventricular diastolic parameters are indeterminate. Right Ventricle: The right ventricular size is normal. No increase in right ventricular wall thickness. Right ventricular systolic function is normal. Left Atrium: Left atrial size was normal in size. Right Atrium: Right atrial size was normal in size. Pericardium: Trivial pericardial effusion is present. Mitral Valve: The mitral valve is normal in structure. Mild to moderate mitral valve regurgitation. No evidence of mitral valve stenosis. MV peak gradient, 9.6 mmHg. The mean mitral valve gradient is 4.0 mmHg. Tricuspid Valve: The tricuspid valve is normal in structure. Tricuspid valve regurgitation is mild . No evidence of tricuspid stenosis. Aortic Valve: The aortic valve is normal in structure. Aortic valve regurgitation is not visualized. No aortic stenosis is present. Aortic valve mean gradient measures 3.0 mmHg. Aortic valve peak gradient measures 6.2 mmHg. Aortic valve area, by VTI measures 2.06 cm. Pulmonic Valve: The pulmonic valve was normal in structure. Pulmonic valve regurgitation is not visualized. No evidence of pulmonic stenosis. Aorta: The aortic root is normal in size and structure. Venous: The inferior vena cava is normal in size with greater than 50% respiratory variability, suggesting right atrial pressure of 3 mmHg. IAS/Shunts: No atrial level shunt detected  by color flow Doppler.  LEFT VENTRICLE PLAX 2D LVIDd:         4.50 cm  Diastology LVIDs:         2.90 cm  LV e' medial:    8.05 cm/s LV PW:         1.10 cm  LV E/e' medial:  16.5 LV IVS:        0.80 cm  LV e' lateral:   11.30 cm/s LVOT diam:     1.80 cm  LV E/e' lateral: 11.7 LV SV:         45 LV SV Index:   26 LVOT Area:     2.54 cm  RIGHT VENTRICLE RV Basal diam:  3.40 cm LEFT ATRIUM           Index LA diam:      4.20 cm 2.43 cm/m LA Vol (A2C): 38.2 ml 22.13 ml/m LA Vol (A4C): 91.8 ml 53.18 ml/m  AORTIC VALVE                   PULMONIC VALVE AV Area (Vmax):    2.04 cm    PV Vmax:        0.83 m/s AV Area (Vmean):   2.13 cm    PV Vmean:      55.900 cm/s AV Area (VTI):     2.06 cm    PV VTI:        0.152 m AV Vmax:           125.00 cm/s PV Peak grad:  2.8 mmHg AV Vmean:          87.400 cm/s PV Mean grad:  1.0 mmHg AV VTI:            0.217 m AV Peak Grad:      6.2 mmHg AV Mean Grad:      3.0 mmHg LVOT Vmax:         100.00 cm/s LVOT Vmean:        73.300 cm/s LVOT VTI:          0.176 m LVOT/AV VTI ratio: 0.81  AORTA Ao Root diam: 2.70 cm MITRAL VALVE                TRICUSPID VALVE MV Area (PHT): 3.97 cm     TR Peak grad:   26.4 mmHg MV Area VTI:   1.77 cm     TR Vmax:        257.00 cm/s MV Peak grad:  9.6 mmHg MV Mean grad:  4.0 mmHg     SHUNTS MV Vmax:       1.55 m/s     Systemic VTI:  0.18 m MV Vmean:      85.4 cm/s    Systemic Diam: 1.80 cm MV Decel Time: 191 msec MV E velocity: 132.50 cm/s Marcina Millard MD Electronically signed by Marcina Millard MD Signature Date/Time: 05/06/2020/12:09:45 PM    Final     (Echo, Carotid, EGD, Colonoscopy, ERCP)    Subjective: Patient seen and examined the day of discharge.  Resting comfortably.  Tolerating food and medication intake without issue.  Pain well controlled.  Medically stable for discharge  Discharge Exam: Vitals:   05/15/20 0541 05/15/20 0948  BP: 130/73 125/64  Pulse: 72 93  Resp: 18   Temp: 98.6 F (37 C) 97.7 F (36.5 C)  SpO2: 98% 99%   Vitals:   05/14/20 1932 05/14/20 2320 05/15/20 0541 05/15/20 0948  BP:  124/68 (!) 156/85 130/73 125/64  Pulse: 74 73 72 93  Resp: 18 18 18    Temp: 97.8 F (36.6 C) 97.8 F (36.6 C) 98.6 F (37 C) 97.7 F (36.5 C)  TempSrc: Oral Oral  Oral  SpO2: 97% 99% 98% 99%  Weight:      Height:        General: Pt is alert, awake, not in acute distress Cardiovascular: RRR, S1/S2 +, no rubs, no gallops Respiratory: CTA bilaterally, no wheezing, no rhonchi Abdominal: Soft, NT, ND, bowel sounds + Extremities: no edema, no cyanosis    The results of significant diagnostics  from this hospitalization (including imaging, microbiology, ancillary and laboratory) are listed below for reference.     Microbiology: No results found for this or any previous visit (from the past 240 hour(s)).   Labs: BNP (last 3 results) Recent Labs    05/05/20 0045 05/07/20 0613 05/09/20 0524  BNP 724.5* 300.2* 178.2*   Basic Metabolic Panel: Recent Labs  Lab 05/09/20 0524 05/10/20 0532 05/13/20 0523  NA 132* 132* 134*  K 3.7 3.7 4.1  CL 90* 90* 95*  CO2 31 30 29   GLUCOSE 116* 102* 108*  BUN 15 16 14   CREATININE 0.80 0.66 0.67  CALCIUM 8.9 9.0 9.1   Liver Function Tests: No results for input(s): AST, ALT, ALKPHOS, BILITOT, PROT, ALBUMIN in the last 168 hours. No results for input(s): LIPASE, AMYLASE in the last 168 hours. No results for input(s): AMMONIA in the last 168 hours. CBC: Recent Labs  Lab 05/13/20 0523  WBC 5.7  HGB 14.8  HCT 42.4  MCV 90.6  PLT 308   Cardiac Enzymes: No results for input(s): CKTOTAL, CKMB, CKMBINDEX, TROPONINI in the last 168 hours. BNP: Invalid input(s): POCBNP CBG: No results for input(s): GLUCAP in the last 168 hours. D-Dimer No results for input(s): DDIMER in the last 72 hours. Hgb A1c No results for input(s): HGBA1C in the last 72 hours. Lipid Profile No results for input(s): CHOL, HDL, LDLCALC, TRIG, CHOLHDL, LDLDIRECT in the last 72 hours. Thyroid function studies No results for input(s): TSH, T4TOTAL, T3FREE, THYROIDAB in the last 72 hours.  Invalid input(s): FREET3 Anemia work up No results for input(s): VITAMINB12, FOLATE, FERRITIN, TIBC, IRON, RETICCTPCT in the last 72 hours. Urinalysis    Component Value Date/Time   COLORURINE YELLOW (A) 05/05/2020 0300   APPEARANCEUR CLEAR (A) 05/05/2020 0300   LABSPEC 1.013 05/05/2020 0300   PHURINE 6.0 05/05/2020 0300   GLUCOSEU NEGATIVE 05/05/2020 0300   HGBUR SMALL (A) 05/05/2020 0300   BILIRUBINUR NEGATIVE 05/05/2020 0300   KETONESUR 5 (A) 05/05/2020 0300    PROTEINUR NEGATIVE 05/05/2020 0300   NITRITE NEGATIVE 05/05/2020 0300   LEUKOCYTESUR NEGATIVE 05/05/2020 0300   Sepsis Labs Invalid input(s): PROCALCITONIN,  WBC,  LACTICIDVEN Microbiology No results found for this or any previous visit (from the past 240 hour(s)).   Time coordinating discharge: Over 30 minutes  SIGNED:   07/05/2020, MD  [facility] 05/15/2020, 11:15 AM Pager   If 7PM-7AM, please contact night-coverage

## 2020-05-15 NOTE — Discharge Instructions (Signed)

## 2020-06-13 IMAGING — DX DG CHEST 1V PORT
1 series · 1 of 1 positions shown · non-contrast
Comparison: 01/27/2010

CLINICAL DATA: Productive cough

EXAM:
PORTABLE CHEST 1 VIEW

[chest ap]
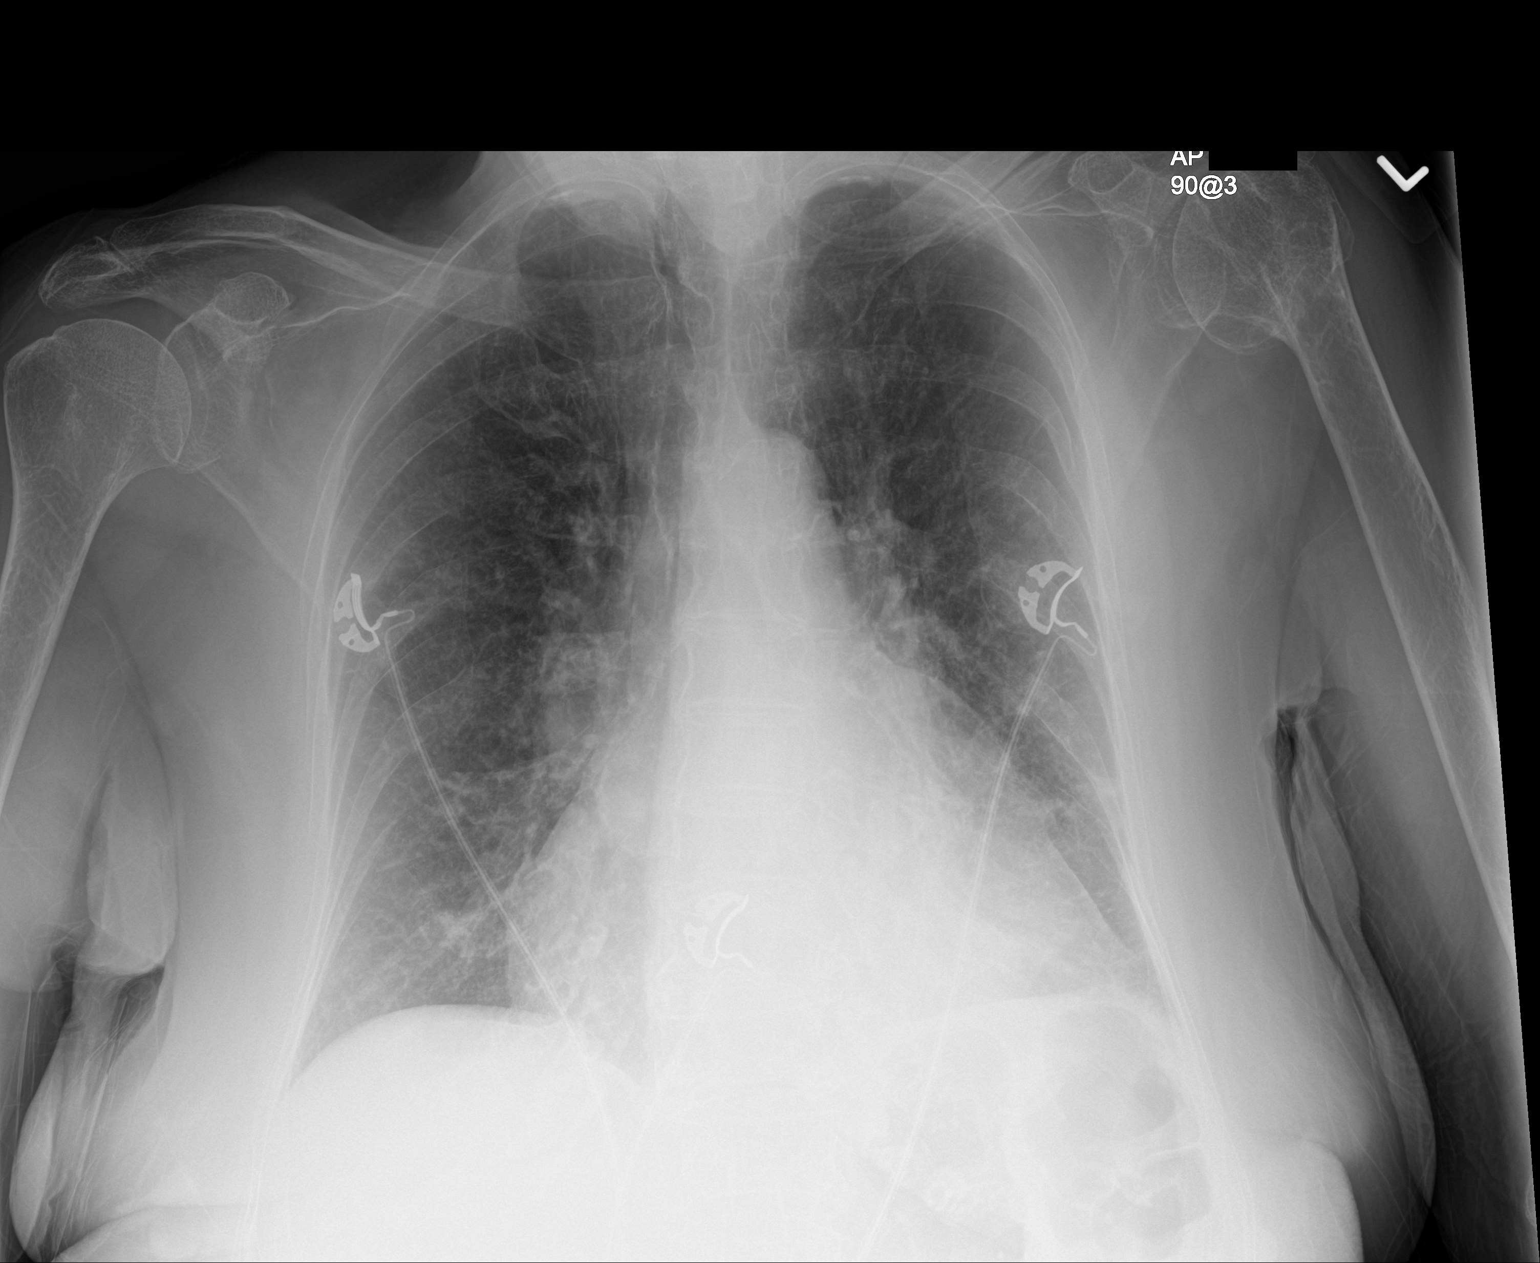

[1 of 1 positions shown; findings below may reference images not displayed]

FINDINGS: Cardiomegaly. Left basilar airspace opacity noted, similar prior
study, likely scarring. Right lung clear. No effusions or acute bony
abnormality.
IMPRESSION: Cardiomegaly.  Left basilar scarring.  No active disease.

## 2020-08-31 ENCOUNTER — Emergency Department
Admission: EM | Admit: 2020-08-31 | Discharge: 2020-08-31 | Disposition: A | Discharge status: Home / Self Care | Payer: Medicare HMO | Attending: Emergency Medicine | Admitting: Emergency Medicine

## 2020-08-31 ENCOUNTER — Emergency Department: Payer: Medicare HMO

## 2020-08-31 DIAGNOSIS — Y92009 Unspecified place in unspecified non-institutional (private) residence as the place of occurrence of the external cause: Secondary | ICD-10-CM | POA: Insufficient documentation

## 2020-08-31 DIAGNOSIS — I509 Heart failure, unspecified: Secondary | ICD-10-CM | POA: Diagnosis not present

## 2020-08-31 DIAGNOSIS — Z79899 Other long term (current) drug therapy: Secondary | ICD-10-CM | POA: Diagnosis not present

## 2020-08-31 DIAGNOSIS — I110 Hypertensive heart disease with heart failure: Secondary | ICD-10-CM | POA: Diagnosis not present

## 2020-08-31 DIAGNOSIS — S0990XA Unspecified injury of head, initial encounter: Secondary | ICD-10-CM | POA: Insufficient documentation

## 2020-08-31 DIAGNOSIS — W19XXXA Unspecified fall, initial encounter: Secondary | ICD-10-CM | POA: Insufficient documentation

## 2020-08-31 DIAGNOSIS — M25561 Pain in right knee: Secondary | ICD-10-CM | POA: Insufficient documentation

## 2020-08-31 DIAGNOSIS — M25562 Pain in left knee: Secondary | ICD-10-CM | POA: Insufficient documentation

## 2020-08-31 DIAGNOSIS — I482 Chronic atrial fibrillation, unspecified: Secondary | ICD-10-CM | POA: Insufficient documentation

## 2020-08-31 DIAGNOSIS — T1490XA Injury, unspecified, initial encounter: Secondary | ICD-10-CM | POA: Diagnosis present

## 2020-08-31 DIAGNOSIS — Z87891 Personal history of nicotine dependence: Secondary | ICD-10-CM | POA: Diagnosis not present

## 2020-08-31 DIAGNOSIS — R609 Edema, unspecified: Secondary | ICD-10-CM | POA: Insufficient documentation

## 2020-08-31 LAB — COMPREHENSIVE METABOLIC PANEL
ALT: 19 U/L (ref 0–44)
AST: 31 U/L (ref 15–41)
Albumin: 3.6 g/dL (ref 3.5–5.0)
Alkaline Phosphatase: 120 U/L (ref 38–126)
Anion gap: 8 (ref 5–15)
BUN: 15 mg/dL (ref 8–23)
CO2: 28 mmol/L (ref 22–32)
Calcium: 8.8 mg/dL — ABNORMAL LOW (ref 8.9–10.3)
Chloride: 94 mmol/L — ABNORMAL LOW (ref 98–111)
Creatinine, Ser: 0.62 mg/dL (ref 0.44–1.00)
GFR, Estimated: >60 mL/min (ref >60)
Glucose, Bld: 178 mg/dL — ABNORMAL HIGH (ref 70–99)
Potassium: 3.8 mmol/L (ref 3.5–5.1)
Sodium: 130 mmol/L — ABNORMAL LOW (ref 135–145)
Total Bilirubin: 0.7 mg/dL (ref 0.3–1.2)
Total Protein: 6.7 g/dL (ref 6.5–8.1)

## 2020-08-31 LAB — CBC WITH DIFFERENTIAL/PLATELET
Abs Immature Granulocytes: 0.07 10*3/uL (ref 0.00–0.07)
Basophils Absolute: 0 10*3/uL (ref 0.0–0.1)
Basophils Relative: 0 %
Eosinophils Absolute: 0 10*3/uL (ref 0.0–0.5)
Eosinophils Relative: 0 %
HCT: 39 % (ref 36.0–46.0)
Hemoglobin: 13.6 g/dL (ref 12.0–15.0)
Immature Granulocytes: 1 %
Lymphocytes Relative: 6 %
Lymphs Abs: 0.6 10*3/uL — ABNORMAL LOW (ref 0.7–4.0)
MCH: 32.1 pg (ref 26.0–34.0)
MCHC: 34.9 g/dL (ref 30.0–36.0)
MCV: 92 fL (ref 80.0–100.0)
Monocytes Absolute: 1.1 10*3/uL — ABNORMAL HIGH (ref 0.1–1.0)
Monocytes Relative: 10 %
Neutro Abs: 8.8 10*3/uL — ABNORMAL HIGH (ref 1.7–7.7)
Neutrophils Relative %: 83 %
Platelets: 333 10*3/uL (ref 150–400)
RBC: 4.24 MIL/uL (ref 3.87–5.11)
RDW: 13.9 % (ref 11.5–15.5)
WBC: 10.6 10*3/uL — ABNORMAL HIGH (ref 4.0–10.5)
nRBC: 0 % (ref 0.0–0.2)

## 2020-08-31 LAB — URINALYSIS, COMPLETE (UACMP) WITH MICROSCOPIC
Bilirubin Urine: NEGATIVE
Glucose, UA: NEGATIVE mg/dL
Hgb urine dipstick: NEGATIVE
Ketones, ur: NEGATIVE mg/dL
Leukocytes,Ua: NEGATIVE
Nitrite: NEGATIVE
Protein, ur: NEGATIVE mg/dL
Specific Gravity, Urine: 1.01 (ref 1.005–1.030)
pH: 7 (ref 5.0–8.0)

## 2020-08-31 MED ORDER — SODIUM CHLORIDE 0.9 % IV BOLUS: 500.0000 mL | Freq: Once | INTRAVENOUS | Status: DC

## 2020-08-31 MED ORDER — DILTIAZEM HCL 25 MG/5ML IV SOLN
15.0000 mg | Freq: Once | INTRAVENOUS | Status: DC
  Filled 2020-08-31: qty 5

## 2020-08-31 MED ORDER — ACETAMINOPHEN 325 MG PO TABS
650.0000 mg | ORAL_TABLET | Freq: Once | ORAL | Status: AC
  Administered 2020-08-31: 650 mg via ORAL
  Filled 2020-08-31: qty 2

## 2020-08-31 NOTE — ED Triage Notes (Signed)
Pt BIBA from home. Pt was found down, unwitnessed fall. Pt has been having increased difficulty walking. Pt c/o lower back pain, right leg pain.  Pt received 50 mcg of fentanyl en route.

## 2020-08-31 NOTE — ED Provider Notes (Signed)
[facility] Emergency Department Provider Note  ____________________________________________  Time seen: Approximately 9:47 AM  I have reviewed the triage vital signs and the nursing notes.   HISTORY  Chief Complaint Fall    HPI AHANA Singh is a 87 y.o. female who was brought to the ED for evaluation due to falling twice overnight.  These were not witnessed.  She has a history of atrial fibrillation, congestive heart failure, and von Willebrand disease.  Patient complains of pain in the right hip right knee and left knee.  Denies head injury or neck pain.  Daughter reports that there have been some medicine changes recently.  3 days ago the patient started Tymlos for osteoporosis, taking the injection for the first time.  She is also been getting occasional Tylenol with codeine due to chronic pain issues.  She is also been taking as needed Lasix for the past 3 days due to increased peripheral edema.  Patient denies chest pain or shortness of breath.  Daughter at bedside notes that she is very careful with patient's diet and medication regimen.  Patient has been eating and drinking normally, including 3 meals a day.  She been compliant with her medications including taking her metoprolol and Cardizem.  She took her medicines this morning.  Daughter notes the absence of any urinary symptoms or abnormal urine color or smell recently.  Musculoskeletal pains are moderate intensity, 5/10, nonradiating, worse with standing and walking, better with sitting.    Patient is hard of hearing, additional history obtained from daughter at bedside as well  Past Medical History:  Diagnosis Date   A-fib [facility])    Sepsis (HCC) 03/21/2018   Von Willebrand disease [facility])      Patient Active Problem List   Diagnosis Date Noted   Atrial fibrillation with rapid ventricular response (HCC) 05/05/2020   Diarrhea, functional 05/15/2018   Atrial fibrillation, chronic (HCC)  03/10/2015   Essential hypertension 10/06/2013   Von Willebrand disease (HCC) 10/06/2013     Past Surgical History:  Procedure Laterality Date   ABDOMINAL HYSTERECTOMY       Prior to Admission medications   Medication Sig Start Date End Date Taking? Authorizing Provider  diltiazem (CARDIZEM CD) 120 MG 24 hr capsule Take 1 capsule (120 mg total) by mouth daily. 05/15/20   Tresa Moore, MD  furosemide (LASIX) 20 MG tablet Take 1 tablet (20 mg total) by mouth daily. 05/16/20   Sreenath, Sudheer B, MD  lidocaine (LIDODERM) 5 % Place 1 patch onto the skin daily. Remove & Discard patch within 12 hours or as directed by MD 05/15/20   Tresa Moore, MD  loperamide (IMODIUM) 2 MG capsule Take by mouth as needed for diarrhea or loose stools.    [provider]  metoprolol succinate (TOPROL-XL) 50 MG 24 hr tablet Take 50 mg by mouth daily. 03/24/16   [provider]  Multiple Vitamin (MULTIVITAMIN) capsule Take 1 capsule by mouth daily.    [provider]  PRADAXA 150 MG CAPS capsule Take 150 mg by mouth. bid 02/23/18   [provider]     Allergies Red dye and Yellow dyes (non-tartrazine)   Family History  Problem Relation Age of Onset   Bladder Cancer Father    Cancer Brother     Social History Social History   Tobacco Use   Smoking status: Former    Packs/day: 0.05    Years: 6.00    Pack years: 0.30  Types: Cigarettes    Quit date: 56    Years since quitting: 65.6   Smokeless tobacco: Never   Tobacco comments:    1 pack every 2 weeks  Vaping Use   Vaping Use: Never used  Substance Use Topics   Alcohol use: Never   Drug use: Never    Review of Systems  Constitutional:   No fever or chills.  ENT:   No sore throat. No rhinorrhea. Cardiovascular:   No chest pain or syncope. Respiratory:   No dyspnea or cough. Gastrointestinal:   Negative for abdominal pain, vomiting and diarrhea.  Musculoskeletal:   Positive  musculoskeletal pains as above.  Increased peripheral edema All other systems reviewed and are negative except as documented above in ROS and HPI.  ____________________________________________   PHYSICAL EXAM:  VITAL SIGNS: ED Triage Vitals  Enc Vitals Group     BP 08/31/20 0907 140/89     Pulse Rate 08/31/20 0907 (!) 132     Resp 08/31/20 0907 (!) 22     Temp 08/31/20 0907 97.9 F (36.6 C)     Temp Source 08/31/20 0907 Oral     SpO2 08/31/20 0903 96 %     Weight 08/31/20 0906 128 lb 14.4 oz (58.5 kg)     Height 08/31/20 0906 5\' 4"  (1.626 m)     Head Circumference --      Peak Flow --      Pain Score 08/31/20 0905 5     Pain Loc --      Pain Edu? --      Excl. in GC? --     Vital signs reviewed, nursing assessments reviewed.   Constitutional:   Alert and oriented. Non-toxic appearance. Eyes:   Conjunctivae are normal. EOMI. PERRL. ENT      Head:   Normocephalic and atraumatic.      Nose:   Normal      Mouth/Throat:   Normal slightly dry mucous membranes      Neck:   No meningismus. Full ROM. Hematological/Lymphatic/Immunilogical:   No cervical lymphadenopathy. Cardiovascular:   Irregularly irregular rhythm, heart rate 110-120. Symmetric bilateral radial and DP pulses.  No murmurs. Cap refill less than 2 seconds. Respiratory:   Normal respiratory effort without tachypnea/retractions. Breath sounds are clear and equal bilaterally. No wheezes/rales/rhonchi. Gastrointestinal:   Soft and nontender. Non distended. There is no CVA tenderness.  No rebound, rigidity, or guarding. Musculoskeletal:   Normal passive range of motion in all extremities. No joint effusions.  There is tenderness at the right hip as well as right knee at the distal femur and left knee at the distal femur.  1+ pitting edema bilateral lower extremities with symmetric calf circumference, no calf tenderness, negative Homans' sign.  No midline spinal tenderness. Neurologic:   Normal speech and language.  Motor  grossly intact. No acute focal neurologic deficits are appreciated.  Skin:    Skin is warm, dry and intact. No rash noted.  No petechiae, purpura, or bullae.  ____________________________________________    LABS (pertinent positives/negatives) (all labs ordered are listed, but only abnormal results are displayed) Labs Reviewed  COMPREHENSIVE METABOLIC PANEL - Abnormal; Notable for the following components:      Result Value   Sodium 130 (*)    Chloride 94 (*)    Glucose, Bld 178 (*)    Calcium 8.8 (*)    All other components within normal limits  CBC WITH DIFFERENTIAL/PLATELET - Abnormal; Notable for the following components:  WBC 10.6 (*)    Neutro Abs 8.8 (*)    Lymphs Abs 0.6 (*)    Monocytes Absolute 1.1 (*)    All other components within normal limits  URINALYSIS, COMPLETE (UACMP) WITH MICROSCOPIC - Abnormal; Notable for the following components:   Color, Urine YELLOW (*)    APPearance CLOUDY (*)    Bacteria, UA RARE (*)    All other components within normal limits   ____________________________________________   EKG  Interpreted by me Atrial fibrillation, rate of 135.  Normal axis.  Normal intervals.  Normal QRS ST segments and T waves.  ____________________________________________    RADIOLOGY  DG Chest 1 View  Result Date: 08/31/2020 CLINICAL DATA:  Unwitnessed fall. EXAM: CHEST  1 VIEW COMPARISON:  Chest radiograph 05/05/2020; CT chest 05/05/2020 FINDINGS: Stable mild enlargement of the cardiac silhouette. Aortic calcifications. Both lungs are clear. Diffuse osteopenia. No acutely displaced fracture. IMPRESSION: No active cardiopulmonary disease.  No acutely displaced fracture. Aortic Atherosclerosis (ICD10-I70.0). Electronically Signed   By: Sherron AlesLaura  Parra MD   On: 08/31/2020 10:09   CT HEAD WO CONTRAST (5MM)  Result Date: 08/31/2020 CLINICAL DATA:  Found down, unwitnessed fall, difficulty walking EXAM: CT HEAD WITHOUT CONTRAST CT CERVICAL SPINE WITHOUT CONTRAST  TECHNIQUE: Multidetector CT imaging of the head and cervical spine was performed following the standard protocol without intravenous contrast. Multiplanar CT image reconstructions of the cervical spine were also generated. COMPARISON:  04/01/2018 FINDINGS: CT HEAD FINDINGS Brain: Diffuse brain atrophy pattern with chronic white matter microvascular changes throughout both cerebral hemispheres. No acute intracranial hemorrhage, new mass lesion, midline shift, herniation, hydrocephalus, or extra-axial fluid collection. No focal mass effect or edema. Cisterns are patent. Cerebellar atrophy as well. Vascular: Intracranial atherosclerosis at the skull base. No hyperdense vessel. Skull: Normal. Negative for fracture or focal lesion. Sinuses/Orbits: Scattered coastal thickening throughout the ethmoid, sphenoid and maxillary sinuses. Small right maxillary sinus air-fluid level may represent acute sinus disease. Other: None. CT CERVICAL SPINE FINDINGS Alignment: Normal. Skull base and vertebrae: No acute fracture. No primary bone lesion or focal pathologic process. Soft tissues and spinal canal: No prevertebral fluid or swelling. No visible canal hematoma. Disc levels: Multilevel cervical degenerative disc disease as well as degenerative changes of the C1-2 articulation. Disc space narrowing, endplate sclerosis and bony endplate osteophytes most pronounced at C5 through T2. Facets are aligned. Multilevel facet arthropathy. No subluxation or dislocation. Upper chest: Biapical pleural-parenchymal scarring with calcification. Other: None. IMPRESSION: No acute intracranial abnormality. Stable atrophy and chronic white matter microvascular ischemic changes. Cervical spine degenerative changes as above. No acute osseous finding, definite fracture or malalignment by CT. Electronically Signed   By: Judie PetitM.  Shick M.D.   On: 08/31/2020 10:47   CT Cervical Spine Wo Contrast  Result Date: 08/31/2020 CLINICAL DATA:  Found down,  unwitnessed fall, difficulty walking EXAM: CT HEAD WITHOUT CONTRAST CT CERVICAL SPINE WITHOUT CONTRAST TECHNIQUE: Multidetector CT imaging of the head and cervical spine was performed following the standard protocol without intravenous contrast. Multiplanar CT image reconstructions of the cervical spine were also generated. COMPARISON:  04/01/2018 FINDINGS: CT HEAD FINDINGS Brain: Diffuse brain atrophy pattern with chronic white matter microvascular changes throughout both cerebral hemispheres. No acute intracranial hemorrhage, new mass lesion, midline shift, herniation, hydrocephalus, or extra-axial fluid collection. No focal mass effect or edema. Cisterns are patent. Cerebellar atrophy as well. Vascular: Intracranial atherosclerosis at the skull base. No hyperdense vessel. Skull: Normal. Negative for fracture or focal lesion. Sinuses/Orbits: Scattered coastal thickening throughout the  ethmoid, sphenoid and maxillary sinuses. Small right maxillary sinus air-fluid level may represent acute sinus disease. Other: None. CT CERVICAL SPINE FINDINGS Alignment: Normal. Skull base and vertebrae: No acute fracture. No primary bone lesion or focal pathologic process. Soft tissues and spinal canal: No prevertebral fluid or swelling. No visible canal hematoma. Disc levels: Multilevel cervical degenerative disc disease as well as degenerative changes of the C1-2 articulation. Disc space narrowing, endplate sclerosis and bony endplate osteophytes most pronounced at C5 through T2. Facets are aligned. Multilevel facet arthropathy. No subluxation or dislocation. Upper chest: Biapical pleural-parenchymal scarring with calcification. Other: None. IMPRESSION: No acute intracranial abnormality. Stable atrophy and chronic white matter microvascular ischemic changes. Cervical spine degenerative changes as above. No acute osseous finding, definite fracture or malalignment by CT. Electronically Signed   By: Judie Petit.  Shick M.D.   On: 08/31/2020  10:47   DG Knee Complete 4 Views Left  Result Date: 08/31/2020 CLINICAL DATA:  Larey Seat with knee region pain. EXAM: LEFT KNEE - COMPLETE 4+ VIEW COMPARISON:  None. FINDINGS: No visible joint effusion. No significant osteo arthritic change. No evidence of fracture or other focal bone finding. Incidental arterial calcification in the region. IMPRESSION: No acute traumatic finding. Electronically Signed   By: Paulina Fusi M.D.   On: 08/31/2020 10:07   DG Knee Complete 4 Views Right  Result Date: 08/31/2020 CLINICAL DATA:  Right knee pain after unwitnessed fall. EXAM: RIGHT KNEE - COMPLETE 4+ VIEW COMPARISON:  None. FINDINGS: No evidence of fracture, dislocation, or joint effusion. Moderate narrowing of medial joint space is noted. Soft tissues are unremarkable. IMPRESSION: Moderate degenerative joint disease is noted medially. No acute abnormality seen. Electronically Signed   By: Lupita Raider M.D.   On: 08/31/2020 09:56   DG Hip Unilat W or Wo Pelvis 2-3 Views Right  Result Date: 08/31/2020 CLINICAL DATA:  Acute right hip pain after unwitnessed fall. EXAM: DG HIP (WITH OR WITHOUT PELVIS) 2-3V RIGHT COMPARISON:  None. FINDINGS: There is no evidence of hip fracture or dislocation. There is no evidence of arthropathy or other focal bone abnormality. IMPRESSION: Negative. Electronically Signed   By: Lupita Raider M.D.   On: 08/31/2020 09:55    ____________________________________________   PROCEDURES Procedures  ____________________________________________  DIFFERENTIAL DIAGNOSIS   Dehydration, electrolyte abnormality, medication side effect, hip fracture, knee fracture, intracranial hemorrhage, C-spine fracture  CLINICAL IMPRESSION / ASSESSMENT AND PLAN / ED COURSE  Medications ordered in the ED: Medications - No data to display  Pertinent labs & imaging results that were available during my care of the patient were reviewed by me and considered in my medical decision making (see chart for  details).  Tracy Singh was evaluated in Emergency Department on 08/31/2020 for the symptoms described in the history of present illness. She was evaluated in the context of the global COVID-19 pandemic, which necessitated consideration that the patient might be at risk for infection with the SARS-CoV-2 virus that causes COVID-19. Institutional protocols and algorithms that pertain to the evaluation of patients at risk for COVID-19 are in a state of rapid change based on information released by regulatory bodies including the CDC and federal and state organizations. These policies and algorithms were followed during the patient's care in the ED.     Clinical Course as of 08/31/20 1134  Mon Aug 31, 2020  1610 Patient presents with 2 falls at home overnight, complaining of right hip pain and left knee pain.  Will obtain CT head and  neck to evaluate for spinal injury or intracranial hemorrhage.  x-rays to evaluate for musculoskeletal injuries.  Will check labs as well.  Gait instability and falls may be due to recently using Tylenol with codeine causing excessive drowsiness at night along with increased wake ups due to restarting furosemide lately.  Leg edema may also be causing trouble walking, and there may be a contribution of fatigue due to recently starting Tymlos for osteoporosis 3 days ago [PS]  0944 Right hip and pelvis x-ray viewed and interpreted by me, unremarkable no acute fracture.  First knee x-ray done was actually performed on right knee which appears unremarkable to me.  Will order left knee as well.  Chest x-ray unremarkable. [PS]  1026 Radiology report confirms negative x-rays. [PS]    Clinical Course User Index [PS] Sharman Cheek, MD    ----------------------------------------- 11:34 AM on 08/31/2020 ----------------------------------------- Work-up entirely reassuring.  Labs unremarkable.  Sodium on the low end of normal but not significantly decreased from her  baseline.  Daughter will discontinue codeine/opioid containing medications, use Tylenol only for pain.  Will give Lasix only in the morning so that patient is not waking up frequently in the night to urinate.  She will talk to her physical therapy company about home health aide, talk to her PCP about arranging that as well.  No other acute needs at this time, stable for discharge   ____________________________________________   FINAL CLINICAL IMPRESSION(S) / ED DIAGNOSES    Final diagnoses:  Fall in home, initial encounter  Chronic atrial fibrillation [facility])  Peripheral edema     ED Discharge Orders     None       Portions of this note were generated with dragon dictation software. Dictation errors may occur despite best attempts at proofreading.    Sharman Cheek, MD 08/31/20 1135

## 2021-01-07 ENCOUNTER — Emergency Department: Payer: Medicare HMO

## 2021-01-07 ENCOUNTER — Other Ambulatory Visit
Admission: RE | Admit: 2021-01-07 | Discharge: 2021-01-07 | Disposition: A | Discharge status: Home / Self Care | Payer: Medicare HMO | Source: Ambulatory Visit | Attending: *Deleted | Admitting: *Deleted

## 2021-01-07 ENCOUNTER — Emergency Department
Admission: EM | Admit: 2021-01-07 | Discharge: 2021-01-07 | Disposition: A | Discharge status: Home / Self Care | Payer: Medicare HMO | Attending: Student in an Organized Health Care Education/Training Program | Admitting: Student in an Organized Health Care Education/Training Program

## 2021-01-07 ENCOUNTER — Other Ambulatory Visit: Payer: Self-pay

## 2021-01-07 ENCOUNTER — Encounter: Payer: Self-pay | Admitting: Intensive Care

## 2021-01-07 DIAGNOSIS — I4891 Unspecified atrial fibrillation: Secondary | ICD-10-CM | POA: Insufficient documentation

## 2021-01-07 DIAGNOSIS — R0602 Shortness of breath: Secondary | ICD-10-CM | POA: Diagnosis not present

## 2021-01-07 DIAGNOSIS — Z79899 Other long term (current) drug therapy: Secondary | ICD-10-CM | POA: Diagnosis not present

## 2021-01-07 DIAGNOSIS — R059 Cough, unspecified: Secondary | ICD-10-CM | POA: Diagnosis not present

## 2021-01-07 DIAGNOSIS — R051 Acute cough: Secondary | ICD-10-CM | POA: Insufficient documentation

## 2021-01-07 DIAGNOSIS — I509 Heart failure, unspecified: Secondary | ICD-10-CM | POA: Insufficient documentation

## 2021-01-07 DIAGNOSIS — M7989 Other specified soft tissue disorders: Secondary | ICD-10-CM | POA: Insufficient documentation

## 2021-01-07 DIAGNOSIS — Z7901 Long term (current) use of anticoagulants: Secondary | ICD-10-CM | POA: Insufficient documentation

## 2021-01-07 DIAGNOSIS — R062 Wheezing: Secondary | ICD-10-CM | POA: Insufficient documentation

## 2021-01-07 DIAGNOSIS — I10 Essential (primary) hypertension: Secondary | ICD-10-CM | POA: Diagnosis not present

## 2021-01-07 DIAGNOSIS — Z20822 Contact with and (suspected) exposure to covid-19: Secondary | ICD-10-CM | POA: Insufficient documentation

## 2021-01-07 DIAGNOSIS — Z87891 Personal history of nicotine dependence: Secondary | ICD-10-CM | POA: Insufficient documentation

## 2021-01-07 LAB — RESP PANEL BY RT-PCR (FLU A&B, COVID) ARPGX2
Influenza A by PCR: NEGATIVE
Influenza B by PCR: NEGATIVE
SARS Coronavirus 2 by RT PCR: NEGATIVE

## 2021-01-07 LAB — CBC
HCT: 40.4 % (ref 36.0–46.0)
Hemoglobin: 13.6 g/dL (ref 12.0–15.0)
MCH: 32.4 pg (ref 26.0–34.0)
MCHC: 33.7 g/dL (ref 30.0–36.0)
MCV: 96.2 fL (ref 80.0–100.0)
Platelets: 251 10*3/uL (ref 150–400)
RBC: 4.2 MIL/uL (ref 3.87–5.11)
RDW: 13.8 % (ref 11.5–15.5)
WBC: 6.4 10*3/uL (ref 4.0–10.5)
nRBC: 0 % (ref 0.0–0.2)

## 2021-01-07 LAB — PROTIME-INR
INR: 2 — ABNORMAL HIGH (ref 0.8–1.2)
Prothrombin Time: 22.5 seconds — ABNORMAL HIGH (ref 11.4–15.2)

## 2021-01-07 LAB — BRAIN NATRIURETIC PEPTIDE
B Natriuretic Peptide: 750.2 pg/mL — ABNORMAL HIGH (ref 0.0–100.0)
B Natriuretic Peptide: 675.3 pg/mL — ABNORMAL HIGH (ref 0.0–100.0)

## 2021-01-07 LAB — BASIC METABOLIC PANEL
Anion gap: 7 (ref 5–15)
BUN: 20 mg/dL (ref 8–23)
CO2: 27 mmol/L (ref 22–32)
Calcium: 10.5 mg/dL — ABNORMAL HIGH (ref 8.9–10.3)
Chloride: 99 mmol/L (ref 98–111)
Creatinine, Ser: 0.62 mg/dL (ref 0.44–1.00)
GFR, Estimated: >60 mL/min (ref >60)
Glucose, Bld: 95 mg/dL (ref 70–99)
Potassium: 3.8 mmol/L (ref 3.5–5.1)
Sodium: 133 mmol/L — ABNORMAL LOW (ref 135–145)

## 2021-01-07 LAB — TROPONIN I (HIGH SENSITIVITY)
Troponin I (High Sensitivity): 13 ng/L (ref <18)
Troponin I (High Sensitivity): 14 ng/L (ref <18)

## 2021-01-07 MED ORDER — IOHEXOL 300 MG/ML  SOLN
75.0000 mL | Freq: Once | INTRAMUSCULAR | Status: AC | PRN
  Administered 2021-01-07: 75 mL via INTRAVENOUS

## 2021-01-07 MED ORDER — IPRATROPIUM-ALBUTEROL 0.5-2.5 (3) MG/3ML IN SOLN
3.0000 mL | Freq: Once | RESPIRATORY_TRACT | Status: AC
  Administered 2021-01-07: 3 mL via RESPIRATORY_TRACT
  Filled 2021-01-07: qty 3

## 2021-01-07 NOTE — ED Triage Notes (Signed)
Was seen at doctor Tuesday for cough. Tuesday night started wheezing. Daughter brought patient to [facility] because she is still wheezing. [facility] sent patient here for "concerns of pleural effusion and heart appears enlarged."

## 2021-01-07 NOTE — Discharge Instructions (Signed)
Please provide her her Lasix every day.

## 2021-01-07 NOTE — ED Provider Notes (Signed)
[facility] Emergency Department Provider Note    Event Date/Time   First MD Initiated Contact with Patient 01/07/21 1427     (approximate)  I have reviewed the triage vital signs and the nursing notes.   HISTORY  Chief Complaint Cough and Shortness of Breath    HPI Tracy Singh is a 87 y.o. female with a history of A. fib on anticoagulation presents the ER for evaluation of several days of cough and wheezing.  Patient was given nebulizer treatment by her daughter at home with not much significant improvement.  She denies any chest pain.  States that she feels short of breath from time to time.  Has remote history of smoking but adamantly denies any history of COPD.  Patient went to urgent care today for evaluation and had chest x-ray performed which showed cardiomegaly and was sent to the ER for evaluation.  She does have a reported history of CHF though she has recent cardiac echo and March of this year with normal EF.  Denies any nausea or vomiting.  May felt a little bit of improvement after nebulizer treatment.   Past Medical History:  Diagnosis Date   A-fib (HCC)    Sepsis (HCC) 03/21/2018   Von Willebrand disease    Family History  Problem Relation Age of Onset   Bladder Cancer Father    Cancer Brother    Past Surgical History:  Procedure Laterality Date   ABDOMINAL HYSTERECTOMY     Patient Active Problem List   Diagnosis Date Noted   Atrial fibrillation with rapid ventricular response (HCC) 05/05/2020   Diarrhea, functional 05/15/2018   Atrial fibrillation, chronic (HCC) 03/10/2015   Essential hypertension 10/06/2013   Von Willebrand disease 10/06/2013      Prior to Admission medications   Medication Sig Start Date End Date Taking? Authorizing Provider  Abaloparatide (TYMLOS) 3120 MCG/1.56ML SOPN Inject into the skin.   Yes [provider]  acetaminophen (TYLENOL) 325 MG tablet Take 1-2 tablets by mouth daily as  needed.   Yes [provider]  benzonatate (TESSALON) 100 MG capsule Take 1 capsule by mouth every 6 (six) hours as needed. 01/05/21 01/05/22 Yes [provider]  cefdinir (OMNICEF) 300 MG capsule Take 1 capsule by mouth in the morning and at bedtime. 01/05/21 01/15/21 Yes [provider]  diltiazem (CARDIZEM CD) 120 MG 24 hr capsule Take 1 capsule (120 mg total) by mouth daily. 05/15/20  Yes Sreenath, Sudheer B, MD  loperamide (IMODIUM) 2 MG capsule Take by mouth as needed for diarrhea or loose stools.   Yes [provider]  metoprolol succinate (TOPROL-XL) 50 MG 24 hr tablet Take 50 mg by mouth daily. 03/24/16  Yes [provider]  Multiple Vitamin (MULTIVITAMIN) capsule Take 1 capsule by mouth daily.   Yes [provider]  PRADAXA 150 MG CAPS capsule Take 150 mg by mouth. bid 02/23/18  Yes [provider]  albuterol (VENTOLIN HFA) 108 (90 Base) MCG/ACT inhaler Inhale 2 puffs into the lungs every 6 (six) hours as needed. Patient not taking: Reported on 01/07/2021 01/07/21   [provider]  furosemide (LASIX) 20 MG tablet Take 1 tablet (20 mg total) by mouth daily. 05/16/20   Sreenath, Sudheer B, MD  lidocaine (LIDODERM) 5 % Place 1 patch onto the skin daily. Remove & Discard patch within 12 hours or as directed by MD Patient not taking: Reported on 01/07/2021 05/15/20   Tresa Moore, MD  Allergies Red dye and Yellow dyes (non-tartrazine)    Social History Social History   Tobacco Use   Smoking status: Former    Packs/day: 0.05    Years: 6.00    Pack years: 0.30    Types: Cigarettes    Quit date: 98    Years since quitting: 65.9   Smokeless tobacco: Never   Tobacco comments:    1 pack every 2 weeks  Vaping Use   Vaping Use: Never used  Substance Use Topics   Alcohol use: Never   Drug use: Never    Review of Systems Patient denies headaches, rhinorrhea, blurry vision, numbness, shortness of breath,  chest pain, edema, cough, abdominal pain, nausea, vomiting, diarrhea, dysuria, fevers, rashes or hallucinations unless otherwise stated above in HPI. ____________________________________________   PHYSICAL EXAM:  VITAL SIGNS: Vitals:   01/07/21 1257  BP: 129/74  Pulse: 98  Resp: (!) 22  Temp: 98.5 F (36.9 C)  SpO2: 93%    Constitutional: Alert and oriented.  Eyes: Conjunctivae are normal.  Head: Atraumatic. Nose: No congestion/rhinnorhea. Mouth/Throat: Mucous membranes are moist.   Neck: No stridor. Painless ROM.  Cardiovascular: Normal rate, regular rhythm. Grossly normal heart sounds.  Good peripheral circulation. Respiratory: Normal respiratory effort.  No retractions. Lungs with scattered wheeze, no crackles or rhonchi Gastrointestinal: Soft and nontender. No distention. No abdominal bruits. No CVA tenderness. Genitourinary:  Musculoskeletal: No lower extremity tenderness, trace bilateral edema.  No joint effusions. Neurologic:  Normal speech and language. No gross focal neurologic deficits are appreciated. No facial droop Skin:  Skin is warm, dry and intact. No rash noted. Psychiatric: Mood and affect are normal. Speech and behavior are normal.  ____________________________________________   LABS (all labs ordered are listed, but only abnormal results are displayed)  Results for orders placed or performed during the hospital encounter of 01/07/21 (from the past 24 hour(s))  Basic metabolic panel     Status: Abnormal   Collection Time: 01/07/21 12:56 PM  Result Value Ref Range   Sodium 133 (L) 135 - 145 mmol/L   Potassium 3.8 3.5 - 5.1 mmol/L   Chloride 99 98 - 111 mmol/L   CO2 27 22 - 32 mmol/L   Glucose, Bld 95 70 - 99 mg/dL   BUN 20 8 - 23 mg/dL   Creatinine, Ser 1.00 0.44 - 1.00 mg/dL   Calcium 71.2 (H) 8.9 - 10.3 mg/dL   GFR, Estimated >19 >75 mL/min   Anion gap 7 5 - 15  CBC     Status: None   Collection Time: 01/07/21 12:56 PM  Result Value Ref Range    WBC 6.4 4.0 - 10.5 K/uL   RBC 4.20 3.87 - 5.11 MIL/uL   Hemoglobin 13.6 12.0 - 15.0 g/dL   HCT 88.3 25.4 - 98.2 %   MCV 96.2 80.0 - 100.0 fL   MCH 32.4 26.0 - 34.0 pg   MCHC 33.7 30.0 - 36.0 g/dL   RDW 64.1 58.3 - 09.4 %   Platelets 251 150 - 400 K/uL   nRBC 0.0 0.0 - 0.2 %  Troponin I (High Sensitivity)     Status: None   Collection Time: 01/07/21 12:56 PM  Result Value Ref Range   Troponin I (High Sensitivity) 13 <18 ng/L  Protime-INR (order if Patient is taking Coumadin / Warfarin)     Status: Abnormal   Collection Time: 01/07/21 12:56 PM  Result Value Ref Range   Prothrombin Time 22.5 (H) 11.4 - 15.2 seconds  INR 2.0 (H) 0.8 - 1.2  Brain natriuretic peptide     Status: Abnormal   Collection Time: 01/07/21  1:04 PM  Result Value Ref Range   B Natriuretic Peptide 675.3 (H) 0.0 - 100.0 pg/mL   ____________________________________________  EKG My review and personal interpretation at Time: 12:59   Indication: wheezing  Rate: 105  Rhythm: afib Axis: normal Other: no stemi, non specific st abn ____________________________________________  RADIOLOGY  I personally reviewed all radiographic images ordered to evaluate for the above acute complaints and reviewed radiology reports and findings.  These findings were personally discussed with the patient.  Please see medical record for radiology report.  ____________________________________________   PROCEDURES  Procedure(s) performed:  Procedures    Critical Care performed: no ____________________________________________   INITIAL IMPRESSION / ASSESSMENT AND PLAN / ED COURSE  Pertinent labs & imaging results that were available during my care of the patient were reviewed by me and considered in my medical decision making (see chart for details).   DDX: Asthma, copd, CHF, pna, ptx, malignancy, Pe, anemia   MAHEK SCHLESINGER is a 87 y.o. who presents to the ED with wheezing as described above.  Patient adamantly  denies being diagnosed with COPD but does have what appear to be hyperexpanded lungs on chest x-ray as well as wheezing throughout has a history of smoking.  Will trial nebulizer and reassess.  Given her cardiomegaly on chest x-ray but with normal echo March will order CT imaging to evaluate for pleural effusion but will also further characterize possible effusions and/or if there is signs of pneumonia or pulmonary edema.  Blood work is otherwise reassuring.  She does not appear to be in any respiratory distress.  Have added on BNP.  Have a lower suspicion for ACS.  Patient states that she does not want to be admitted if at all possible.  Okey Regal be signed out to oncoming physician pending follow-up CT imaging and reassessment.     The patient was evaluated in Emergency Department today for the symptoms described in the history of present illness. He/she was evaluated in the context of the global COVID-19 pandemic, which necessitated consideration that the patient might be at risk for infection with the SARS-CoV-2 virus that causes COVID-19. Institutional protocols and algorithms that pertain to the evaluation of patients at risk for COVID-19 are in a state of rapid change based on information released by regulatory bodies including the CDC and federal and state organizations. These policies and algorithms were followed during the patient's care in the ED.  As part of my medical decision making, I reviewed the following data within the electronic MEDICAL RECORD NUMBER Nursing notes reviewed and incorporated, Labs reviewed, notes from prior ED visits and [place] Controlled Substance Database   ____________________________________________   FINAL CLINICAL IMPRESSION(S) / ED DIAGNOSES  Final diagnoses:  Acute cough      NEW MEDICATIONS STARTED DURING THIS VISIT:  New Prescriptions   No medications on file     Note:  This document was prepared using Dragon voice recognition software and may include  unintentional dictation errors.    Willy Eddy, MD 01/07/21 [phone]

## 2021-01-07 NOTE — ED Provider Notes (Signed)
Patient received in signout from Dr. Roxan Hockey pending CT chest with contrast to assess for pneumonia due to family concerns for this.  CT with a small pleural effusion on the right, but no signs of infiltration to suggest pneumonia.  Overall work-up is suggestive of CHF exacerbation and wheezing.  Uncertain etiology of her wheezing, whether it be chronic bronchitis, COPD or asthma, but daughter at the bedside is quite agitated and upset that she has been labeled as having had COPD.  We discussed reinitiation of patient's Lasix at home and she probably needs to take it every day until she can be reassessed as an outpatient.  We discussed the possibility of medical observation admission, but they adamantly refused this indicating that they are "definitely going home.  "We discussed return precautions for the ED.   Delton Prairie, MD 01/07/21 [phone]

## 2021-01-07 NOTE — ED Notes (Signed)
Pt returned from CT °

## 2021-06-25 ENCOUNTER — Ambulatory Visit
Admission: EM | Admit: 2021-06-25 | Discharge: 2021-06-25 | Disposition: A | Discharge status: Home / Self Care | Payer: Medicare HMO | Attending: Emergency Medicine | Admitting: Emergency Medicine

## 2021-06-25 DIAGNOSIS — L739 Follicular disorder, unspecified: Secondary | ICD-10-CM

## 2021-06-25 MED ORDER — CIPROFLOXACIN HCL 500 MG PO TABS: 500.0000 mg | ORAL_TABLET | Freq: Two times a day (BID) | ORAL | 0 refills | Status: DC

## 2021-06-25 MED ORDER — AMOXICILLIN 500 MG PO CAPS: 500.0000 mg | ORAL_CAPSULE | Freq: Three times a day (TID) | ORAL | 0 refills | Status: DC

## 2021-06-25 NOTE — Discharge Instructions (Addendum)
Take the Amoxicillin three times daily with food for 7 days.  Apply OTC Cortizone 10 to the rash twice daily to help with itching.  Keep the areas clean, dry, and open to air as much as possible.  If any new symptoms develop new or worsening symptoms return for re-evaluation of see your PCP.

## 2021-06-25 NOTE — ED Provider Notes (Signed)
[facility]    CSN: [id number] Arrival date & time: 06/25/21  7672      History   Chief Complaint Chief Complaint  Patient presents with   Rash         HPI Tracy Singh is a 88 y.o. female.   HPI  88 year old female here for evaluation of skin complaint.  Patient is here with her daughter for evaluation of a skin rash that has been present for the last 7 days.  The patient's daughter reports that it began on the patient's dcolletage and now has spread down to beneath her breasts, up on the front part of both shoulders, and between her legs.  She has not had any new medication, personal hygiene products, or clothing.  She did recently try to ensure and the daughter is unsure if that caused it.  She was also recently started to sleep with a teddy bear that she snuggles to her chest, where the rash began, and she wears an undershirt and a longsleeve shirt to bed.  The daughter is here requesting assistance with resolution of the rash and help with the itching.  She has been taking Zyrtec and applying calamine lotion without significant improvement of symptoms.  Patient has not had a fever.  Past Medical History:  Diagnosis Date   A-fib (HCC)    Sepsis (HCC) 03/21/2018   Von Willebrand disease [facility])     Patient Active Problem List   Diagnosis Date Noted   Atrial fibrillation with rapid ventricular response (HCC) 05/05/2020   Diarrhea, functional 05/15/2018   Atrial fibrillation, chronic (HCC) 03/10/2015   Essential hypertension 10/06/2013   Von Willebrand disease (HCC) 10/06/2013    Past Surgical History:  Procedure Laterality Date   ABDOMINAL HYSTERECTOMY      OB History   No obstetric history on file.      Home Medications    Prior to Admission medications   Medication Sig Start Date End Date Taking? Authorizing Provider  amoxicillin (AMOXIL) 500 MG capsule Take 1 capsule (500 mg total) by mouth 3 (three) times daily. 06/25/21  Yes Becky Augusta, NP  Abaloparatide (TYMLOS) 3120 MCG/1.56ML SOPN Inject into the skin.    [provider]  acetaminophen (TYLENOL) 325 MG tablet Take 1-2 tablets by mouth daily as needed.    [provider]  albuterol (VENTOLIN HFA) 108 (90 Base) MCG/ACT inhaler Inhale 2 puffs into the lungs every 6 (six) hours as needed. Patient not taking: Reported on 01/07/2021 01/07/21   [provider]  benzonatate (TESSALON) 100 MG capsule Take 1 capsule by mouth every 6 (six) hours as needed. 01/05/21 01/05/22  [provider]  diltiazem (CARDIZEM CD) 120 MG 24 hr capsule Take 1 capsule (120 mg total) by mouth daily. 05/15/20   Tresa Moore, MD  furosemide (LASIX) 20 MG tablet Take 1 tablet (20 mg total) by mouth daily. 05/16/20   Sreenath, Sudheer B, MD  lidocaine (LIDODERM) 5 % Place 1 patch onto the skin daily. Remove & Discard patch within 12 hours or as directed by MD Patient not taking: Reported on 01/07/2021 05/15/20   Tresa Moore, MD  loperamide (IMODIUM) 2 MG capsule Take by mouth as needed for diarrhea or loose stools.    [provider]  metoprolol succinate (TOPROL-XL) 50 MG 24 hr tablet Take 50 mg by mouth daily. 03/24/16   [provider]  Multiple Vitamin (MULTIVITAMIN) capsule Take 1 capsule by mouth daily.  [provider]  PRADAXA 150 MG CAPS capsule Take 150 mg by mouth. bid 02/23/18   [provider]    Family History Family History  Problem Relation Age of Onset   Bladder Cancer Father    Cancer Brother     Social History Social History   Tobacco Use   Smoking status: Former    Packs/day: 0.05    Years: 6.00    Pack years: 0.30    Types: Cigarettes    Quit date: 15    Years since quitting: 66.4   Smokeless tobacco: Never   Tobacco comments:    1 pack every 2 weeks  Vaping Use   Vaping Use: Never used  Substance Use Topics   Alcohol use: Never   Drug use: Never     Allergies   Red dye  and Yellow dyes (non-tartrazine)   Review of Systems Review of Systems  Constitutional:  Negative for fever.  Skin:  Positive for rash. Negative for color change.  Hematological: Negative.   Psychiatric/Behavioral: Negative.      Physical Exam Triage Vital Signs ED Triage Vitals  Enc Vitals Group     BP 06/25/21 1043 (!) 158/84     Pulse Rate 06/25/21 1043 88     Resp 06/25/21 1043 18     Temp 06/25/21 1043 97.7 F (36.5 C)     Temp Source 06/25/21 1043 Oral     SpO2 06/25/21 1043 99 %     Weight --      Height --      Head Circumference --      Peak Flow --      Pain Score 06/25/21 1042 0     Pain Loc --      Pain Edu? --      Excl. in GC? --    No data found.  Updated Vital Signs BP (!) 158/84 (BP Location: Left Arm)   Pulse 88   Temp 97.7 F (36.5 C) (Oral)   Resp 18   SpO2 99%   Visual Acuity Right Eye Distance:   Left Eye Distance:   Bilateral Distance:    Right Eye Near:   Left Eye Near:    Bilateral Near:     Physical Exam Vitals and nursing note reviewed.  Constitutional:      Appearance: Normal appearance. She is not ill-appearing.  Skin:    General: Skin is warm and dry.     Capillary Refill: Capillary refill takes less than 2 seconds.     Findings: Rash present. No erythema.  Neurological:     General: No focal deficit present.     Mental Status: She is alert and oriented to person, place, and time.  Psychiatric:        Mood and Affect: Mood normal.        Behavior: Behavior normal.        Thought Content: Thought content normal.        Judgment: Judgment normal.     UC Treatments / Results  Labs (all labs ordered are listed, but only abnormal results are displayed) Labs Reviewed - No data to display  EKG   Radiology No results found.  Procedures Procedures (including critical care time)  Medications Ordered in UC Medications - No data to display  Initial Impression / Assessment and Plan / UC Course  I have reviewed the  triage vital signs and the nursing notes.  Pertinent labs & imaging results that were  available during my care of the patient were reviewed by me and considered in my medical decision making (see chart for details).  The patient is a very pleasant, nontoxic-appearing 88 year old female here for evaluation of a red, itchy rash across her dcolletage, between her breasts, and between her legs that has been ongoing for the past 7 days.  On exam patient has erythematous maculopapular bumps.  No vesicles are noted.  These are on both sides of the chest.  When viewed under magnification they do appear to be around hair follicles.  Patient has not been in any hot tubs and she does not take baths.  She does take showers.  The daughter thought it might be the shampoo that she was using so she has stopped that.  She reports that the patient does wash her hair and most of the soap cascades across the front of her body where the rash developed.  There are animals in the house but there is no evidence of flea infestation.  I suspect that the patient has folliculitis, most likely secondary to wearing multiple layers of clothing and snuggling with a new stuffed animal.  I originally was going to treat patient with Cipro, as there is no allergy listed, but the daughter is unsure if she had a reaction in the past.  I have changed my therapy to amoxicillin 500 mg 3 times daily with food for 7 days.  I have also advised the daughter that she can apply topical cortisone 10 to the rash twice daily to help with itching in addition to the Zyrtec and calamine lotion that she is currently using.   Final Clinical Impressions(s) / UC Diagnoses   Final diagnoses:  Folliculitis     Discharge Instructions      Take the Amoxicillin three times daily with food for 7 days.  Apply OTC Cortizone 10 to the rash twice daily to help with itching.  Keep the areas clean, dry, and open to air as much as possible.  If any new symptoms  develop new or worsening symptoms return for re-evaluation of see your PCP.     ED Prescriptions     Medication Sig Dispense Auth. Provider   ciprofloxacin (CIPRO) 500 MG tablet  (Status: Discontinued) Take 1 tablet (500 mg total) by mouth 2 (two) times daily for 7 days. 14 tablet Becky Augustayan, Paisley Grajeda, NP   amoxicillin (AMOXIL) 500 MG capsule Take 1 capsule (500 mg total) by mouth 3 (three) times daily. 21 capsule Becky Augustayan, Tam Savoia, NP      PDMP not reviewed this encounter.   Becky Augustayan, Vernica Wachtel, NP 06/25/21 1145

## 2021-06-25 NOTE — ED Triage Notes (Signed)
Pt present rash on her chest, back and in between in her legs. Symptom started 7 days ago.  Pt states it itching  and has spread

## 2022-03-09 ENCOUNTER — Other Ambulatory Visit: Payer: Self-pay

## 2022-03-09 MED ORDER — COVID-19 MRNA VAC-TRIS(PFIZER) 30 MCG/0.3ML IM SUSY
0.3000 mL | PREFILLED_SYRINGE | Freq: Once | INTRAMUSCULAR | 0 refills | Status: AC
  Filled 2022-03-09: qty 0.3, 1d supply, fill #0

## 2022-05-10 ENCOUNTER — Encounter: Payer: Self-pay | Admitting: Internal Medicine

## 2022-05-10 ENCOUNTER — Other Ambulatory Visit: Payer: Self-pay

## 2022-05-10 ENCOUNTER — Emergency Department: Payer: Medicare Other

## 2022-05-10 ENCOUNTER — Inpatient Hospital Stay
Admission: EM | Admit: 2022-05-10 | Discharge: 2022-05-12 | DRG: 149 | Disposition: A | Discharge status: Home Health | Payer: Medicare Other | Attending: Student | Admitting: Student

## 2022-05-10 DIAGNOSIS — E860 Dehydration: Secondary | ICD-10-CM | POA: Diagnosis present

## 2022-05-10 DIAGNOSIS — G9340 Encephalopathy, unspecified: Secondary | ICD-10-CM | POA: Diagnosis present

## 2022-05-10 DIAGNOSIS — H8112 Benign paroxysmal vertigo, left ear: Principal | ICD-10-CM | POA: Diagnosis present

## 2022-05-10 DIAGNOSIS — Z66 Do not resuscitate: Secondary | ICD-10-CM | POA: Diagnosis present

## 2022-05-10 DIAGNOSIS — D6800 Von Willebrand disease, unspecified: Secondary | ICD-10-CM | POA: Diagnosis present

## 2022-05-10 DIAGNOSIS — Z8052 Family history of malignant neoplasm of bladder: Secondary | ICD-10-CM

## 2022-05-10 DIAGNOSIS — I10 Essential (primary) hypertension: Secondary | ICD-10-CM | POA: Diagnosis present

## 2022-05-10 DIAGNOSIS — I4819 Other persistent atrial fibrillation: Secondary | ICD-10-CM | POA: Diagnosis present

## 2022-05-10 DIAGNOSIS — G9341 Metabolic encephalopathy: Secondary | ICD-10-CM | POA: Diagnosis present

## 2022-05-10 DIAGNOSIS — Z7901 Long term (current) use of anticoagulants: Secondary | ICD-10-CM

## 2022-05-10 DIAGNOSIS — H8110 Benign paroxysmal vertigo, unspecified ear: Secondary | ICD-10-CM | POA: Insufficient documentation

## 2022-05-10 DIAGNOSIS — W19XXXA Unspecified fall, initial encounter: Secondary | ICD-10-CM | POA: Diagnosis present

## 2022-05-10 DIAGNOSIS — Z90710 Acquired absence of both cervix and uterus: Secondary | ICD-10-CM

## 2022-05-10 DIAGNOSIS — Z91018 Allergy to other foods: Secondary | ICD-10-CM

## 2022-05-10 DIAGNOSIS — R4182 Altered mental status, unspecified: Secondary | ICD-10-CM

## 2022-05-10 DIAGNOSIS — Z87891 Personal history of nicotine dependence: Secondary | ICD-10-CM

## 2022-05-10 DIAGNOSIS — S5002XA Contusion of left elbow, initial encounter: Secondary | ICD-10-CM | POA: Diagnosis present

## 2022-05-10 DIAGNOSIS — Z79899 Other long term (current) drug therapy: Secondary | ICD-10-CM

## 2022-05-10 DIAGNOSIS — Z7902 Long term (current) use of antithrombotics/antiplatelets: Secondary | ICD-10-CM

## 2022-05-10 DIAGNOSIS — Z9181 History of falling: Secondary | ICD-10-CM

## 2022-05-10 LAB — URINALYSIS, ROUTINE W REFLEX MICROSCOPIC
Bacteria, UA: NONE SEEN
Bilirubin Urine: NEGATIVE
Glucose, UA: NEGATIVE mg/dL
Ketones, ur: NEGATIVE mg/dL
Leukocytes,Ua: NEGATIVE
Nitrite: NEGATIVE
Protein, ur: NEGATIVE mg/dL
Specific Gravity, Urine: 1.012 (ref 1.005–1.030)
WBC, UA: NONE SEEN WBC/hpf (ref 0–5)
pH: 8 (ref 5.0–8.0)

## 2022-05-10 LAB — CBC
HCT: 36.9 % (ref 36.0–46.0)
Hemoglobin: 11.9 g/dL — ABNORMAL LOW (ref 12.0–15.0)
MCH: 31.2 pg (ref 26.0–34.0)
MCHC: 32.2 g/dL (ref 30.0–36.0)
MCV: 96.6 fL (ref 80.0–100.0)
Platelets: 295 10*3/uL (ref 150–400)
RBC: 3.82 MIL/uL — ABNORMAL LOW (ref 3.87–5.11)
RDW: 14.3 % (ref 11.5–15.5)
WBC: 8.4 10*3/uL (ref 4.0–10.5)
nRBC: 0 % (ref 0.0–0.2)

## 2022-05-10 LAB — DIFFERENTIAL
Abs Immature Granulocytes: 0.03 10*3/uL (ref 0.00–0.07)
Basophils Absolute: 0 10*3/uL (ref 0.0–0.1)
Basophils Relative: 0 %
Eosinophils Absolute: 0 10*3/uL (ref 0.0–0.5)
Eosinophils Relative: 0 %
Immature Granulocytes: 0 %
Lymphocytes Relative: 9 %
Lymphs Abs: 0.7 10*3/uL (ref 0.7–4.0)
Monocytes Absolute: 0.7 10*3/uL (ref 0.1–1.0)
Monocytes Relative: 9 %
Neutro Abs: 6.9 10*3/uL (ref 1.7–7.7)
Neutrophils Relative %: 82 %

## 2022-05-10 LAB — COMPREHENSIVE METABOLIC PANEL
ALT: 13 U/L (ref 0–44)
AST: 25 U/L (ref 15–41)
Albumin: 3.8 g/dL (ref 3.5–5.0)
Alkaline Phosphatase: 100 U/L (ref 38–126)
Anion gap: 11 (ref 5–15)
BUN: 27 mg/dL — ABNORMAL HIGH (ref 8–23)
CO2: 25 mmol/L (ref 22–32)
Calcium: 9.5 mg/dL (ref 8.9–10.3)
Chloride: 103 mmol/L (ref 98–111)
Creatinine, Ser: 0.83 mg/dL (ref 0.44–1.00)
GFR, Estimated: >60 mL/min (ref >60)
Glucose, Bld: 125 mg/dL — ABNORMAL HIGH (ref 70–99)
Potassium: 4.2 mmol/L (ref 3.5–5.1)
Sodium: 139 mmol/L (ref 135–145)
Total Bilirubin: 0.6 mg/dL (ref 0.3–1.2)
Total Protein: 7.1 g/dL (ref 6.5–8.1)

## 2022-05-10 LAB — URINE DRUG SCREEN, QUALITATIVE (ARMC ONLY)
Amphetamines, Ur Screen: NOT DETECTED
Barbiturates, Ur Screen: NOT DETECTED
Benzodiazepine, Ur Scrn: NOT DETECTED
Cannabinoid 50 Ng, Ur ~~LOC~~: NOT DETECTED
Cocaine Metabolite,Ur ~~LOC~~: NOT DETECTED
MDMA (Ecstasy)Ur Screen: NOT DETECTED
Methadone Scn, Ur: NOT DETECTED
Opiate, Ur Screen: NOT DETECTED
Phencyclidine (PCP) Ur S: NOT DETECTED
Tricyclic, Ur Screen: NOT DETECTED

## 2022-05-10 MED ORDER — ALBUTEROL SULFATE HFA 108 (90 BASE) MCG/ACT IN AERS: 2.0000 | INHALATION_SPRAY | Freq: Four times a day (QID) | RESPIRATORY_TRACT | Status: DC | PRN

## 2022-05-10 MED ORDER — SODIUM CHLORIDE 0.9 % IV SOLN: INTRAVENOUS | Status: DC

## 2022-05-10 MED ORDER — METOPROLOL SUCCINATE ER 50 MG PO TB24
50.0000 mg | ORAL_TABLET | Freq: Every day | ORAL | Status: DC
  Administered 2022-05-10 – 2022-05-12 (×3): 50 mg via ORAL
  Filled 2022-05-10 (×3): qty 1

## 2022-05-10 MED ORDER — ALBUTEROL SULFATE (2.5 MG/3ML) 0.083% IN NEBU: 2.5000 mg | INHALATION_SOLUTION | Freq: Four times a day (QID) | RESPIRATORY_TRACT | Status: DC | PRN

## 2022-05-10 MED ORDER — DABIGATRAN ETEXILATE MESYLATE 150 MG PO CAPS
150.0000 mg | ORAL_CAPSULE | Freq: Two times a day (BID) | ORAL | Status: DC
  Administered 2022-05-10 – 2022-05-12 (×4): 150 mg via ORAL
  Filled 2022-05-10 (×4): qty 1

## 2022-05-10 MED ORDER — ENOXAPARIN SODIUM 40 MG/0.4ML IJ SOSY: 40.0000 mg | PREFILLED_SYRINGE | INTRAMUSCULAR | Status: DC

## 2022-05-10 MED ORDER — ACETAMINOPHEN 325 MG PO TABS
325.0000 mg | ORAL_TABLET | Freq: Every day | ORAL | Status: DC | PRN
  Administered 2022-05-10 – 2022-05-12 (×2): 650 mg via ORAL
  Filled 2022-05-10 (×2): qty 2

## 2022-05-10 NOTE — ED Notes (Signed)
ED Provider at bedside. 

## 2022-05-10 NOTE — ED Notes (Addendum)
Pt's daughter to bedside. When daughter arrived pt. Called out loudly, "mommy! That's my mommy! She's a good mommy, she takes care of me." Daughter states she has never confused her or called her "mommy". Daughter states pt. Had 2 episodes of very fast heart rate, and pt. Exclaims, "I'm dizzy I'm dizzy, I'm going to fall over!"

## 2022-05-10 NOTE — ED Notes (Signed)
Pt slid up in bed and repositioned by this tech and Phineas Semen, Charity fundraiser. Family remains at bedside. No other needs voiced at this time.

## 2022-05-10 NOTE — ED Notes (Signed)
Pt. And family updated on POC. Pt. Repositioned for comfort, with pillow under knees. Pt. Has no further needs at this time.

## 2022-05-10 NOTE — ED Provider Notes (Signed)
[facility] Provider Note    Event Date/Time   First MD Initiated Contact with Patient 05/10/22 1006     (approximate)   History   Altered Mental Status   HPI {Remember to add pertinent medical, surgical, social, and/or OB history to HPI:1} ISMAEL TREPTOW is a 89 y.o. female  ***       Physical Exam   Triage Vital Signs: ED Triage Vitals  Enc Vitals Group     BP 05/10/22 1009 133/67     Pulse Rate 05/10/22 1009 88     Resp 05/10/22 1009 (!) 23     Temp 05/10/22 1009 97.8 F (36.6 C)     Temp Source 05/10/22 1009 Oral     SpO2 05/10/22 1009 98 %     Weight 05/10/22 1010 124 lb 6.4 oz (56.4 kg)     Height --      Head Circumference --      Peak Flow --      Pain Score 05/10/22 1010 0     Pain Loc --      Pain Edu? --      Excl. in GC? --     Most recent vital signs: Vitals:   05/10/22 1014 05/10/22 1015  BP:    Pulse: 75 81  Resp: 15 17  Temp:    SpO2: 96% 97%    {Only need to document appropriate and relevant physical exam:1} General: Awake, no distress. *** CV:  Good peripheral perfusion. *** Resp:  Normal effort. *** Abd:  No distention. *** Other:  ***   ED Results / Procedures / Treatments   Labs (all labs ordered are listed, but only abnormal results are displayed) Labs Reviewed - No data to display   EKG  ***   RADIOLOGY *** {USE THE WORD "INTERPRETED"!! You MUST document your own interpretation of imaging, as well as the fact that you reviewed the radiologist's report!:1}   PROCEDURES:  Critical Care performed: Yes  CRITICAL CARE Performed by: Phineas Semen   Total critical care time: *** minutes  Critical care time was exclusive of separately billable procedures and treating other patients.  Critical care was necessary to treat or prevent imminent or life-threatening deterioration.  Critical care was time spent personally by me on the following activities: development of treatment plan  with patient and/or surrogate as well as nursing, discussions with consultants, evaluation of patient's response to treatment, examination of patient, obtaining history from patient or surrogate, ordering and performing treatments and interventions, ordering and review of laboratory studies, ordering and review of radiographic studies, pulse oximetry and re-evaluation of patient's condition.   Procedures    MEDICATIONS ORDERED IN ED: Medications - No data to display   IMPRESSION / MDM / ASSESSMENT AND PLAN / ED COURSE  I reviewed the triage vital signs and the nursing notes.                              Differential diagnosis includes, but is not limited to, ***  Patient's presentation is most consistent with {EM COPA:27473}   ***The patient is on the cardiac monitor to evaluate for evidence of arrhythmia and/or significant heart rate changes.  ***      FINAL CLINICAL IMPRESSION(S) / ED DIAGNOSES   Final diagnoses:  None     Rx / DC Orders   ED Discharge Orders     None  Note:  This document was prepared using Dragon voice recognition software and may include unintentional dictation errors.

## 2022-05-10 NOTE — H&P (Signed)
History and Physical    Patient: Tracy Singh ZOX:[id number] DOB: 08-25-1924 DOA: 05/10/2022 DOS: the patient was seen and examined on 05/10/2022 PCP: Jenell Milliner, MD  Patient coming from: Home Home Chief Complaint: Altered mental status Chief Complaint  Patient presents with   Altered Mental Status   HPI: Tracy Singh is a 89 y.o. female with medical history significant of atrial fibrillation on Pradaxa as well as von Willebrand's disease who was otherwise well until this morning when she was found to have generalized dizziness with associated altered mental status.  History obtained from patient's daughter present at bedside as well as patient and previous chart review.  According to the daughter they found patient laying on the floor with a bruise on the left elbow area and they  got her up and placed on her bed however she continued to have dizziness and confusion.  According to the daughter she referred to her as her mother instead of knowing that she is her daughter.  Given the persistent dizziness and confusion EMS was called and patient was brought in for further management.  Mental status was slightly better at the time patient was being seen however it was still not at baseline.  Denied nausea vomiting abdominal pain chest pain or cough.  ED course: In the emergency room CT scan of the brain did not show acute intracranial pathology.  MRI of the brain was done which did not show any acute pathology.  Urinalysis was unrevealing, sodium 139 potassium 4.2 creatinine 0.3.  Given acute altered mental status hospitalist service was therefore contacted to admit patient for further management.   Review of Systems: Detailed review of system unable to obtain on account of altered mental status Past Medical History:  Diagnosis Date   A-fib (HCC)    Sepsis (HCC) 03/21/2018   Von Willebrand disease (HCC)    Past Surgical History:  Procedure Laterality Date   ABDOMINAL  HYSTERECTOMY     Social History:  reports that she quit smoking about 67 years ago. Her smoking use included cigarettes. She has a 0.30 pack-year smoking history. She has never used smokeless tobacco. She reports that she does not drink alcohol and does not use drugs.  Allergies  Allergen Reactions   Red Dye Swelling    Pts daughter states is not allergic to red dye, states has red jello, and other red dye items with no reaction.   Yellow Dyes (Non-Tartrazine) Swelling    Pts daughter states is not allergic to yellow dye and has it at home.    Family History  Problem Relation Age of Onset   Bladder Cancer Father    Cancer Brother     Prior to Admission medications   Medication Sig Start Date End Date Taking? Authorizing Provider  Abaloparatide (TYMLOS) 3120 MCG/1.56ML SOPN Inject into the skin.    [provider]  acetaminophen (TYLENOL) 325 MG tablet Take 1-2 tablets by mouth daily as needed.    [provider]  albuterol (VENTOLIN HFA) 108 (90 Base) MCG/ACT inhaler Inhale 2 puffs into the lungs every 6 (six) hours as needed. Patient not taking: Reported on 01/07/2021 01/07/21   [provider]  amoxicillin (AMOXIL) 500 MG capsule Take 1 capsule (500 mg total) by mouth 3 (three) times daily. 06/25/21   Becky Augusta, NP  diltiazem (CARDIZEM CD) 120 MG 24 hr capsule Take 1 capsule (120 mg total) by mouth daily. 05/15/20   Tresa Moore, MD  furosemide (LASIX) 20  MG tablet Take 1 tablet (20 mg total) by mouth daily. 05/16/20   Sreenath, Sudheer B, MD  lidocaine (LIDODERM) 5 % Place 1 patch onto the skin daily. Remove & Discard patch within 12 hours or as directed by MD Patient not taking: Reported on 01/07/2021 05/15/20   Tresa Moore, MD  loperamide (IMODIUM) 2 MG capsule Take by mouth as needed for diarrhea or loose stools.    [provider]  metoprolol succinate (TOPROL-XL) 50 MG 24 hr tablet Take 50 mg by mouth daily. 03/24/16   [provider]  Multiple Vitamin (MULTIVITAMIN) capsule Take 1 capsule by mouth daily.    [provider]  PRADAXA 150 MG CAPS capsule Take 150 mg by mouth 2 (two) times daily. bid 02/23/18   [provider]    Physical Exam: Vitals:   05/10/22 1500 05/10/22 1530 05/10/22 1600 05/10/22 1700  BP: 136/74 (!) 143/75 139/65   Pulse: 77 76 78 82  Resp: 17 (!) 23 20 (!) 28  Temp:      TempSrc:      SpO2: 94% 97% 98% 98%  Weight:       GENERAL: Elderly female, confused laying in bed HEENT: Head atraumatic, normocephalic.  NECK:  Supple, no jugular venous distention.  LUNGS: Normal breath sounds bilaterally, no wheezing, rales,rhonchi or crepitation. No use of accessory muscles of respiration.  CARDIOVASCULAR: Irregular irregular tachycardic rhythm S1, S2 normal.  ABDOMEN: Soft, nondistended, nontender. Bowel sounds present. No organomegaly or mass.  EXTREMITIES: No pedal edema, cyanosis, or clubbing.  NEUROLOGIC: Obeys command however mildly confused SKIN: No obvious rash, lesion, or ulcer.   Data Reviewed: I have reviewed patient's laboratory results, CT scan of the brain as well as MRI as documented above  Assessment and Plan:  Acute metabolic encephalopathy likely secondary to dehydration Patient presented with dizziness, altered mental status appears clinically dry Placed on IV normal saline 75 mL/h Obtain orthostatic vitals to rule out orthostatic hypotension  Essential hypertension Continue metoprolol  Chronic persistent atrial fibrillation Continue metoprolol and Pradaxa  History of von Willebrand's disease-outpatient follow-up  DVT prophylaxis-continue Pradaxa    Advance Care Planning:   Code Status: DNR I discussed advance care planning with patient as well as patient's daughter present at bedside and at this point patient is DNI DNR  Consults: None  Family Communication: Patient's daughter present at bedside  Severity of Illness: The  appropriate patient status for this patient is OBSERVATION. Observation status is judged to be reasonable and necessary in order to provide the required intensity of service to ensure the patient's safety. The patient's presenting symptoms, physical exam findings, and initial radiographic and laboratory data in the context of their medical condition is felt to place them at decreased risk for further clinical deterioration. Furthermore, it is anticipated that the patient will be medically stable for discharge from the hospital within 2 midnights of admission.   Author: Loyce Dys, MD 05/10/2022 5:50 PM  For on call review [email or web address].

## 2022-05-10 NOTE — ED Notes (Addendum)
Orthostatic VS Laying 143/72, HR 83 Sitting 143/84, HR 90  Pt. Unable to stand.   Dr. Meriam Sprague notified.

## 2022-05-10 NOTE — ED Notes (Addendum)
When assessing visual acuity and field, pt. States she became very dizzy when looking to the right. MD notified.

## 2022-05-10 NOTE — ED Triage Notes (Signed)
Pt. To ED via EMS for altered mental status this morning. Family reports pt. Had a fall last night, denies head trauma. Pt. Oriented to place and self in triage.

## 2022-05-11 DIAGNOSIS — G9341 Metabolic encephalopathy: Secondary | ICD-10-CM | POA: Diagnosis present

## 2022-05-11 DIAGNOSIS — Z9181 History of falling: Secondary | ICD-10-CM | POA: Diagnosis not present

## 2022-05-11 DIAGNOSIS — D6800 Von Willebrand disease, unspecified: Secondary | ICD-10-CM | POA: Diagnosis present

## 2022-05-11 DIAGNOSIS — H8110 Benign paroxysmal vertigo, unspecified ear: Secondary | ICD-10-CM | POA: Diagnosis not present

## 2022-05-11 DIAGNOSIS — I10 Essential (primary) hypertension: Secondary | ICD-10-CM | POA: Diagnosis present

## 2022-05-11 DIAGNOSIS — Z79899 Other long term (current) drug therapy: Secondary | ICD-10-CM | POA: Diagnosis not present

## 2022-05-11 DIAGNOSIS — R4182 Altered mental status, unspecified: Secondary | ICD-10-CM | POA: Diagnosis present

## 2022-05-11 DIAGNOSIS — Z7902 Long term (current) use of antithrombotics/antiplatelets: Secondary | ICD-10-CM | POA: Diagnosis not present

## 2022-05-11 DIAGNOSIS — I4819 Other persistent atrial fibrillation: Secondary | ICD-10-CM | POA: Diagnosis present

## 2022-05-11 DIAGNOSIS — Z8052 Family history of malignant neoplasm of bladder: Secondary | ICD-10-CM | POA: Diagnosis not present

## 2022-05-11 DIAGNOSIS — Z90710 Acquired absence of both cervix and uterus: Secondary | ICD-10-CM | POA: Diagnosis not present

## 2022-05-11 DIAGNOSIS — G9340 Encephalopathy, unspecified: Secondary | ICD-10-CM | POA: Diagnosis not present

## 2022-05-11 DIAGNOSIS — Z87891 Personal history of nicotine dependence: Secondary | ICD-10-CM | POA: Diagnosis not present

## 2022-05-11 DIAGNOSIS — Z66 Do not resuscitate: Secondary | ICD-10-CM | POA: Diagnosis present

## 2022-05-11 DIAGNOSIS — Z7901 Long term (current) use of anticoagulants: Secondary | ICD-10-CM | POA: Diagnosis not present

## 2022-05-11 DIAGNOSIS — W19XXXA Unspecified fall, initial encounter: Secondary | ICD-10-CM | POA: Diagnosis present

## 2022-05-11 DIAGNOSIS — Z91018 Allergy to other foods: Secondary | ICD-10-CM | POA: Diagnosis not present

## 2022-05-11 DIAGNOSIS — S5002XA Contusion of left elbow, initial encounter: Secondary | ICD-10-CM | POA: Diagnosis present

## 2022-05-11 DIAGNOSIS — H8112 Benign paroxysmal vertigo, left ear: Secondary | ICD-10-CM | POA: Diagnosis present

## 2022-05-11 DIAGNOSIS — E860 Dehydration: Secondary | ICD-10-CM | POA: Diagnosis present

## 2022-05-11 LAB — CBC
HCT: 35 % — ABNORMAL LOW (ref 36.0–46.0)
Hemoglobin: 11.5 g/dL — ABNORMAL LOW (ref 12.0–15.0)
MCH: 30.7 pg (ref 26.0–34.0)
MCHC: 32.9 g/dL (ref 30.0–36.0)
MCV: 93.6 fL (ref 80.0–100.0)
Platelets: 301 10*3/uL (ref 150–400)
RBC: 3.74 MIL/uL — ABNORMAL LOW (ref 3.87–5.11)
RDW: 14 % (ref 11.5–15.5)
WBC: 5.7 10*3/uL (ref 4.0–10.5)
nRBC: 0 % (ref 0.0–0.2)

## 2022-05-11 LAB — BASIC METABOLIC PANEL
Anion gap: 8 (ref 5–15)
BUN: 19 mg/dL (ref 8–23)
CO2: 25 mmol/L (ref 22–32)
Calcium: 9.1 mg/dL (ref 8.9–10.3)
Chloride: 105 mmol/L (ref 98–111)
Creatinine, Ser: 0.71 mg/dL (ref 0.44–1.00)
GFR, Estimated: >60 mL/min (ref >60)
Glucose, Bld: 86 mg/dL (ref 70–99)
Potassium: 3.8 mmol/L (ref 3.5–5.1)
Sodium: 138 mmol/L (ref 135–145)

## 2022-05-11 LAB — CK: Total CK: 33 U/L — ABNORMAL LOW (ref 38–234)

## 2022-05-11 MED ORDER — SODIUM CHLORIDE 0.9 % IV SOLN: INTRAVENOUS | Status: DC

## 2022-05-11 MED ORDER — MECLIZINE HCL 25 MG PO TABS: 12.5000 mg | ORAL_TABLET | Freq: Three times a day (TID) | ORAL | Status: DC | PRN

## 2022-05-11 NOTE — Evaluation (Signed)
Physical Therapy Evaluation Patient Details Name: Tracy Singh MRN: [id number] DOB: Sep 29, 1924 Today's Date: 05/11/2022  History of Present Illness  Tracy Singh is a 89 y.o. female with medical history significant of atrial fibrillation on Pradaxa as well as von Willebrand's disease who presents with generalized dizziness and associated altered mental status.   Clinical Impression  Pt admitted with above diagnosis. Pt currently with functional limitations due to the deficits listed below (see PT Problem List). Pt received upright in recliner with daughter present. Both agreeable to PT eval. Noted concerns with BPPV sense fall at home and during admission. Pt HoH with daughter assisting in PLOF. At baseline pt is mod-I with RW in household and community. Family always present, but pt primarily indep with ADL's and some set up assist with meals from family and assist with IADL's.   To date, pt grossly minguard for transfers and supervision with gait using RW. Pt with consistent step through cadence completing ~80' with min VC's for upright posture to assist in visual scanning with good carryover. Pt able to continently void bladder in bathroom with minguard to stand from lowered bathroom toilet and performs pericare in standing with minguard support. Post toileting pt placed in long sitting in bed with positive R/L dix Hallpike for BPPV with R sided posterior up beating nystagmus lasting ~30 seconds. L testing positive but no nystagmus noted. Educated pt on benefits of OP vestibular rehab to address acute BPPV to ensure reduced falls risk. Pt and family in agreement. Education provided on mitigation strategies during transfers and gait to reduce falls risk. Pt and family understanding. Pt will benefit from skilled PT services and f/u recs to address her deficits to reduce falls risk and maintain independence.    Recommendations for follow up therapy are one component of a  multi-disciplinary discharge planning process, led by the attending physician.  Recommendations may be updated based on patient status, additional functional criteria and insurance authorization.     Assistance Recommended at Discharge Frequent or constant Supervision/Assistance  Patient can return home with the following  A little help with walking and/or transfers;A little help with bathing/dressing/bathroom;Assistance with cooking/housework;Assist for transportation;Help with stairs or ramp for entrance    Equipment Recommendations None recommended by PT  Recommendations for Other Services       Functional Status Assessment Patient has had a recent decline in their functional status and demonstrates the ability to make significant improvements in function in a reasonable and predictable amount of time.     Precautions / Restrictions Precautions Precautions: Fall Restrictions Weight Bearing Restrictions: No      Mobility  Bed Mobility               General bed mobility comments: NT. In recliner pre and post testing Patient Response: Cooperative  Transfers Overall transfer level: Needs assistance Equipment used: Rolling walker (2 wheels) Transfers: Sit to/from Stand Sit to Stand: Min guard                Ambulation/Gait Ambulation/Gait assistance: Min guard Gait Distance (Feet): 80 Feet Assistive device: Rolling walker (2 wheels) Gait Pattern/deviations: Step-through pattern, Trunk flexed       General Gait Details: safe use of RW with consistent step through pattern. Min VC's for cervical extension for environmental scanning  Stairs            Wheelchair Mobility    Modified Rankin (Stroke Patients Only)       Balance Overall balance assessment: Needs  assistance Sitting-balance support: Bilateral upper extremity supported, Feet supported Sitting balance-Leahy Scale: Fair     Standing balance support: Reliant on assistive device for  balance, Single extremity supported Standing balance-Leahy Scale: Good Standing balance comment: Able to perform standing pericare with SUE on support surface                             Pertinent Vitals/Pain Pain Assessment Pain Assessment: No/denies pain    Home Living Family/patient expects to be discharged to:: Private residence Living Arrangements: Children;Other relatives Available Help at Discharge: Family Type of Home: House Home Access: Stairs to enter Entrance Stairs-Rails: Left Entrance Stairs-Number of Steps: 3 Alternate Level Stairs-Number of Steps: stair lift Home Layout: Two level Home Equipment: Agricultural consultant (2 wheels);Shower seat;BSC/3in1;Grab bars - tub/shower      Prior Function Prior Level of Function : Independent/Modified Independent             Mobility Comments: MOD I for 3 STE and household distances, supervision for walking in neighbourhood. ADLs Comments: Light meal prep, does all bathing/dressing, wears depends     Hand Dominance        Extremity/Trunk Assessment   Upper Extremity Assessment Upper Extremity Assessment: Overall WFL for tasks assessed    Lower Extremity Assessment Lower Extremity Assessment: Generalized weakness    Cervical / Trunk Assessment Cervical / Trunk Assessment: Kyphotic  Communication   Communication: HOH  Cognition Arousal/Alertness: Awake/alert Behavior During Therapy: WFL for tasks assessed/performed Overall Cognitive Status: Within Functional Limits for tasks assessed                                          General Comments      Exercises Other Exercises Other Exercises: Positive R and L dix hallpike. R dix hallpike with posterior upbeating nystagmus with symptoms lasting ~30 seconds. Absent nystagmus with L side with less severe symptoms only last ~15-20 seconds.   Assessment/Plan    PT Assessment Patient needs continued PT services  PT Problem List Decreased  strength;Decreased balance       PT Treatment Interventions DME instruction;Therapeutic exercise;Gait training;Balance training;Stair training;Neuromuscular re-education;Functional mobility training;Therapeutic activities;Patient/family education    PT Goals (Current goals can be found in the Care Plan section)  Acute Rehab PT Goals Patient Stated Goal: To fix dizziness PT Goal Formulation: With patient/family Time For Goal Achievement: 05/25/22 Potential to Achieve Goals: Good    Frequency Min 3X/week     Co-evaluation               AM-PAC PT "6 Clicks" Mobility  Outcome Measure Help needed turning from your back to your side while in a flat bed without using bedrails?: A Little Help needed moving from lying on your back to sitting on the side of a flat bed without using bedrails?: A Little Help needed moving to and from a bed to a chair (including a wheelchair)?: A Little Help needed standing up from a chair using your arms (e.g., wheelchair or bedside chair)?: A Little Help needed to walk in hospital room?: A Little Help needed climbing 3-5 steps with a railing? : A Little 6 Click Score: 18    End of Session Equipment Utilized During Treatment: Gait belt Activity Tolerance: Patient tolerated treatment well Patient left: in bed;with call bell/phone within reach;with bed alarm set;with family/visitor present Nurse  Communication: Mobility status PT Visit Diagnosis: Muscle weakness (generalized) (M62.81);Dizziness and giddiness (R42);Other abnormalities of gait and mobility (R26.89)    Time: 1610-9604 PT Time Calculation (min) (ACUTE ONLY): 43 min   Charges:   PT Evaluation $PT Eval Moderate Complexity: 1 Mod PT Treatments $Therapeutic Activity: 8-22 mins $Neuromuscular Re-education: 8-22 mins        Glen Blatchley M. Fairly IV, PT, DPT Physical Therapist- [place]  [facility]  05/11/2022, 1:12 PM

## 2022-05-11 NOTE — TOC CM/SW Note (Signed)
     [facility] REFERRAL        Occupational Therapy * Physical Therapy * Speech Therapy         ** Referral for Vestibular Rehab                   DATE 05/11/2022  PATIENT NAME Tracy Singh   PATIENT MRN [id number]  Encephalopathy       DIAGNOSIS/DIAGNOSIS CODE G93.40    DATE OF DISCHARGE: 05/11/2022       PRIMARY CARE PHYSICIAN      PCP PHONE/FAX Jenell Milliner     Dear Provider (Name: [facility] outpatient __  Fax: [phone]   I certify that I have examined this patient and that occupational/physical/speech therapy is necessary on an outpatient basis.    The patient has expressed interest in completing their recommended course of therapy at your  location.  Once a formal order from the patient's primary care physician has been obtained, please  contact him/her to schedule an appointment for evaluation at your earliest convenience.   [x ]  Physical Therapy Evaluate and Treat  [ x ]  Occupational Therapy Evaluate and Treat  [  ]  Speech Therapy Evaluate and Treat         The patient's primary care physician (listed above) must furnish and be responsible for a formal order such that the recommended services may be furnished while under the primary physician's care, and that the plan of care will be established and reviewed every 30 days (or more often if condition necessitates).

## 2022-05-11 NOTE — TOC Initial Note (Signed)
Transition of Care [facility]) - Initial/Assessment Note    Patient Details  Name: Tracy Singh MRN: [id number] Date of Birth: 11-Oct-1924  [facility]) CM/SW Contact:    Allena Katz, LCSW Phone Number: 05/11/2022, 11:46 AM  Clinical Narrative:  PT admitted for Encephalopathy, CSW lvm for patients daughter.                   Pt's PCP is Jenell Milliner, MD. Recs are for [facility] for patient. CSW will follow up with patients daughter about [facility]      Patient Goals and CMS Choice            Expected Discharge Plan and Services                                              Prior Living Arrangements/Services                       Activities of Daily Living Home Assistive Devices/Equipment: Bedside commode/3-in-1, Dentures (specify type), Eyeglasses, Grab bars in shower, Hearing aid, Raised toilet seat with rails, Walker (specify type), Other (Comment) (Chair lift) ADL Screening (condition at time of admission) Patient's cognitive ability adequate to safely complete daily activities?: Yes Is the patient deaf or have difficulty hearing?: Yes Does the patient have difficulty seeing, even when wearing glasses/contacts?: Yes Does the patient have difficulty concentrating, remembering, or making decisions?: Yes Patient able to express need for assistance with ADLs?: Yes Does the patient have difficulty dressing or bathing?: No Independently performs ADLs?: Yes (appropriate for developmental age) Does the patient have difficulty walking or climbing stairs?: Yes Weakness of Legs: Both Weakness of Arms/Hands: Both  Permission Sought/Granted                  Emotional Assessment              Admission diagnosis:  Encephalopathy [G93.40] Altered mental status, unspecified altered mental status type [R41.82] Patient Active Problem List   Diagnosis Date Noted   Encephalopathy 05/10/2022   Atrial fibrillation with rapid ventricular response  05/05/2020   Diarrhea, functional 05/15/2018   Atrial fibrillation, chronic 03/10/2015   Essential hypertension 10/06/2013   Von Willebrand disease 10/06/2013   PCP:  Jenell Milliner, MD Pharmacy:   [facility] [phone] [place], Portal - [address] AT [facility] [address] [facility] [place] [address] Phone: [phone] Fax: [phone]     Social Determinants of Health (SDOH) Social History: SDOH Screenings   Food Insecurity: No Food Insecurity (05/10/2022)  Housing: Low Risk  (05/10/2022)  Transportation Needs: No Transportation Needs (05/10/2022)  Utilities: Not At Risk (05/10/2022)  Depression (PHQ2-9): Low Risk  (05/15/2018)  Tobacco Use: Medium Risk (05/10/2022)   SDOH Interventions: Food Insecurity Interventions: Intervention Not Indicated Housing Interventions: Intervention Not Indicated Transportation Interventions: Intervention Not Indicated Utilities Interventions: Intervention Not Indicated   Readmission Risk Interventions     No data to display

## 2022-05-11 NOTE — Evaluation (Addendum)
Occupational Therapy Evaluation Patient Details Name: Tracy Singh MRN: [id number] DOB: [birth year] Today's Date: 05/11/2022   History of Present Illness Tracy Singh is a 89 y.o. female with medical history significant of atrial fibrillation on Pradaxa as well as von Willebrand's disease who presents with generalized dizziness and associated altered mental status.   Clinical Impression   Ms Tracy Singh was seen for OT evaluation this date. Prior to hospital admission, pt was MOD I using RW for mobility and ADLs. Pt lives with family in 2 level home, chair lift for second floor access, 3 STE house. Pt presents to acute OT demonstrating impaired ADL performance and functional mobility 2/2 decreased activity tolerance and functional strength/balance deficits. A&O x4, follows 1 step commands, cues to avoid head turns during mobility.    Pt currently requires MIN A + RW sit<>stand and bed>chair t/f. No physical assist for bed mobility. MOD A doff underwear in standing, requires BUE support static standing. Pt would benefit from skilled OT to address noted impairments and functional limitations (see below for any additional details). Upon hospital discharge, recommend no follow up therapy.  Orthostatic VS for the past 24 hrs:  BP- Lying Pulse- Lying BP- Sitting Pulse- Sitting BP- Standing at 0 minutes Pulse- Standing at 0 minutes  05/11/22 1000 158/87 79 173/90 99 (!) 143/95 103       Recommendations for follow up therapy are one component of a multi-disciplinary discharge planning process, led by the attending physician.  Recommendations may be updated based on patient status, additional functional criteria and insurance authorization.   Assistance Recommended at Discharge Intermittent Supervision/Assistance  Patient can return home with the following A lot of help with walking and/or transfers;A lot of help with bathing/dressing/bathroom;Help with stairs or ramp for entrance     Functional Status Assessment  Patient has had a recent decline in their functional status and demonstrates the ability to make significant improvements in function in a reasonable and predictable amount of time.  Equipment Recommendations  BSC/3in1    Recommendations for Other Services       Precautions / Restrictions Precautions Precautions: Fall Restrictions Weight Bearing Restrictions: No      Mobility Bed Mobility Overal bed mobility: Needs Assistance Bed Mobility: Supine to Sit     Supine to sit: Supervision          Transfers Overall transfer level: Needs assistance Equipment used: Rolling walker (2 wheels) Transfers: Sit to/from Stand, Bed to chair/wheelchair/BSC Sit to Stand: Min assist     Step pivot transfers: Min assist            Balance Overall balance assessment: Needs assistance Sitting-balance support: Bilateral upper extremity supported, Feet supported Sitting balance-Leahy Scale: Fair     Standing balance support: Bilateral upper extremity supported, Reliant on assistive device for balance Standing balance-Leahy Scale: Fair                             ADL either performed or assessed with clinical judgement   ADL Overall ADL's : Needs assistance/impaired                                       General ADL Comments: MIN A + RW simulated BSC t/f. MOD A doff underwear in standing, requires BUE support static standing.      Pertinent Vitals/Pain Pain Assessment Pain  Assessment: No/denies pain     Hand Dominance     Extremity/Trunk Assessment Upper Extremity Assessment Upper Extremity Assessment: Overall WFL for tasks assessed   Lower Extremity Assessment Lower Extremity Assessment: Generalized weakness       Communication Communication Communication: HOH   Cognition Arousal/Alertness: Awake/alert Behavior During Therapy: WFL for tasks assessed/performed Overall Cognitive Status: Within  Functional Limits for tasks assessed                                 General Comments: A&Ox4                Home Living Family/patient expects to be discharged to:: Private residence Living Arrangements: Children;Other relatives Available Help at Discharge: Family Type of Home: House Home Access: Stairs to enter [employer] of Steps: 3 Entrance Stairs-Rails:  (1 rail) Home Layout: Two level Alternate Level Stairs-Number of Steps: stair lift             Home Equipment: Rolling Walker (2 wheels)          Prior Functioning/Environment Prior Level of Function : Independent/Modified Independent             Mobility Comments: MOD I for 3 STE and household distances, supervision for walking in neighbourhood. ADLs Comments: Light meal prep, does all bathing/dressing, wears depends        OT Problem List: Decreased strength;Decreased range of motion;Decreased activity tolerance;Impaired balance (sitting and/or standing);Decreased safety awareness      OT Treatment/Interventions: Self-care/ADL training;Therapeutic exercise;Energy conservation;DME and/or AE instruction;Therapeutic activities;Patient/family education;Balance training    OT Goals(Current goals can be found in the care plan section) Acute Rehab OT Goals Patient Stated Goal: to go home OT Goal Formulation: With patient/family Time For Goal Achievement: 05/25/22 Potential to Achieve Goals: Good ADL Goals Pt Will Perform Grooming: with modified independence;standing Pt Will Perform Lower Body Dressing: with modified independence;sit to/from stand Pt Will Transfer to Toilet: with modified independence;ambulating;regular height toilet  OT Frequency: Min 3X/week    Co-evaluation              AM-PAC OT "6 Clicks" Daily Activity     Outcome Measure Help from another person eating meals?: None Help from another person taking care of personal grooming?: A Little Help from  another person toileting, which includes using toliet, bedpan, or urinal?: A Little Help from another person bathing (including washing, rinsing, drying)?: A Lot Help from another person to put on and taking off regular upper body clothing?: A Little Help from another person to put on and taking off regular lower body clothing?: A Lot 6 Click Score: 17   End of Session Equipment Utilized During Treatment: Rolling walker (2 wheels);Gait belt Nurse Communication: Mobility status  Activity Tolerance: Patient tolerated treatment well Patient left: in chair;with call bell/phone within reach;with family/visitor present  OT Visit Diagnosis: Other abnormalities of gait and mobility (R26.89);Muscle weakness (generalized) (M62.81)                Time: 0981-1914 OT Time Calculation (min): 28 min Charges:  OT General Charges $OT Visit: 1 Visit OT Evaluation $OT Eval Low Complexity: 1 Low OT Treatments $Self Care/Home Management : 8-22 mins  Kathie Dike, M.S. OTR/L  05/11/22, 11:16 AM  ascom [phone]

## 2022-05-11 NOTE — Progress Notes (Signed)
[facility] Progress Note  Patient: Tracy Singh    ZOX:[id number]  DOA: 05/10/2022     Date of Service: the patient was seen and examined on 05/11/2022  Chief Complaint  Patient presents with   Altered Mental Status   Brief hospital course: Tracy Singh is a 89 y.o. female with PMH of Atrial fibrillation on Pradaxa as well as von Willebrand's disease who was otherwise well until this morning when she was found to have generalized dizziness with associated altered mental status.  ED w/up: CT scan head and MRI brain did not show any acute findings. Vitals within normal range, Blood glucose 125 BUN 27, rest of CMP within normal range.  CBC Hb 11.9, rest within normal range.  UA not very impressive.  Urine drug screen negative. Due to acute mental status changes and dizziness hospitalist was consulted for admission and further management.  Assessment and Plan:  Benign peripheral positional vertigo (BPPV) Dix-Hallpike test positive as per PT Patient was having dizziness while turning her head on the left side. Orthostatic BP decreased from sitting to standing and patient was also feeling dizzy.  Mental status improved back to her baseline Acute encephalopathy could be due to dizziness/BPPV Continue supportive care and fall precautions Started meclizine as needed Continue IV fluid for gentle hydration Monitor orthostatic vital signs Continue PT and OT   Chronic persistent A-fib and hypertension Continue metoprolol and Pradaxa Monitor BP and titrate medications accordingly  History of von Willebrand's disease-outpatient follow-up    Body mass index is 22.75 kg/m.  Interventions:     Diet: Heart healthy diet DVT Prophylaxis: Therapeutic Anticoagulation with Pradexa    Advance goals of care discussion: DNR  Family Communication: family was present at bedside, at the time of interview.  The pt provided permission to discuss medical plan with the family.  Opportunity was given to ask question and all questions were answered satisfactorily.   Disposition:  Pt is from Home, admitted with BPPV dizziness, Orthostatic, still has symptoms and high risk for fall, which precludes a safe discharge. Discharge to home with home health services, when clinically stable, most likely tomorrow a.m.  Subjective: No significant events overnight, patient was complaining of feeling dizzy while standing up and turning her head to the left side.  Denies any chest pain or palpitation, no shortness of breath, no any other active issues.  Physical Exam: General: NAD, lying comfortably Appear in no distress, affect appropriate Eyes: PERRLA ENT: Oral Mucosa Clear, moist  Neck: no JVD,  Cardiovascular: S1 and S2 Present, no Murmur, irrregular rhythm Respiratory: good respiratory effort, Bilateral Air entry equal and Decreased, no Crackles, no wheezes Abdomen: Bowel Sound present, Soft and no tenderness,  Skin: no rashes Extremities: no Pedal edema, no calf tenderness Neurologic: without any new focal findings Gait not checked due to patient safety concerns  Vitals:   05/10/22 1916 05/10/22 1938 05/11/22 0005 05/11/22 0447  BP: (!) 145/75 (!) 134/95 (!) 148/71 (!) 156/71  Pulse: 85 81 73 74  Resp: Temp: 98.5 F (36.9 C) 98 F (36.7 C) 98 F (36.7 C) 98.2 F (36.8 C)  TempSrc: Axillary Oral Oral Oral  SpO2: 97% 96% 96% 97%  Weight:      Height:        Intake/Output Summary (Last 24 hours) at 05/11/2022 1302 Last data filed at 05/11/2022 1025 Gross per 24 hour  Intake 969.2 ml  Output 525 ml  Net 444.2 ml  Filed Weights   05/10/22 1010  Weight: 56.4 kg    Data Reviewed: I have personally reviewed and interpreted daily labs, tele strips, imagings as discussed above. I reviewed all nursing notes, pharmacy notes, vitals, pertinent old records I have discussed plan of care as described above with RN and patient/family.  CBC: Recent  Labs  Lab 05/10/22 1011 05/11/22 0444  WBC 8.4 5.7  NEUTROABS 6.9  --   HGB 11.9* 11.5*  HCT 36.9 35.0*  MCV 96.6 93.6  PLT 295 301   Basic Metabolic Panel: Recent Labs  Lab 05/10/22 1011 05/11/22 0444  NA 139 138  K 4.2 3.8  CL 103 105  CO2 25 25  GLUCOSE 125* 86  BUN 27* 19  CREATININE 0.83 0.71  CALCIUM 9.5 9.1    Studies: MR BRAIN WO CONTRAST  Result Date: 05/10/2022 CLINICAL DATA:  Altered mental status, recent fall EXAM: MRI HEAD WITHOUT CONTRAST TECHNIQUE: Multiplanar, multiecho pulse sequences of the brain and surrounding structures were obtained without intravenous contrast. COMPARISON:  No prior MRI available, correlation is made with CT head 05/10/2022 FINDINGS: Brain: No restricted diffusion to suggest acute or subacute infarct. No acute hemorrhage, mass, mass effect, or midline shift. No hydrocephalus or extra-axial collection. Normal pituitary and craniocervical junction. No hemosiderin deposition to suggest remote hemorrhage. Age related cerebral volume loss. T2 hyperintense signal in the periventricular white matter, likely the sequela of mild-to-moderate chronic small vessel ischemic disease. Vascular: Normal arterial flow voids. Skull and upper cervical spine: Normal marrow signal. Sinuses/Orbits: Mucosal thickening in the ethmoid air cells. Status post left lens replacement. Other: The mastoid air cells are well aerated. IMPRESSION: No acute intracranial process. No evidence of acute or subacute infarct. Electronically Signed   By: Wiliam Ke M.D.   On: 05/10/2022 16:46    Scheduled Meds:  dabigatran  150 mg Oral BID   metoprolol succinate  50 mg Oral Daily   Continuous Infusions:  sodium chloride     PRN Meds: acetaminophen, albuterol, meclizine  Time spent: 35 minutes  Author: Gillis Santa. MD [facility] 05/11/2022 1:02 PM  To reach On-call, see care teams to locate the attending and reach out to them via [email or web address]. If 7PM-7AM, please  contact night-coverage If you still have difficulty reaching the attending provider, please page the [facility] (Director on Call) for [facility] on amion for assistance.

## 2022-05-11 NOTE — TOC Progression Note (Signed)
Transition of Care [facility]) - Progression Note    Patient Details  Name: Tracy Singh MRN: [id number] Date of Birth: [birth year]  Transition of Care [facility]) CM/SW Contact  Allena Katz, LCSW Phone Number: 05/11/2022, 1:48 PM  Clinical Narrative:   CSW spoke with patient's daughter about outpatient PT. Daughter reports she wants to follow up with [facility] to see if she can get  vestibular rehab there and if not she would like to go to [facility] outpatient. Referral sent to [facility] per daughter request.          Expected Discharge Plan and Services                                               Social Determinants of Health (SDOH) Interventions SDOH Screenings   Food Insecurity: No Food Insecurity (05/10/2022)  Housing: Low Risk  (05/10/2022)  Transportation Needs: No Transportation Needs (05/10/2022)  Utilities: Not At Risk (05/10/2022)  Depression (PHQ2-9): Low Risk  (05/15/2018)  Tobacco Use: Medium Risk (05/10/2022)    Readmission Risk Interventions     No data to display

## 2022-05-12 ENCOUNTER — Encounter: Payer: Self-pay | Admitting: Internal Medicine

## 2022-05-12 DIAGNOSIS — H8110 Benign paroxysmal vertigo, unspecified ear: Secondary | ICD-10-CM | POA: Insufficient documentation

## 2022-05-12 MED ORDER — DILTIAZEM HCL ER COATED BEADS 120 MG PO CP24
120.0000 mg | ORAL_CAPSULE | Freq: Every day | ORAL | Status: DC
  Administered 2022-05-12: 120 mg via ORAL
  Filled 2022-05-12: qty 1

## 2022-05-12 MED ORDER — MECLIZINE HCL 12.5 MG PO TABS: 12.5000 mg | ORAL_TABLET | Freq: Three times a day (TID) | ORAL | 0 refills | Status: AC | PRN

## 2022-05-12 NOTE — Discharge Summary (Signed)
[facility] Discharge Summary   Patient: Tracy Singh OZH:[id number]  PCP: Jenell Milliner, MD  Date of admission: 05/10/2022   Date of discharge: 05/12/2022 05/12/2022     Discharge Diagnoses:  Principal Problem:   BPPV (benign paroxysmal positional vertigo), unspecified laterality Active Problems:   Encephalopathy   Admitted From: Home Disposition:  Home with outpt PT/OT f/u  Recommendations for Outpatient Follow-up:  Follow with PCP in 1 week,  F/u with OP vestibular rehab to address acute BPPV F/u with ENT to check inner Ears for infection, wax and hearing loss Follow up LABS/TEST:     Follow-up Information     Jenell Milliner, MD. Go on 05/17/2022.   Specialty: Internal Medicine Why: @ 2:05pm Contact information: [address] [facility] [place] [address] [phone]                Diet recommendation: Cardiac diet  Activity: The patient is advised to gradually reintroduce usual activities, as tolerated  Discharge Condition: stable  Code Status: DNR   History of present illness: As per the H and P dictated on admission  Hospital Course:  JACCI RUBERG is a 89 y.o. female with PMH of Atrial fibrillation on Pradaxa as well as von Willebrand's disease who was otherwise well until this morning when she was found to have generalized dizziness with associated altered mental status.  ED w/up: CT scan head and MRI brain did not show any acute findings. Vitals within normal range, Blood glucose 125 BUN 27, rest of CMP within normal range.  CBC Hb 11.9, rest within normal range.  UA not very impressive.  Urine drug screen negative. Due to acute mental status changes and dizziness hospitalist was consulted for admission and further management.   Assessment and Plan: # Benign peripheral positional vertigo (BPPV) Dix-Hallpike test positive as per PT, Patient was having dizziness while turning her head on the left side.  Orthostatic BP decreased from sitting to standing and patient was also feeling dizzy.  Mental status improved back to her baseline Acute encephalopathy could be due to dizziness/BPPV. Continue supportive care and fall precautions. Started meclizine as needed. S/p IV fluid for gentle hydration.  Symptoms are improving, patient still feels dizzy while moving her head on the left side.  No any other active issues. Patient's daughter agreed with the discharge planning. F/u with outpatient vestibular rehab # Chronic persistent A-fib and hypertension, Continue metoprolol and Pradaxa # History of von Willebrand's disease-outpatient follow-up  Body mass index is 22.75 kg/m.  Nutrition Interventions:    Patient was seen by physical therapy, who recommended outpatient PT and OT, which was arranged. On the day of the discharge the patient's vitals were stable, and no other acute medical condition were reported by patient. the patient was felt safe to be discharge at Home with outpatient PT and OT.  Consultants: none Procedures: none  Discharge Exam: General: Appear in no distress, no Rash; Oral Mucosa Clear, moist. Cardiovascular: S1 and S2 Present, no Murmur, Respiratory: normal respiratory effort, Bilateral Air entry present and no Crackles, no wheezes Abdomen: Bowel Sound present, Soft and no tenderness, no hernia Extremities: no Pedal edema, no calf tenderness Neurology: alert and oriented to time, place, and person affect appropriate.  Filed Weights   05/10/22 1010  Weight: 56.4 kg   Vitals:   05/12/22 0407 05/12/22 0812  BP: (!) 161/87 (!) 173/79  Pulse: 96   Resp: 18 18  Temp: 97.9 F (36.6 C) 98.1 F (  36.7 C)  SpO2: 95% 96%    DISCHARGE MEDICATION: Allergies as of 05/12/2022   No Active Allergies      Medication List     STOP taking these medications    albuterol 108 (90 Base) MCG/ACT inhaler Commonly known as: VENTOLIN HFA   amoxicillin 500 MG capsule Commonly known  as: AMOXIL   lidocaine 5 % Commonly known as: LIDODERM       TAKE these medications    acetaminophen 325 MG tablet Commonly known as: TYLENOL Take 1-2 tablets by mouth daily as needed.   diltiazem 120 MG 24 hr capsule Commonly known as: CARDIZEM CD Take 1 capsule (120 mg total) by mouth daily.   furosemide 20 MG tablet Commonly known as: LASIX Take 1 tablet (20 mg total) by mouth daily.   loperamide 2 MG capsule Commonly known as: IMODIUM Take by mouth as needed for diarrhea or loose stools.   meclizine 12.5 MG tablet Commonly known as: ANTIVERT Take 1 tablet (12.5 mg total) by mouth 3 (three) times daily as needed for dizziness.   metoprolol succinate 50 MG 24 hr tablet Commonly known as: TOPROL-XL Take 50 mg by mouth daily.   multivitamin capsule Take 1 capsule by mouth daily.   Pradaxa 150 MG Caps capsule Generic drug: dabigatran Take 150 mg by mouth 2 (two) times daily. bid   Tymlos 3120 MCG/1.56ML Sopn Generic drug: Abaloparatide Inject 80 mcg into the skin daily.       No Active Allergies Discharge Instructions     Call MD for:  difficulty breathing, headache or visual disturbances   Complete by: As directed    Call MD for:  extreme fatigue   Complete by: As directed    Call MD for:  persistant dizziness or light-headedness   Complete by: As directed    Call MD for:  persistant nausea and vomiting   Complete by: As directed    Call MD for:  severe uncontrolled pain   Complete by: As directed    Diet - low sodium heart healthy   Complete by: As directed    Discharge instructions   Complete by: As directed    Follow with PCP in 1 week,  F/u with OP vestibular rehab to address acute BPPV F/u with ENT to check inner Ears for infection, wax and hearing loss   Increase activity slowly   Complete by: As directed        The results of significant diagnostics from this hospitalization (including imaging, microbiology, ancillary and laboratory)  are listed below for reference.    Significant Diagnostic Studies: MR BRAIN WO CONTRAST  Result Date: 05/10/2022 CLINICAL DATA:  Altered mental status, recent fall EXAM: MRI HEAD WITHOUT CONTRAST TECHNIQUE: Multiplanar, multiecho pulse sequences of the brain and surrounding structures were obtained without intravenous contrast. COMPARISON:  No prior MRI available, correlation is made with CT head 05/10/2022 FINDINGS: Brain: No restricted diffusion to suggest acute or subacute infarct. No acute hemorrhage, mass, mass effect, or midline shift. No hydrocephalus or extra-axial collection. Normal pituitary and craniocervical junction. No hemosiderin deposition to suggest remote hemorrhage. Age related cerebral volume loss. T2 hyperintense signal in the periventricular white matter, likely the sequela of mild-to-moderate chronic small vessel ischemic disease. Vascular: Normal arterial flow voids. Skull and upper cervical spine: Normal marrow signal. Sinuses/Orbits: Mucosal thickening in the ethmoid air cells. Status post left lens replacement. Other: The mastoid air cells are well aerated. IMPRESSION: No acute intracranial process. No evidence of acute  or subacute infarct. Electronically Signed   By: Wiliam Ke M.D.   On: 05/10/2022 16:46   CT Head Wo Contrast  Result Date: 05/10/2022 CLINICAL DATA:  Altered mental status EXAM: CT HEAD WITHOUT CONTRAST TECHNIQUE: Contiguous axial images were obtained from the base of the skull through the vertex without intravenous contrast. RADIATION DOSE REDUCTION: This exam was performed according to the departmental dose-optimization program which includes automated exposure control, adjustment of the mA and/or kV according to patient size and/or use of iterative reconstruction technique. COMPARISON:  CT head 08/31/20 FINDINGS: Brain: No evidence of acute infarction, hemorrhage, hydrocephalus, extra-axial collection or mass lesion/mass effect. Sequela of moderate chronic  microvascular ischemic change. Generalized volume loss. Vascular: No hyperdense vessel or unexpected calcification. Skull: Normal. Negative for fracture or focal lesion. Sinuses/Orbits: No middle ear or mastoid effusion. Paranasal sinuses are notable for mild mucosal thickening in the left sphenoid sinus. Left lens replacement. Other: None. IMPRESSION: No acute intracranial abnormality. Electronically Signed   By: Lorenza Cambridge M.D.   On: 05/10/2022 11:03    Microbiology: No results found for this or any previous visit (from the past 240 hour(s)).   Labs: CBC: Recent Labs  Lab 05/10/22 1011 05/11/22 0444  WBC 8.4 5.7  NEUTROABS 6.9  --   HGB 11.9* 11.5*  HCT 36.9 35.0*  MCV 96.6 93.6  PLT 295 301   Basic Metabolic Panel: Recent Labs  Lab 05/10/22 1011 05/11/22 0444  NA 139 138  K 4.2 3.8  CL 103 105  CO2 25 25  GLUCOSE 125* 86  BUN 27* 19  CREATININE 0.83 0.71  CALCIUM 9.5 9.1   Liver Function Tests: Recent Labs  Lab 05/10/22 1011  AST 25  ALT 13  ALKPHOS 100  BILITOT 0.6  PROT 7.1  ALBUMIN 3.8   No results for input(s): "LIPASE", "AMYLASE" in the last 168 hours. No results for input(s): "AMMONIA" in the last 168 hours. Cardiac Enzymes: Recent Labs  Lab 05/11/22 0442  CKTOTAL 33*   BNP (last 3 results) No results for input(s): "BNP" in the last 8760 hours. CBG: No results for input(s): "GLUCAP" in the last 168 hours.  Time spent: 35 minutes  Signed:  Gillis Santa  [facility] 05/12/2022  2:40 PM

## 2022-05-12 NOTE — TOC Transition Note (Signed)
Transition of Care [facility]) - CM/SW Discharge Note   Patient Details  Name: Tracy Singh MRN: [id number] Date of Birth: 1924/06/03  Transition of Care [facility]) CM/SW Contact:  Allena Katz, LCSW Phone Number: 05/12/2022, 11:10 AM   Clinical Narrative:   Pt discharging home with outpatient therapy services. CSW spoke with [place] outpatient therapy to confirm they received referral. CSW signing off.          Patient Goals and CMS Choice      Discharge Placement                         Discharge Plan and Services Additional resources added to the After Visit Summary for                                       Social Determinants of Health (SDOH) Interventions SDOH Screenings   Food Insecurity: No Food Insecurity (05/10/2022)  Housing: Low Risk  (05/10/2022)  Transportation Needs: No Transportation Needs (05/10/2022)  Utilities: Not At Risk (05/10/2022)  Depression (PHQ2-9): Low Risk  (05/15/2018)  Tobacco Use: Medium Risk (05/12/2022)     Readmission Risk Interventions     No data to display

## 2022-05-25 NOTE — Therapy (Signed)
OUTPATIENT PHYSICAL THERAPY VESTIBULAR EVALUATION  Patient Name: Tracy Singh MRN: [id number] DOB:[birth year], 89 y.o., female Today's Date: 05/27/2022  END OF SESSION:  PT End of Session - 05/26/22 1104     Visit Number 1    Number of Visits 17    Date for PT Re-Evaluation 07/21/22    Authorization Type eval: 05/26/22    PT Start Time 1105    PT Stop Time 1220    PT Time Calculation (min) 75 min    Activity Tolerance Patient tolerated treatment well    Behavior During Therapy [facility] for tasks assessed/performed            Past Medical History:  Diagnosis Date   A-fib (HCC)    Sepsis (HCC) 03/21/2018   Von Willebrand disease (HCC)    Past Surgical History:  Procedure Laterality Date   ABDOMINAL HYSTERECTOMY     Patient Active Problem List   Diagnosis Date Noted   BPPV (benign paroxysmal positional vertigo), unspecified laterality 05/12/2022   Encephalopathy 05/10/2022   Atrial fibrillation with rapid ventricular response (HCC) 05/05/2020   Diarrhea, functional 05/15/2018   Atrial fibrillation, chronic (HCC) 03/10/2015   Essential hypertension 10/06/2013   Von Willebrand disease (HCC) 10/06/2013   PCP: Jenell Milliner, MD  REFERRING PROVIDER: Jenell Milliner, MD   REFERRING DIAG: G93.40 (ICD-10-CM) - Encephalopathy, unspecified  RATIONALE FOR EVALUATION AND TREATMENT: Rehabilitation  THERAPY DIAG: Dizziness and giddiness  ONSET DATE: 05/20/22  FOLLOW-UP APPT SCHEDULED WITH REFERRING PROVIDER: Yes    SUBJECTIVE:   Chief Complaint:  Vertigo  Pertinent History The morning of 05/10/22 around 5:00 pt fell at home. Her daughter and granddaughter went upstairs and helped her back into bed and about 7:45 they heard patient calling because she was dizzy. Shortly after the dizziness started she became confused and per daughter referred to her as her mother instead of knowing that she is her daughter.  Given the persistent dizziness and confusion EMS was called and  patient was taken to the ED. CT scan head and MRI brain did not show any acute findings. Vitals within normal range. Blood glucose 125, BUN 27, rest of CMP within normal range.  CBC Hb 11.9, UA not very impressive, and UDS negative. Due to acute mental status changes and dizziness hospitalist admitted patient for further management. During admission PT was consulted and stated "positive R/L dix Hallpike for BPPV with R sided posterior up beating nystagmus lasting ~30 seconds. L testing positive but no nystagmus noted." Patient's mental status returned to baseline and she was advised to follow-up with OP vestibular PT.  PMH includes multi-level lumbar compression fractures, a-fib, Von Willebrand disease, and HTN.   Description of dizziness: vertigo Frequency: Every morning with position changes; Duration: 5-10 minutes Symptom nature: positional Progression of symptoms since onset: no change History of similar episodes: Yes, several years ago pt had a short bout of vertigo. Daughter in law is a PT instructed daughter in CRT and symptoms resolved.  Provocative Factors: rolling in bed toward both sides; Easing Factors: Meclizine and waiting for symptoms to pass;  Auditory complaints (tinnitus, pain, drainage, hearing loss, aural fullness): No Vision changes (diplopia, visual field loss, recent changes, recent eye exam): No Chest pain/palpitations: No, history of afib but no current complaints. History of head injury/concussion: No, however pt did fall and it is unclear whether she might have struck her head or not; Migraines/headaches: Yes, history of headaches and migraines. No migraines in recent years Nausea/vomiting: Yes, pt has experienced  nausea and vomiting with vertigo Numbness/tingling: No Focal weakness: No Dysarthria/dysphagia/drop attacks: Yes, during initial episode daughter reported some slurring of patient's speech but now resolved.   Has patient fallen in last 6 months? Yes, Number  of falls: 1 Pertinent pain: No, history of multi-level compression fractures in lumbar spine Imaging: Yes, see history Prior level of function: Needs assistance with ADLs and Needs assistance with homemaking  PRECAUTIONS: None  WEIGHT BEARING RESTRICTIONS No  LIVING ENVIRONMENT: Lives with: lives with their family Lives in: House/apartment Stairs: Yes; stair lift to second level; Has following equipment at home: Dan Humphreys - 2 wheeled, shower chair, bed side commode, and Grab bars  PATIENT GOALS: Decrease dizziness in order to improve balance and decrease fall risk   OBJECTIVE EXAMINATION  POSTURE: Severe upper thoracic kyphosis noted;  CRANIAL NERVES II, III, IV, VI: Pupils equal and reactive to light, visual acuity and visual fields are intact, extraocular muscles are intact  VII: Facial strength is intact and symmetric bilaterally  VIII: Pt is very hard of hearing, wears hearing aids;  COORDINATION Deferred   RANGE OF MOTION Cervical Spine AROM is severely limited in extension, moderately limited in rotation bilaterally.   MANUAL MUSCLE TESTING BUE/BLE strength WNL without focal deficits  TRANSFERS/GAIT Independent for transfers and ambulation without assistive device   PATIENT SURVEYS FOTO: 50, predicted improvement to 62 ABC: To be completed DHI: To be completed  OCULOMOTOR / VESTIBULAR TESTING  Oculomotor Exam- Room Light  Findings Comments  Ocular Alignment normal   Ocular ROM abnormal Decreased vertical gaze noted and difficulty with finger follow due to lack of attention;  Spontaneous Nystagmus normal   Gaze-Holding Nystagmus normal   End-Gaze Nystagmus normal   Vergence (normal 2-3") not examined   Smooth Pursuit abnormal Saccadic  Cross-Cover Test not examined   Saccades not examined Pt struggles with understanding commands  VOR Cancellation not examined   Left Head Impulse Unable to tolerate   Right Head Impulse Unable to tolerate   Static Acuity  not examined   Dynamic Acuity not examined    Oculomotor Exam- Fixation Suppressed: Deferred  BPPV TESTS:  Symptoms Duration Intensity Nystagmus  L Dix-Hallpike Dizziness and vomiting >1 minute Severe None  R Dix-Hallpike Dizziness and vomiting >1 minute Severe None  L Head Roll Dizziness and vomiting >1 minute Severe None  R Head Roll Dizziness and vomiting >1 minute Severe None  L Sidelying Test      R Sidelying Test      (blank = not tested)  Clinical Test of Sensory Interaction for Balance (CTSIB): Deferred  FUNCTIONAL OUTCOME MEASURES  Results Comments  BERG    DGI    FGA    TUG    5TSTS    6 Minute Walk Test    10 Meter Gait Speed    (blank = not tested)   TODAY'S TREATMENT  Deferred   PATIENT EDUCATION:  Education details: Plan of care, BPPV Person educated: Patient and daughter Education method: Explanation and handout Education comprehension: verbalized understanding   HOME EXERCISE PROGRAM:  None currently   ASSESSMENT: CLINICAL IMPRESSION: Patient is a 89 y.o. female who was seen today for physical therapy evaluation and treatment for vertigo. Examination is limited today due to history and mobility deficits. Pt becomes dizzy and vomits in all BPPV test positions performed today (bilateral Dix-Hallpike and bilateral Roll Test). Symptoms are brief (minutes) and positional in nature however no nystagmus observed in any position. Due to nausea and vomiting as  well as mobility difficulty it is challenging to use infrared goggles during examination. Pt does become lethargic and slightly confused after episodes of vomiting however vitals remain stable and confusion passess after 5-10 minutes. Given history and examination it appears that symptoms are most likely related to BPPV however since she is positive in all positions and no nystagmus observed it is difficult to determine which CRT maneuver to use for treatment. Will initiate CRT at next appointment choosing  to start with the most consistently and severely symptomatic side and assess response to intervention.   OBJECTIVE IMPAIRMENTS: Abnormal gait, decreased balance, decreased mobility, difficulty walking, decreased strength, dizziness, and postural dysfunction.   ACTIVITY LIMITATIONS: bending, standing, squatting, and bed mobility  PARTICIPATION LIMITATIONS: meal prep, cleaning, laundry, and community activity  PERSONAL FACTORS: Age, Fitness, Past/current experiences, Time since onset of injury/illness/exacerbation, and 3+ comorbidities: compression fractures, a-fib, migraines, HTN, and Von Willebrand disease  are also affecting patient's functional outcome.   REHAB POTENTIAL: Fair    CLINICAL DECISION MAKING: Unstable/unpredictable  EVALUATION COMPLEXITY: High   GOALS:  SHORT TERM GOALS: Target date: 06/23/2022  Pt will be independent with HEP for dizziness in order to decrease symptoms, improve balance,decrease fall risk, and improve function at home. Baseline: Goal status: INITIAL   LONG TERM GOALS: Target date: 07/21/2022  Pt will increase FOTO to at least 62 to demonstrate significant improvement in function at home related to dizziness.  Baseline: 05/26/22: 50 Goal status: INITIAL  2.  Pt will decrease DHI score by at least 18 points in order to demonstrate clinically significant reduction in disability related to dizziness.  Baseline: 05/26/22: To be completed  Goal status: INITIAL  3.  Pt will improve ABC by at least 13% in order to demonstrate clinically significant improvement in balance confidence.      Baseline: 05/26/22: To be completed Goal status: INITIAL  4. Pt will report 50% improvement in vertigo symptoms in order to decrease fall risk and improve her ability to roll in bed without triggering symptoms.        Baseline:  Goal status: INITIAL   PLAN: PT FREQUENCY: 1-2x/week  PT DURATION: 8 weeks  PLANNED INTERVENTIONS: Therapeutic exercises, Therapeutic  activity, Neuromuscular re-education, Balance training, Gait training, Patient/Family education, Self Care, Joint mobilization, Joint manipulation, Vestibular training, Canalith repositioning, Orthotic/Fit training, DME instructions, Dry Needling, Electrical stimulation, Spinal manipulation, Spinal mobilization, Cryotherapy, Moist heat, Taping, Traction, Ultrasound, Ionotophoresis 4mg /ml Dexamethasone, Manual therapy, and Re-evaluation.  PLAN FOR NEXT SESSION: Pt to complete ABC and DHI, strength and sensation testing, coordination testing, finish cranial nerve screen, retest BPPV and initiate CRT as indicated, once BPPV clear perform additional fixation suppression and balance testing.    Sharalyn Ink Chaquetta Schlottman PT, DPT, GCS  Caius Silbernagel, PT 05/27/2022, 9:21 AM

## 2022-05-26 ENCOUNTER — Ambulatory Visit: Payer: Medicare Other | Attending: Specialist

## 2022-05-26 DIAGNOSIS — R42 Dizziness and giddiness: Secondary | ICD-10-CM | POA: Diagnosis present

## 2022-05-30 NOTE — Therapy (Unsigned)
OUTPATIENT PHYSICAL THERAPY VESTIBULAR TREATMENT  Patient Name: Tracy Singh MRN: [id number] DOB:04-30-1924, 89 y.o., female Today's Date: 06/02/2022  END OF SESSION:  PT End of Session - 06/02/22 1114     Visit Number 2    Number of Visits 17    Date for PT Re-Evaluation 07/21/22    Authorization Type eval: 05/26/22    PT Start Time 1100    PT Stop Time 1145    PT Time Calculation (min) 45 min    Activity Tolerance Patient tolerated treatment well    Behavior During Therapy [facility] for tasks assessed/performed            Past Medical History:  Diagnosis Date   A-fib (HCC)    Sepsis (HCC) 03/21/2018   Von Willebrand disease (HCC)    Past Surgical History:  Procedure Laterality Date   ABDOMINAL HYSTERECTOMY     Patient Active Problem List   Diagnosis Date Noted   BPPV (benign paroxysmal positional vertigo), unspecified laterality 05/12/2022   Encephalopathy 05/10/2022   Atrial fibrillation with rapid ventricular response (HCC) 05/05/2020   Diarrhea, functional 05/15/2018   Atrial fibrillation, chronic (HCC) 03/10/2015   Essential hypertension 10/06/2013   Von Willebrand disease (HCC) 10/06/2013   PCP: Jenell Milliner, MD  REFERRING PROVIDER: Jenell Milliner, MD   REFERRING DIAG: G93.40 (ICD-10-CM) - Encephalopathy, unspecified  RATIONALE FOR EVALUATION AND TREATMENT: Rehabilitation  THERAPY DIAG: Dizziness and giddiness  ONSET DATE: 05/20/22  FOLLOW-UP APPT SCHEDULED WITH REFERRING PROVIDER: Yes   FROM INITIAL EVALUATION SUBJECTIVE:   Chief Complaint:  Vertigo  Pertinent History The morning of 05/10/22 around 5:00 pt fell at home. Her daughter and granddaughter went upstairs and helped her back into bed and about 7:45 they heard patient calling because she was dizzy. Shortly after the dizziness started she became confused and per daughter referred to her as her mother instead of knowing that she is her daughter.  Given the persistent dizziness and  confusion EMS was called and patient was taken to the ED. CT scan head and MRI brain did not show any acute findings. Vitals within normal range. Blood glucose 125, BUN 27, rest of CMP within normal range.  CBC Hb 11.9, UA not very impressive, and UDS negative. Due to acute mental status changes and dizziness hospitalist admitted patient for further management. During admission PT was consulted and stated "positive R/L dix Hallpike for BPPV with R sided posterior up beating nystagmus lasting ~30 seconds. L testing positive but no nystagmus noted." Patient's mental status returned to baseline and she was advised to follow-up with OP vestibular PT.  PMH includes multi-level lumbar compression fractures, a-fib, Von Willebrand disease, and HTN.   Description of dizziness: vertigo Frequency: Every morning with position changes; Duration: 5-10 minutes Symptom nature: positional Progression of symptoms since onset: no change History of similar episodes: Yes, several years ago pt had a short bout of vertigo. Daughter in law is a PT instructed daughter in CRT and symptoms resolved.  Provocative Factors: rolling in bed toward both sides; Easing Factors: Meclizine and waiting for symptoms to pass;  Auditory complaints (tinnitus, pain, drainage, hearing loss, aural fullness): No Vision changes (diplopia, visual field loss, recent changes, recent eye exam): No Chest pain/palpitations: No, history of afib but no current complaints. History of head injury/concussion: No, however pt did fall and it is unclear whether she might have struck her head or not; Migraines/headaches: Yes, history of headaches and migraines. No migraines in recent years Nausea/vomiting: Yes, pt  has experienced nausea and vomiting with vertigo Numbness/tingling: No Focal weakness: No Dysarthria/dysphagia/drop attacks: Yes, during initial episode daughter reported some slurring of patient's speech but now resolved.   Has patient fallen  in last 6 months? Yes, Number of falls: 1 Pertinent pain: No, history of multi-level compression fractures in lumbar spine Imaging: Yes, see history Prior level of function: Needs assistance with ADLs and Needs assistance with homemaking  PRECAUTIONS: None  WEIGHT BEARING RESTRICTIONS No  LIVING ENVIRONMENT: Lives with: lives with their family Lives in: House/apartment Stairs: Yes; stair lift to second level; Has following equipment at home: Dan Humphreys - 2 wheeled, shower chair, bed side commode, and Grab bars  PATIENT GOALS: Decrease dizziness in order to improve balance and decrease fall risk   OBJECTIVE EXAMINATION  POSTURE: Severe upper thoracic kyphosis noted;  CRANIAL NERVES II, III, IV, VI: Pupils equal and reactive to light, visual acuity and visual fields are intact, extraocular muscles are intact  VII: Facial strength is intact and symmetric bilaterally  VIII: Pt is very hard of hearing, wears hearing aids;  COORDINATION Deferred   RANGE OF MOTION Cervical Spine AROM is severely limited in extension, moderately limited in rotation bilaterally.   MANUAL MUSCLE TESTING BUE/BLE strength WNL without focal deficits  TRANSFERS/GAIT Independent for transfers and ambulation without assistive device   PATIENT SURVEYS FOTO: 50, predicted improvement to 62 ABC: To be completed DHI: To be completed  OCULOMOTOR / VESTIBULAR TESTING  Oculomotor Exam- Room Light  Findings Comments  Ocular Alignment normal   Ocular ROM abnormal Decreased vertical gaze noted and difficulty with finger follow due to lack of attention;  Spontaneous Nystagmus normal   Gaze-Holding Nystagmus normal   End-Gaze Nystagmus normal   Vergence (normal 2-3") not examined   Smooth Pursuit abnormal Saccadic  Cross-Cover Test not examined   Saccades not examined Pt struggles with understanding commands  VOR Cancellation not examined   Left Head Impulse Unable to tolerate   Right Head Impulse Unable  to tolerate   Static Acuity not examined   Dynamic Acuity not examined    Oculomotor Exam- Fixation Suppressed: Deferred  BPPV TESTS:  Symptoms Duration Intensity Nystagmus  L Dix-Hallpike Dizziness and vomiting >1 minute Severe None  R Dix-Hallpike Dizziness and vomiting >1 minute Severe None  L Head Roll Dizziness and vomiting >1 minute Severe None  R Head Roll Dizziness and vomiting >1 minute Severe None  L Sidelying Test      R Sidelying Test      (blank = not tested)  Clinical Test of Sensory Interaction for Balance (CTSIB): Deferred  FUNCTIONAL OUTCOME MEASURES  Results Comments  BERG    DGI    FGA    TUG    5TSTS    6 Minute Walk Test    10 Meter Gait Speed    (blank = not tested)   TODAY'S TREATMENT    SUBJECTIVE: Daughter reports that pt is doing well today. They saw neurology who felt like the patient is depressed but otherwise didn't have any concerns regarding her cognition. Dizziness has been improving since the initial evaluation and pt is reporting less symptoms with rolling.   PAIN: No pertinent pain;   Canalith Repositioning Treatment Dix-Hallpike negative for both vertigo and nystagmus. Pt treated with 2 bouts of Epley Maneuver for possible L posterior canal BPPV given initial examination findings. One minute holds in each position and retesting between maneuvers. Retesting is negative for vertigo and nystagmus. Afterwards ambulated pt 100' in  hallway to ensure safety with walker.    PATIENT EDUCATION:  Education details: Plan of care Person educated: Patient and daughter Education method: Explanation Education comprehension: verbalized understanding   HOME EXERCISE PROGRAM:  None currently   ASSESSMENT: CLINICAL IMPRESSION: Dix-Hallpike negative for both vertigo and nystagmus. Pt treated with 2 bouts of Epley Maneuver for possible L posterior canal BPPV given initial examination findings. One minute holds in each position and retesting between  maneuvers. Retesting is negative for vertigo and nystagmus. Afterwards ambulated pt 100' in hallway to ensure safety with walker. Plan to continue testing for BPPV. If pt remains asymptomatic during testing at next session will take pt through Epley for R side. Pt will benefit from PT services to address deficits in strength, balance, dizziness, and mobility in order to return to full function at home.   OBJECTIVE IMPAIRMENTS: Abnormal gait, decreased balance, decreased mobility, difficulty walking, decreased strength, dizziness, and postural dysfunction.   ACTIVITY LIMITATIONS: bending, standing, squatting, and bed mobility  PARTICIPATION LIMITATIONS: meal prep, cleaning, laundry, and community activity  PERSONAL FACTORS: Age, Fitness, Past/current experiences, Time since onset of injury/illness/exacerbation, and 3+ comorbidities: compression fractures, a-fib, migraines, HTN, and Von Willebrand disease  are also affecting patient's functional outcome.   REHAB POTENTIAL: Fair    CLINICAL DECISION MAKING: Unstable/unpredictable  EVALUATION COMPLEXITY: High   GOALS:  SHORT TERM GOALS: Target date: 06/23/2022  Pt will be independent with HEP for dizziness in order to decrease symptoms, improve balance,decrease fall risk, and improve function at home. Baseline: Goal status: INITIAL   LONG TERM GOALS: Target date: 07/21/2022  Pt will increase FOTO to at least 62 to demonstrate significant improvement in function at home related to dizziness.  Baseline: 05/26/22: 50 Goal status: INITIAL  2.  Pt will decrease DHI score by at least 18 points in order to demonstrate clinically significant reduction in disability related to dizziness.  Baseline: 05/26/22: To be completed  Goal status: INITIAL  3.  Pt will improve ABC by at least 13% in order to demonstrate clinically significant improvement in balance confidence.      Baseline: 05/26/22: To be completed Goal status: INITIAL  4. Pt will report  50% improvement in vertigo symptoms in order to decrease fall risk and improve her ability to roll in bed without triggering symptoms.        Baseline:  Goal status: INITIAL   PLAN: PT FREQUENCY: 1-2x/week  PT DURATION: 8 weeks  PLANNED INTERVENTIONS: Therapeutic exercises, Therapeutic activity, Neuromuscular re-education, Balance training, Gait training, Patient/Family education, Self Care, Joint mobilization, Joint manipulation, Vestibular training, Canalith repositioning, Orthotic/Fit training, DME instructions, Dry Needling, Electrical stimulation, Spinal manipulation, Spinal mobilization, Cryotherapy, Moist heat, Taping, Traction, Ultrasound, Ionotophoresis 4mg /ml Dexamethasone, Manual therapy, and Re-evaluation.  PLAN FOR NEXT SESSION: Pt to complete ABC and DHI, strength and sensation testing, coordination testing, finish cranial nerve screen, retest BPPV and initiate CRT as indicated, once BPPV clear perform additional fixation suppression and balance testing.    Sharalyn Ink Lasean Rahming PT, DPT, GCS  Miyah Hampshire, PT 06/02/2022, 11:19 AM

## 2022-05-31 ENCOUNTER — Ambulatory Visit: Payer: Medicare Other

## 2022-05-31 DIAGNOSIS — R42 Dizziness and giddiness: Secondary | ICD-10-CM

## 2022-06-03 ENCOUNTER — Ambulatory Visit: Payer: Medicare Other

## 2022-06-03 DIAGNOSIS — R42 Dizziness and giddiness: Secondary | ICD-10-CM

## 2022-06-03 NOTE — Therapy (Signed)
OUTPATIENT PHYSICAL THERAPY VESTIBULAR TREATMENT  Patient Name: Tracy Singh MRN: [id number] DOB:April 10, 1924, 89 y.o., female Today's Date: 06/03/2022  END OF SESSION:  PT End of Session - 06/03/22 1103     Visit Number 3    Number of Visits 17    Date for PT Re-Evaluation 07/21/22    Authorization Type eval: 05/26/22    PT Start Time 1105    PT Stop Time 1140    PT Time Calculation (min) 35 min    Activity Tolerance Patient tolerated treatment well    Behavior During Therapy [facility] for tasks assessed/performed            Past Medical History:  Diagnosis Date   A-fib (HCC)    Sepsis (HCC) 03/21/2018   Von Willebrand disease (HCC)    Past Surgical History:  Procedure Laterality Date   ABDOMINAL HYSTERECTOMY     Patient Active Problem List   Diagnosis Date Noted   BPPV (benign paroxysmal positional vertigo), unspecified laterality 05/12/2022   Encephalopathy 05/10/2022   Atrial fibrillation with rapid ventricular response (HCC) 05/05/2020   Diarrhea, functional 05/15/2018   Atrial fibrillation, chronic (HCC) 03/10/2015   Essential hypertension 10/06/2013   Von Willebrand disease (HCC) 10/06/2013   PCP: Jenell Milliner, MD  REFERRING PROVIDER: Jenell Milliner, MD   REFERRING DIAG: G93.40 (ICD-10-CM) - Encephalopathy, unspecified  RATIONALE FOR EVALUATION AND TREATMENT: Rehabilitation  THERAPY DIAG: Dizziness and giddiness  ONSET DATE: 05/20/22  FOLLOW-UP APPT SCHEDULED WITH REFERRING PROVIDER: Yes   FROM INITIAL EVALUATION SUBJECTIVE:   Chief Complaint:  Vertigo  Pertinent History The morning of 05/10/22 around 5:00 pt fell at home. Her daughter and granddaughter went upstairs and helped her back into bed and about 7:45 they heard patient calling because she was dizzy. Shortly after the dizziness started she became confused and per daughter referred to her as her mother instead of knowing that she is her daughter.  Given the persistent dizziness and  confusion EMS was called and patient was taken to the ED. CT scan head and MRI brain did not show any acute findings. Vitals within normal range. Blood glucose 125, BUN 27, rest of CMP within normal range.  CBC Hb 11.9, UA not very impressive, and UDS negative. Due to acute mental status changes and dizziness hospitalist admitted patient for further management. During admission PT was consulted and stated "positive R/L dix Hallpike for BPPV with R sided posterior up beating nystagmus lasting ~30 seconds. L testing positive but no nystagmus noted." Patient's mental status returned to baseline and she was advised to follow-up with OP vestibular PT.  PMH includes multi-level lumbar compression fractures, a-fib, Von Willebrand disease, and HTN.   Description of dizziness: vertigo Frequency: Every morning with position changes; Duration: 5-10 minutes Symptom nature: positional Progression of symptoms since onset: no change History of similar episodes: Yes, several years ago pt had a short bout of vertigo. Daughter in law is a PT instructed daughter in CRT and symptoms resolved.  Provocative Factors: rolling in bed toward both sides; Easing Factors: Meclizine and waiting for symptoms to pass;  Auditory complaints (tinnitus, pain, drainage, hearing loss, aural fullness): No Vision changes (diplopia, visual field loss, recent changes, recent eye exam): No Chest pain/palpitations: No, history of afib but no current complaints. History of head injury/concussion: No, however pt did fall and it is unclear whether she might have struck her head or not; Migraines/headaches: Yes, history of headaches and migraines. No migraines in recent years Nausea/vomiting: Yes, pt  has experienced nausea and vomiting with vertigo Numbness/tingling: No Focal weakness: No Dysarthria/dysphagia/drop attacks: Yes, during initial episode daughter reported some slurring of patient's speech but now resolved.   Has patient fallen  in last 6 months? Yes, Number of falls: 1 Pertinent pain: No, history of multi-level compression fractures in lumbar spine Imaging: Yes, see history Prior level of function: Needs assistance with ADLs and Needs assistance with homemaking  PRECAUTIONS: None  WEIGHT BEARING RESTRICTIONS No  LIVING ENVIRONMENT: Lives with: lives with their family Lives in: House/apartment Stairs: Yes; stair lift to second level; Has following equipment at home: Dan Humphreys - 2 wheeled, shower chair, bed side commode, and Grab bars  PATIENT GOALS: Decrease dizziness in order to improve balance and decrease fall risk   OBJECTIVE EXAMINATION  POSTURE: Severe upper thoracic kyphosis noted;  CRANIAL NERVES II, III, IV, VI: Pupils equal and reactive to light, visual acuity and visual fields are intact, extraocular muscles are intact  VII: Facial strength is intact and symmetric bilaterally  VIII: Pt is very hard of hearing, wears hearing aids;  COORDINATION Deferred   RANGE OF MOTION Cervical Spine AROM is severely limited in extension, moderately limited in rotation bilaterally.   MANUAL MUSCLE TESTING BUE/BLE strength WNL without focal deficits  TRANSFERS/GAIT Independent for transfers and ambulation without assistive device   PATIENT SURVEYS FOTO: 50, predicted improvement to 62 ABC: To be completed DHI: To be completed  OCULOMOTOR / VESTIBULAR TESTING  Oculomotor Exam- Room Light  Findings Comments  Ocular Alignment normal   Ocular ROM abnormal Decreased vertical gaze noted and difficulty with finger follow due to lack of attention;  Spontaneous Nystagmus normal   Gaze-Holding Nystagmus normal   End-Gaze Nystagmus normal   Vergence (normal 2-3") not examined   Smooth Pursuit abnormal Saccadic  Cross-Cover Test not examined   Saccades not examined Pt struggles with understanding commands  VOR Cancellation not examined   Left Head Impulse Unable to tolerate   Right Head Impulse Unable  to tolerate   Static Acuity not examined   Dynamic Acuity not examined    Oculomotor Exam- Fixation Suppressed: Deferred  BPPV TESTS:  Symptoms Duration Intensity Nystagmus  L Dix-Hallpike Dizziness and vomiting >1 minute Severe None  R Dix-Hallpike Dizziness and vomiting >1 minute Severe None  L Head Roll Dizziness and vomiting >1 minute Severe None  R Head Roll Dizziness and vomiting >1 minute Severe None  L Sidelying Test      R Sidelying Test      (blank = not tested)  Clinical Test of Sensory Interaction for Balance (CTSIB): Deferred  FUNCTIONAL OUTCOME MEASURES  Results Comments  BERG    DGI    FGA    TUG    5TSTS    6 Minute Walk Test    10 Meter Gait Speed    (blank = not tested)   TODAY'S TREATMENT    SUBJECTIVE: Daughter reports that pt is doing well today. She felt very unwell after the last therapy session and the morning after. She had the same reaction with confusion and slowed movement that she has had in the past. Symptoms eventually improved and dtr states that today is the most clarity she has noted in patient in a long time.    PAIN: No pertinent pain;   Canalith Repositioning Treatment L Dix-Hallpike negative for both vertigo and nystagmus. R Dix-Hallpike positive for both vertigo and 3-5 beats of R torsional nystagmus (hard to note upbeating component). Pt treated with 2  bouts of Epley Maneuver for possible R posterior canal BPPV. One minute holds in each position and retesting between maneuvers. Retesting is negative for vertigo and nystagmus.    PATIENT EDUCATION:  Education details: Plan of care Person educated: Patient and daughter Education method: Explanation Education comprehension: verbalized understanding   HOME EXERCISE PROGRAM:  None currently   ASSESSMENT: CLINICAL IMPRESSION: L Dix-Hallpike negative for both vertigo and nystagmus. R Dix-Hallpike positive for both vertigo and 3-5 beats of R torsional nystagmus (hard to note  upbeating component). Pt treated with 2 bouts of Epley Maneuver for possible R posterior canal BPPV. One minute holds in each position and retesting between maneuvers. Retesting is negative for vertigo and nystagmus. Plan to continue testing for BPPV. If pt remains asymptomatic during testing at next session will take pt through Epley for R side. Pt will benefit from PT services to address deficits in strength, balance, dizziness, and mobility in order to return to full function at home.   OBJECTIVE IMPAIRMENTS: Abnormal gait, decreased balance, decreased mobility, difficulty walking, decreased strength, dizziness, and postural dysfunction.   ACTIVITY LIMITATIONS: bending, standing, squatting, and bed mobility  PARTICIPATION LIMITATIONS: meal prep, cleaning, laundry, and community activity  PERSONAL FACTORS: Age, Fitness, Past/current experiences, Time since onset of injury/illness/exacerbation, and 3+ comorbidities: compression fractures, a-fib, migraines, HTN, and Von Willebrand disease  are also affecting patient's functional outcome.   REHAB POTENTIAL: Fair    CLINICAL DECISION MAKING: Unstable/unpredictable  EVALUATION COMPLEXITY: High   GOALS:  SHORT TERM GOALS: Target date: 06/23/2022  Pt will be independent with HEP for dizziness in order to decrease symptoms, improve balance,decrease fall risk, and improve function at home. Baseline: Goal status: INITIAL   LONG TERM GOALS: Target date: 07/21/2022  Pt will increase FOTO to at least 62 to demonstrate significant improvement in function at home related to dizziness.  Baseline: 05/26/22: 50 Goal status: INITIAL  2.  Pt will decrease DHI score by at least 18 points in order to demonstrate clinically significant reduction in disability related to dizziness.  Baseline: 05/26/22: To be completed  Goal status: INITIAL  3.  Pt will improve ABC by at least 13% in order to demonstrate clinically significant improvement in balance  confidence.      Baseline: 05/26/22: To be completed Goal status: INITIAL  4. Pt will report 50% improvement in vertigo symptoms in order to decrease fall risk and improve her ability to roll in bed without triggering symptoms.        Baseline:  Goal status: INITIAL   PLAN: PT FREQUENCY: 1-2x/week  PT DURATION: 8 weeks  PLANNED INTERVENTIONS: Therapeutic exercises, Therapeutic activity, Neuromuscular re-education, Balance training, Gait training, Patient/Family education, Self Care, Joint mobilization, Joint manipulation, Vestibular training, Canalith repositioning, Orthotic/Fit training, DME instructions, Dry Needling, Electrical stimulation, Spinal manipulation, Spinal mobilization, Cryotherapy, Moist heat, Taping, Traction, Ultrasound, Ionotophoresis 4mg /ml Dexamethasone, Manual therapy, and Re-evaluation.  PLAN FOR NEXT SESSION: Pt to complete ABC and DHI, strength and sensation testing, coordination testing, finish cranial nerve screen, retest BPPV and initiate CRT as indicated, once BPPV clear perform additional fixation suppression and balance testing.    Sharalyn Ink Khanh Tanori PT, DPT, GCS  Marria Mathison, PT 06/03/2022, 11:42 AM

## 2022-06-09 ENCOUNTER — Ambulatory Visit: Payer: Medicare Other

## 2022-06-09 DIAGNOSIS — R42 Dizziness and giddiness: Secondary | ICD-10-CM

## 2022-06-09 NOTE — Therapy (Signed)
OUTPATIENT PHYSICAL THERAPY VESTIBULAR TREATMENT  Patient Name: Tracy Singh MRN: [id number] DOB:17-Apr-1924, 89 y.o., female Today's Date: 06/09/2022  END OF SESSION:  PT End of Session - 06/09/22 1107     Visit Number 4    Number of Visits 17    Date for PT Re-Evaluation 07/21/22    Authorization Type eval: 05/26/22    PT Start Time 1107    PT Stop Time 1145    PT Time Calculation (min) 38 min    Activity Tolerance Patient tolerated treatment well    Behavior During Therapy [facility] for tasks assessed/performed            Past Medical History:  Diagnosis Date   A-fib (HCC)    Sepsis (HCC) 03/21/2018   Von Willebrand disease (HCC)    Past Surgical History:  Procedure Laterality Date   ABDOMINAL HYSTERECTOMY     Patient Active Problem List   Diagnosis Date Noted   BPPV (benign paroxysmal positional vertigo), unspecified laterality 05/12/2022   Encephalopathy 05/10/2022   Atrial fibrillation with rapid ventricular response (HCC) 05/05/2020   Diarrhea, functional 05/15/2018   Atrial fibrillation, chronic (HCC) 03/10/2015   Essential hypertension 10/06/2013   Von Willebrand disease (HCC) 10/06/2013   PCP: Jenell Milliner, MD  REFERRING PROVIDER: Jenell Milliner, MD   REFERRING DIAG: G93.40 (ICD-10-CM) - Encephalopathy, unspecified  RATIONALE FOR EVALUATION AND TREATMENT: Rehabilitation  THERAPY DIAG: Dizziness and giddiness  ONSET DATE: 05/20/22  FOLLOW-UP APPT SCHEDULED WITH REFERRING PROVIDER: Yes   FROM INITIAL EVALUATION SUBJECTIVE:   Chief Complaint:  Vertigo  Pertinent History The morning of 05/10/22 around 5:00 pt fell at home. Her daughter and granddaughter went upstairs and helped her back into bed and about 7:45 they heard patient calling because she was dizzy. Shortly after the dizziness started she became confused and per daughter referred to her as her mother instead of knowing that she is her daughter.  Given the persistent dizziness and  confusion EMS was called and patient was taken to the ED. CT scan head and MRI brain did not show any acute findings. Vitals within normal range. Blood glucose 125, BUN 27, rest of CMP within normal range.  CBC Hb 11.9, UA not very impressive, and UDS negative. Due to acute mental status changes and dizziness hospitalist admitted patient for further management. During admission PT was consulted and stated "positive R/L dix Hallpike for BPPV with R sided posterior up beating nystagmus lasting ~30 seconds. L testing positive but no nystagmus noted." Patient's mental status returned to baseline and she was advised to follow-up with OP vestibular PT.  PMH includes multi-level lumbar compression fractures, a-fib, Von Willebrand disease, and HTN.   Description of dizziness: vertigo Frequency: Every morning with position changes; Duration: 5-10 minutes Symptom nature: positional Progression of symptoms since onset: no change History of similar episodes: Yes, several years ago pt had a short bout of vertigo. Daughter in law is a PT instructed daughter in CRT and symptoms resolved.  Provocative Factors: rolling in bed toward both sides; Easing Factors: Meclizine and waiting for symptoms to pass;  Auditory complaints (tinnitus, pain, drainage, hearing loss, aural fullness): No Vision changes (diplopia, visual field loss, recent changes, recent eye exam): No Chest pain/palpitations: No, history of afib but no current complaints. History of head injury/concussion: No, however pt did fall and it is unclear whether she might have struck her head or not; Migraines/headaches: Yes, history of headaches and migraines. No migraines in recent years Nausea/vomiting: Yes, pt  has experienced nausea and vomiting with vertigo Numbness/tingling: No Focal weakness: No Dysarthria/dysphagia/drop attacks: Yes, during initial episode daughter reported some slurring of patient's speech but now resolved.   Has patient fallen  in last 6 months? Yes, Number of falls: 1 Pertinent pain: No, history of multi-level compression fractures in lumbar spine Imaging: Yes, see history Prior level of function: Needs assistance with ADLs and Needs assistance with homemaking  PRECAUTIONS: None  WEIGHT BEARING RESTRICTIONS No  LIVING ENVIRONMENT: Lives with: lives with their family Lives in: House/apartment Stairs: Yes; stair lift to second level; Has following equipment at home: Dan Humphreys - 2 wheeled, shower chair, bed side commode, and Grab bars  PATIENT GOALS: Decrease dizziness in order to improve balance and decrease fall risk   OBJECTIVE EXAMINATION  POSTURE: Severe upper thoracic kyphosis noted;  CRANIAL NERVES II, III, IV, VI: Pupils equal and reactive to light, visual acuity and visual fields are intact, extraocular muscles are intact  VII: Facial strength is intact and symmetric bilaterally  VIII: Pt is very hard of hearing, wears hearing aids;  COORDINATION Deferred   RANGE OF MOTION Cervical Spine AROM is severely limited in extension, moderately limited in rotation bilaterally.   MANUAL MUSCLE TESTING BUE/BLE strength WNL without focal deficits  TRANSFERS/GAIT Independent for transfers and ambulation without assistive device   PATIENT SURVEYS FOTO: 50, predicted improvement to 62 ABC: To be completed DHI: To be completed  OCULOMOTOR / VESTIBULAR TESTING  Oculomotor Exam- Room Light  Findings Comments  Ocular Alignment normal   Ocular ROM abnormal Decreased vertical gaze noted and difficulty with finger follow due to lack of attention;  Spontaneous Nystagmus normal   Gaze-Holding Nystagmus normal   End-Gaze Nystagmus normal   Vergence (normal 2-3") not examined   Smooth Pursuit abnormal Saccadic  Cross-Cover Test not examined   Saccades not examined Pt struggles with understanding commands  VOR Cancellation not examined   Left Head Impulse Unable to tolerate   Right Head Impulse Unable  to tolerate   Static Acuity not examined   Dynamic Acuity not examined    Oculomotor Exam- Fixation Suppressed: Deferred  BPPV TESTS:  Symptoms Duration Intensity Nystagmus  L Dix-Hallpike Dizziness and vomiting >1 minute Severe None  R Dix-Hallpike Dizziness and vomiting >1 minute Severe None  L Head Roll Dizziness and vomiting >1 minute Severe None  R Head Roll Dizziness and vomiting >1 minute Severe None  L Sidelying Test      R Sidelying Test      (blank = not tested)  Clinical Test of Sensory Interaction for Balance (CTSIB): Deferred  FUNCTIONAL OUTCOME MEASURES  Results Comments  BERG    DGI    FGA    TUG    5TSTS    6 Minute Walk Test    10 Meter Gait Speed    (blank = not tested)   TODAY'S TREATMENT    SUBJECTIVE: Daughter reports that pt is doing well with respect to her dizziness. She had not experienced dizziness since the last therapy session until this morning when she reported a recurrence when getting out of bed. No excessive fatigue or confusion after the last therapy session. Per daugther the patient has struggled with daily motivation which is frustrating/challenging for family.     PAIN: No pertinent pain;   Canalith Repositioning Treatment L Dix-Hallpike negative for both vertigo and nystagmus. R Dix-Hallpike positive for vertigo but negative for nystagmus. Pt treated with 2 bouts of Epley Maneuver for possible R posterior  canal BPPV. One minute holds in each position and retesting between maneuvers. Retesting is negative for both vertigo and nystagmus.   After CRT pt taken to rehab gym and utilized NuStep L2 x 7 minutes for general conditioning (unbilled);   PATIENT EDUCATION:  Education details: Plan of care Person educated: Patient and daughter Education method: Explanation Education comprehension: verbalized understanding   HOME EXERCISE PROGRAM:  None currently   ASSESSMENT: CLINICAL IMPRESSION: L Dix-Hallpike negative for both  vertigo and nystagmus. R Dix-Hallpike positive for vertigo but negative for nystagmus. Pt treated with 2 bouts of Epley Maneuver for possible R posterior canal BPPV. One minute holds in each position and retesting between maneuvers. Retesting is negative for both vertigo and nystagmus. After CRT pt taken to rehab gym and utilized NuStep L2 x 7 minutes for general conditioning (unbilled). Plan to continue testing for BPPV and treat as indicated. Will consider additional balance/strength exercises to see if this is helpful for patient's energy, balance, and to decrease fall risk. Pt will benefit from PT services to address deficits in strength, balance, dizziness, and mobility in order to return to full function at home.   OBJECTIVE IMPAIRMENTS: Abnormal gait, decreased balance, decreased mobility, difficulty walking, decreased strength, dizziness, and postural dysfunction.   ACTIVITY LIMITATIONS: bending, standing, squatting, and bed mobility  PARTICIPATION LIMITATIONS: meal prep, cleaning, laundry, and community activity  PERSONAL FACTORS: Age, Fitness, Past/current experiences, Time since onset of injury/illness/exacerbation, and 3+ comorbidities: compression fractures, a-fib, migraines, HTN, and Von Willebrand disease  are also affecting patient's functional outcome.   REHAB POTENTIAL: Fair    CLINICAL DECISION MAKING: Unstable/unpredictable  EVALUATION COMPLEXITY: High   GOALS:  SHORT TERM GOALS: Target date: 06/23/2022  Pt will be independent with HEP for dizziness in order to decrease symptoms, improve balance,decrease fall risk, and improve function at home. Baseline: Goal status: INITIAL   LONG TERM GOALS: Target date: 07/21/2022  Pt will increase FOTO to at least 62 to demonstrate significant improvement in function at home related to dizziness.  Baseline: 05/26/22: 50 Goal status: INITIAL  2.  Pt will decrease DHI score by at least 18 points in order to demonstrate clinically  significant reduction in disability related to dizziness.  Baseline: 05/26/22: To be completed  Goal status: INITIAL  3.  Pt will improve ABC by at least 13% in order to demonstrate clinically significant improvement in balance confidence.      Baseline: 05/26/22: To be completed Goal status: INITIAL  4. Pt will report 50% improvement in vertigo symptoms in order to decrease fall risk and improve her ability to roll in bed without triggering symptoms.        Baseline:  Goal status: INITIAL   PLAN: PT FREQUENCY: 1-2x/week  PT DURATION: 8 weeks  PLANNED INTERVENTIONS: Therapeutic exercises, Therapeutic activity, Neuromuscular re-education, Balance training, Gait training, Patient/Family education, Self Care, Joint mobilization, Joint manipulation, Vestibular training, Canalith repositioning, Orthotic/Fit training, DME instructions, Dry Needling, Electrical stimulation, Spinal manipulation, Spinal mobilization, Cryotherapy, Moist heat, Taping, Traction, Ultrasound, Ionotophoresis 4mg /ml Dexamethasone, Manual therapy, and Re-evaluation.  PLAN FOR NEXT SESSION: Pt to complete ABC and DHI, strength and sensation testing, coordination testing, finish cranial nerve screen, retest BPPV and initiate CRT as indicated, once BPPV clear perform additional fixation suppression and balance testing.    Sharalyn Ink Cniyah Sproull PT, DPT, GCS  Tashawnda Bleiler, PT 06/09/2022, 4:01 PM

## 2022-06-15 NOTE — Therapy (Signed)
OUTPATIENT PHYSICAL THERAPY VESTIBULAR TREATMENT  Patient Name: Tracy Singh MRN: [id number] DOB:[birth year], 89 y.o., female Today's Date: 06/17/2022  END OF SESSION:  PT End of Session - 06/17/22 1052     Visit Number 5    Number of Visits 17    Date for PT Re-Evaluation 07/21/22    Authorization Type eval: 05/26/22    PT Start Time 1100    PT Stop Time 1145    PT Time Calculation (min) 45 min    Activity Tolerance Patient tolerated treatment well    Behavior During Therapy [facility] for tasks assessed/performed            Past Medical History:  Diagnosis Date   A-fib (HCC)    Sepsis (HCC) 03/21/2018   Von Willebrand disease (HCC)    Past Surgical History:  Procedure Laterality Date   ABDOMINAL HYSTERECTOMY     Patient Active Problem List   Diagnosis Date Noted   BPPV (benign paroxysmal positional vertigo), unspecified laterality 05/12/2022   Encephalopathy 05/10/2022   Atrial fibrillation with rapid ventricular response (HCC) 05/05/2020   Diarrhea, functional 05/15/2018   Atrial fibrillation, chronic (HCC) 03/10/2015   Essential hypertension 10/06/2013   Von Willebrand disease (HCC) 10/06/2013   PCP: Jenell Milliner, MD  REFERRING PROVIDER: Jenell Milliner, MD   REFERRING DIAG: G93.40 (ICD-10-CM) - Encephalopathy, unspecified  RATIONALE FOR EVALUATION AND TREATMENT: Rehabilitation  THERAPY DIAG: Dizziness and giddiness  ONSET DATE: 05/20/22  FOLLOW-UP APPT SCHEDULED WITH REFERRING PROVIDER: Yes   FROM INITIAL EVALUATION SUBJECTIVE:   Chief Complaint:  Vertigo  Pertinent History The morning of 05/10/22 around 5:00 pt fell at home. Her daughter and granddaughter went upstairs and helped her back into bed and about 7:45 they heard patient calling because she was dizzy. Shortly after the dizziness started she became confused and per daughter referred to her as her mother instead of knowing that she is her daughter.  Given the persistent dizziness and  confusion EMS was called and patient was taken to the ED. CT scan head and MRI brain did not show any acute findings. Vitals within normal range. Blood glucose 125, BUN 27, rest of CMP within normal range.  CBC Hb 11.9, UA not very impressive, and UDS negative. Due to acute mental status changes and dizziness hospitalist admitted patient for further management. During admission PT was consulted and stated "positive R/L dix Hallpike for BPPV with R sided posterior up beating nystagmus lasting ~30 seconds. L testing positive but no nystagmus noted." Patient's mental status returned to baseline and she was advised to follow-up with OP vestibular PT.  PMH includes multi-level lumbar compression fractures, a-fib, Von Willebrand disease, and HTN.   Description of dizziness: vertigo Frequency: Every morning with position changes; Duration: 5-10 minutes Symptom nature: positional Progression of symptoms since onset: no change History of similar episodes: Yes, several years ago pt had a short bout of vertigo. Daughter in law is a PT instructed daughter in CRT and symptoms resolved.  Provocative Factors: rolling in bed toward both sides; Easing Factors: Meclizine and waiting for symptoms to pass;  Auditory complaints (tinnitus, pain, drainage, hearing loss, aural fullness): No Vision changes (diplopia, visual field loss, recent changes, recent eye exam): No Chest pain/palpitations: No, history of afib but no current complaints. History of head injury/concussion: No, however pt did fall and it is unclear whether she might have struck her head or not; Migraines/headaches: Yes, history of headaches and migraines. No migraines in recent years Nausea/vomiting: Yes, pt  has experienced nausea and vomiting with vertigo Numbness/tingling: No Focal weakness: No Dysarthria/dysphagia/drop attacks: Yes, during initial episode daughter reported some slurring of patient's speech but now resolved.   Has patient fallen  in last 6 months? Yes, Number of falls: 1 Pertinent pain: No, history of multi-level compression fractures in lumbar spine Imaging: Yes, see history Prior level of function: Needs assistance with ADLs and Needs assistance with homemaking  PRECAUTIONS: None  WEIGHT BEARING RESTRICTIONS No  LIVING ENVIRONMENT: Lives with: lives with their family Lives in: House/apartment Stairs: Yes; stair lift to second level; Has following equipment at home: Dan Humphreys - 2 wheeled, shower chair, bed side commode, and Grab bars  PATIENT GOALS: Decrease dizziness in order to improve balance and decrease fall risk   OBJECTIVE EXAMINATION  POSTURE: Severe upper thoracic kyphosis noted;  CRANIAL NERVES II, III, IV, VI: Pupils equal and reactive to light, visual acuity and visual fields are intact, extraocular muscles are intact  VII: Facial strength is intact and symmetric bilaterally  VIII: Pt is very hard of hearing, wears hearing aids;  COORDINATION Deferred   RANGE OF MOTION Cervical Spine AROM is severely limited in extension, moderately limited in rotation bilaterally.   MANUAL MUSCLE TESTING BUE/BLE strength WNL without focal deficits  TRANSFERS/GAIT Independent for transfers and ambulation without assistive device   PATIENT SURVEYS FOTO: 50, predicted improvement to 62 ABC: To be completed DHI: To be completed  OCULOMOTOR / VESTIBULAR TESTING  Oculomotor Exam- Room Light  Findings Comments  Ocular Alignment normal   Ocular ROM abnormal Decreased vertical gaze noted and difficulty with finger follow due to lack of attention;  Spontaneous Nystagmus normal   Gaze-Holding Nystagmus normal   End-Gaze Nystagmus normal   Vergence (normal 2-3") not examined   Smooth Pursuit abnormal Saccadic  Cross-Cover Test not examined   Saccades not examined Pt struggles with understanding commands  VOR Cancellation not examined   Left Head Impulse Unable to tolerate   Right Head Impulse Unable  to tolerate   Static Acuity not examined   Dynamic Acuity not examined    Oculomotor Exam- Fixation Suppressed: Deferred  BPPV TESTS:  Symptoms Duration Intensity Nystagmus  L Dix-Hallpike Dizziness and vomiting >1 minute Severe None  R Dix-Hallpike Dizziness and vomiting >1 minute Severe None  L Head Roll Dizziness and vomiting >1 minute Severe None  R Head Roll Dizziness and vomiting >1 minute Severe None  L Sidelying Test      R Sidelying Test      (blank = not tested)  Clinical Test of Sensory Interaction for Balance (CTSIB): Deferred  FUNCTIONAL OUTCOME MEASURES  Results Comments  BERG    DGI    FGA    TUG    5TSTS    6 Minute Walk Test    10 Meter Gait Speed    (blank = not tested)   TODAY'S TREATMENT    SUBJECTIVE: Daughter reports that pt is doing well with respect to her dizziness. She has not had any episodes of dizziness since the last therapy session. However pt reports a couple mild episodes of dizziness during interview. Daughter took pt to the gym and she was able to exercise on the recumbent bike and with weights. Per daugther the patient has continued to struggle with daily motivation which is frustrating/challenging for family.     PAIN: No pertinent pain;   Neuromuscular Re-education   FUNCTIONAL OUTCOME MEASURES  Results Comments  BERG 42/56   TUG 18.0s   5TSTS  24.9s   6 Minute Walk Test Deferred   10 Meter Gait Speed Self-selected: 17.8s = 0.56 m/s, fastest: 13.3s = 0.75 m/s   (blank = not tested)  Stairs: Reciprocal pattern with bilateral hand rails   Ther-ex  Pre-exercise Vitals: Seated: BP: 133/56 mmHg, HR: 82 bpm, SpO2: 100%  NuStep L1-3 x 10 minutes for warm-up and LE strengthening with therapist monitoring and adjusting resistance (5 minutes unbilled);  UE MMT: MMT (out of 5) Right  Left   Shoulder   Flexion 4+ 4+  Extension    Abduction 4+ 4+  External rotation 5 4+  Internal rotation 5 4+      Elbow  Flexion 4 4   Extension 5 5  Pronation    Supination        Wrist  Flexion 5 5  Extension 5 5  Radial deviation    Ulnar deviation        MCP  Flexion 5 5  Extension 5 5  Abduction 5 5  Adduction 5 5  (* = pain; Blank rows = not tested)  LE MMT: MMT (out of 5) Right  Left   Hip flexion 4+ 4+  Hip extension    Hip abduction (seated) 5 5  Hip adduction (seated) 5 5  Hip internal rotation    Hip external rotation    Knee flexion 4+ 4+  Knee extension 5 5  Ankle dorsiflexion 4 4  Ankle plantarflexion Strong Strong  (* = pain; Blank rows = not tested)  Sit to stand without UE support x 10; Extensive conversation with patient and daughter about gym and home-based exercises, plan to compline into HEP at next visit;   PATIENT EDUCATION:  Education details: Plan of care and outcome measures, HEP and gym-based exercises Person educated: Patient and daughter Education method: Explanation Education comprehension: verbalized understanding   HOME EXERCISE PROGRAM:  Continue current home/gym program for strength/balance;   ASSESSMENT: CLINICAL IMPRESSION: Performed additional outcome measures with patient. She is a high fall risk scoring 42/56 on the BERG. She also demonstrates decreased LE strength performing Five Time Sit to Stand in 24.9s and TUG is 18.0s. Her 38m gait speed is below normative values for full community ambulation. Low balance confidence with pt scoring 25.6% on the ABC Scale. Extensive conversation with patient and daughter about gym and home-based exercises she can perform. Encouraged avoiding abdominal exercises which load a flexed spine due to history of lumbar compression fractures. Plan is to compline exercises into a HEP at future visits as well as progress balance and strength exercises. Pt will benefit from PT services to address deficits in strength, balance, dizziness, and mobility in order to return to full function at home and decrease her risk for  falls.  OBJECTIVE IMPAIRMENTS: Abnormal gait, decreased balance, decreased mobility, difficulty walking, decreased strength, dizziness, and postural dysfunction.   ACTIVITY LIMITATIONS: bending, standing, squatting, and bed mobility  PARTICIPATION LIMITATIONS: meal prep, cleaning, laundry, and community activity  PERSONAL FACTORS: Age, Fitness, Past/current experiences, Time since onset of injury/illness/exacerbation, and 3+ comorbidities: compression fractures, a-fib, migraines, HTN, and Von Willebrand disease  are also affecting patient's functional outcome.   REHAB POTENTIAL: Fair    CLINICAL DECISION MAKING: Unstable/unpredictable  EVALUATION COMPLEXITY: High   GOALS:  SHORT TERM GOALS: Target date: 06/23/2022  Pt will be independent with HEP for dizziness in order to decrease symptoms, improve balance,decrease fall risk, and improve function at home. Baseline: Goal status: INITIAL   LONG TERM GOALS: Target  date: 07/21/2022  Pt will increase FOTO to at least 62 to demonstrate significant improvement in function at home related to dizziness.  Baseline: 05/26/22: 50 Goal status: INITIAL  2.  Pt will decrease DHI score by at least 18 points in order to demonstrate clinically significant reduction in disability related to dizziness.  Baseline: 05/26/22: To be completed  Goal status: INITIAL  3.  Pt will improve ABC by at least 13% in order to demonstrate clinically significant improvement in balance confidence.      Baseline: 05/26/22: To be completed; 06/17/22: 25.6% Goal status: INITIAL  4. Pt will report 50% improvement in vertigo symptoms in order to decrease fall risk and improve her ability to roll in bed without triggering symptoms.        Baseline:  Goal status: INITIAL   PLAN: PT FREQUENCY: 1-2x/week  PT DURATION: 8 weeks  PLANNED INTERVENTIONS: Therapeutic exercises, Therapeutic activity, Neuromuscular re-education, Balance training, Gait training, Patient/Family  education, Self Care, Joint mobilization, Joint manipulation, Vestibular training, Canalith repositioning, Orthotic/Fit training, DME instructions, Dry Needling, Electrical stimulation, Spinal manipulation, Spinal mobilization, Cryotherapy, Moist heat, Taping, Traction, Ultrasound, Ionotophoresis 4mg /ml Dexamethasone, Manual therapy, and Re-evaluation.  PLAN FOR NEXT SESSION: retest BPPV and perform CRT as indicated, progress balance and strength exercises.   Sharalyn Ink Janey Petron PT, DPT, GCS  Tracy Singh, PT 06/17/2022, 1:35 PM

## 2022-06-17 ENCOUNTER — Ambulatory Visit: Payer: Medicare Other

## 2022-06-17 DIAGNOSIS — R42 Dizziness and giddiness: Secondary | ICD-10-CM

## 2022-06-24 ENCOUNTER — Ambulatory Visit: Payer: Medicare Other

## 2022-06-24 DIAGNOSIS — R42 Dizziness and giddiness: Secondary | ICD-10-CM

## 2022-06-24 NOTE — Therapy (Signed)
OUTPATIENT PHYSICAL THERAPY VESTIBULAR TREATMENT  Patient Name: Tracy Singh MRN: [id number] DOB:11/05/1924, 89 y.o., female Today's Date: 06/24/2022  END OF SESSION:  PT End of Session - 06/24/22 1013     Visit Number 6    Number of Visits 17    Date for PT Re-Evaluation 07/21/22    Authorization Type eval: 05/26/22    PT Start Time 1015    PT Stop Time 1100    PT Time Calculation (min) 45 min    Activity Tolerance Patient tolerated treatment well    Behavior During Therapy [place] for tasks assessed/performed            Past Medical History:  Diagnosis Date   A-fib (HCC)    Sepsis (HCC) 03/21/2018   Von Willebrand disease (HCC)    Past Surgical History:  Procedure Laterality Date   ABDOMINAL HYSTERECTOMY     Patient Active Problem List   Diagnosis Date Noted   BPPV (benign paroxysmal positional vertigo), unspecified laterality 05/12/2022   Encephalopathy 05/10/2022   Atrial fibrillation with rapid ventricular response (HCC) 05/05/2020   Diarrhea, functional 05/15/2018   Atrial fibrillation, chronic (HCC) 03/10/2015   Essential hypertension 10/06/2013   Von Willebrand disease (HCC) 10/06/2013   PCP: Jenell Milliner, MD  REFERRING PROVIDER: Jenell Milliner, MD   REFERRING DIAG: G93.40 (ICD-10-CM) - Encephalopathy, unspecified  RATIONALE FOR EVALUATION AND TREATMENT: Rehabilitation  THERAPY DIAG: Dizziness and giddiness  ONSET DATE: 05/20/22  FOLLOW-UP APPT SCHEDULED WITH REFERRING PROVIDER: Yes   FROM INITIAL EVALUATION SUBJECTIVE:   Chief Complaint:  Vertigo  Pertinent History The morning of 05/10/22 around 5:00 pt fell at home. Her daughter and granddaughter went upstairs and helped her back into bed and about 7:45 they heard patient calling because she was dizzy. Shortly after the dizziness started she became confused and per daughter referred to her as her mother instead of knowing that she is her daughter.  Given the persistent dizziness and  confusion EMS was called and patient was taken to the ED. CT scan head and MRI brain did not show any acute findings. Vitals within normal range. Blood glucose 125, BUN 27, rest of CMP within normal range.  CBC Hb 11.9, UA not very impressive, and UDS negative. Due to acute mental status changes and dizziness hospitalist admitted patient for further management. During admission PT was consulted and stated "positive R/L dix Hallpike for BPPV with R sided posterior up beating nystagmus lasting ~30 seconds. L testing positive but no nystagmus noted." Patient's mental status returned to baseline and she was advised to follow-up with OP vestibular PT.  PMH includes multi-level lumbar compression fractures, a-fib, Von Willebrand disease, and HTN.   Description of dizziness: vertigo Frequency: Every morning with position changes; Duration: 5-10 minutes Symptom nature: positional Progression of symptoms since onset: no change History of similar episodes: Yes, several years ago pt had a short bout of vertigo. Daughter in law is a PT instructed daughter in CRT and symptoms resolved.  Provocative Factors: rolling in bed toward both sides; Easing Factors: Meclizine and waiting for symptoms to pass;  Auditory complaints (tinnitus, pain, drainage, hearing loss, aural fullness): No Vision changes (diplopia, visual field loss, recent changes, recent eye exam): No Chest pain/palpitations: No, history of afib but no current complaints. History of head injury/concussion: No, however pt did fall and it is unclear whether she might have struck her head or not; Migraines/headaches: Yes, history of headaches and migraines. No migraines in recent years Nausea/vomiting: Yes, pt  has experienced nausea and vomiting with vertigo Numbness/tingling: No Focal weakness: No Dysarthria/dysphagia/drop attacks: Yes, during initial episode daughter reported some slurring of patient's speech but now resolved.   Has patient fallen  in last 6 months? Yes, Number of falls: 1 Pertinent pain: No, history of multi-level compression fractures in lumbar spine Imaging: Yes, see history Prior level of function: Needs assistance with ADLs and Needs assistance with homemaking  PRECAUTIONS: None  WEIGHT BEARING RESTRICTIONS No  LIVING ENVIRONMENT: Lives with: lives with their family Lives in: House/apartment Stairs: Yes; stair lift to second level; Has following equipment at home: Dan Humphreys - 2 wheeled, shower chair, bed side commode, and Grab bars  PATIENT GOALS: Decrease dizziness in order to improve balance and decrease fall risk   OBJECTIVE EXAMINATION  POSTURE: Severe upper thoracic kyphosis noted;  CRANIAL NERVES II, III, IV, VI: Pupils equal and reactive to light, visual acuity and visual fields are intact, extraocular muscles are intact  VII: Facial strength is intact and symmetric bilaterally  VIII: Pt is very hard of hearing, wears hearing aids;  COORDINATION Deferred   RANGE OF MOTION Cervical Spine AROM is severely limited in extension, moderately limited in rotation bilaterally.   MANUAL MUSCLE TESTING BUE/BLE strength WNL without focal deficits  TRANSFERS/GAIT Independent for transfers and ambulation without assistive device   PATIENT SURVEYS FOTO: 50, predicted improvement to 62 ABC: To be completed DHI: To be completed  OCULOMOTOR / VESTIBULAR TESTING  Oculomotor Exam- Room Light  Findings Comments  Ocular Alignment normal   Ocular ROM abnormal Decreased vertical gaze noted and difficulty with finger follow due to lack of attention;  Spontaneous Nystagmus normal   Gaze-Holding Nystagmus normal   End-Gaze Nystagmus normal   Vergence (normal 2-3") not examined   Smooth Pursuit abnormal Saccadic  Cross-Cover Test not examined   Saccades not examined Pt struggles with understanding commands  VOR Cancellation not examined   Left Head Impulse Unable to tolerate   Right Head Impulse Unable  to tolerate   Static Acuity not examined   Dynamic Acuity not examined    Oculomotor Exam- Fixation Suppressed: Deferred  BPPV TESTS:  Symptoms Duration Intensity Nystagmus  L Dix-Hallpike Dizziness and vomiting >1 minute Severe None  R Dix-Hallpike Dizziness and vomiting >1 minute Severe None  L Head Roll Dizziness and vomiting >1 minute Severe None  R Head Roll Dizziness and vomiting >1 minute Severe None  L Sidelying Test      R Sidelying Test      (blank = not tested)  Clinical Test of Sensory Interaction for Balance (CTSIB): Deferred  06/17/22 UE MMT: MMT (out of 5) Right  Left   Shoulder   Flexion 4+ 4+  Extension    Abduction 4+ 4+  External rotation 5 4+  Internal rotation 5 4+      Elbow  Flexion 4 4  Extension 5 5  Pronation    Supination        Wrist  Flexion 5 5  Extension 5 5  Radial deviation    Ulnar deviation        MCP  Flexion 5 5  Extension 5 5  Abduction 5 5  Adduction 5 5  (* = pain; Blank rows = not tested)  LE MMT: MMT (out of 5) Right  Left   Hip flexion 4+ 4+  Hip extension    Hip abduction (seated) 5 5  Hip adduction (seated) 5 5  Hip internal rotation    Hip  external rotation    Knee flexion 4+ 4+  Knee extension 5 5  Ankle dorsiflexion 4 4  Ankle plantarflexion Strong Strong  (* = pain; Blank rows = not tested)  FUNCTIONAL OUTCOME MEASURES   06/17/22 Comments  BERG 42/56   TUG 18.0s   5TSTS 24.9s   6 Minute Walk Test Deferred   10 Meter Gait Speed Self-selected: 17.8s = 0.56 m/s, fastest: 13.3s = 0.75 m/s   (blank = not tested)   TODAY'S TREATMENT    SUBJECTIVE: Pt reports only one episode of dizziness this week. Daughter would like to practice sit to stands during session as patient has been struggling at home.     PAIN: No pertinent pain;   Neuromuscular Re-education  Alternating 6" step taps wjthout UE support x 10 BLE; Feet together eyes open/closed x 30s each; Feet together eyes open horizontal and  vertical head turns x 30s each; Airex feet apart eyes open/closed x 30s each; Airex feet apart eyes open horizontal and vertical head turns x 30s each;   Ther-ex  NuStep L1-3 x 10 minutes for warm-up and LE strengthening with therapist monitoring and adjusting resistance (5 minutes unbilled); 6" alternating step ups without UE support x 10 leading with each LE; Sit to stand from regular height chair without UE support 2 x 10;  Seated LAQ with 3# ankle weights x 10 BLE; Seated clams with manual resistance from therapist x 10 BLE; Seated adductor squeezes with manual resistance from therapist x 10 BLE;  Standing hip strengthening with 3# ankle weights: Hip flexion marches x 10 BLE; HS curls x 10 BLE; Hip abduction x 10 BLE; Hip extension x 10 BLE;  Standing heel raises with BUE support x 10 BLE; Squats without UE support x 10, cues for proper foot positioning and hip hinge;   PATIENT EDUCATION:  Education details: Pt educated throughout session about proper posture and technique with exercises. Improved exercise technique, movement at target joints, use of target muscles after min to mod verbal, visual, tactile cues. Balance exercises. Person educated: Patient Education method: Explanation Education comprehension: verbalized understanding   HOME EXERCISE PROGRAM:  Continue current home/gym program for strength/balance;   ASSESSMENT: CLINICAL IMPRESSION: Pt demonstrates excellent motivation during session today. Progressed both balance and strength exercises. Pt encouraged to continue home exercise program and follow-up as scheduled. Pt will benefit from PT services to address deficits in strength, balance, dizziness, and mobility in order to return to full function at home and decrease her risk for falls.  OBJECTIVE IMPAIRMENTS: Abnormal gait, decreased balance, decreased mobility, difficulty walking, decreased strength, dizziness, and postural dysfunction.   ACTIVITY  LIMITATIONS: bending, standing, squatting, and bed mobility  PARTICIPATION LIMITATIONS: meal prep, cleaning, laundry, and community activity  PERSONAL FACTORS: Age, Fitness, Past/current experiences, Time since onset of injury/illness/exacerbation, and 3+ comorbidities: compression fractures, a-fib, migraines, HTN, and Von Willebrand disease  are also affecting patient's functional outcome.   REHAB POTENTIAL: Fair    CLINICAL DECISION MAKING: Unstable/unpredictable  EVALUATION COMPLEXITY: High   GOALS:  SHORT TERM GOALS: Target date: 06/23/2022  Pt will be independent with HEP for dizziness in order to decrease symptoms, improve balance,decrease fall risk, and improve function at home. Baseline: Goal status: INITIAL   LONG TERM GOALS: Target date: 07/21/2022  Pt will increase FOTO to at least 62 to demonstrate significant improvement in function at home related to dizziness.  Baseline: 05/26/22: 50 Goal status: INITIAL  2.  Pt will decrease DHI score by at least  18 points in order to demonstrate clinically significant reduction in disability related to dizziness.  Baseline: 05/26/22: To be completed  Goal status: INITIAL  3.  Pt will improve ABC by at least 13% in order to demonstrate clinically significant improvement in balance confidence.      Baseline: 05/26/22: To be completed; 06/17/22: 25.6% Goal status: INITIAL  4. Pt will report 50% improvement in vertigo symptoms in order to decrease fall risk and improve her ability to roll in bed without triggering symptoms.        Baseline:  Goal status: INITIAL   PLAN: PT FREQUENCY: 1-2x/week  PT DURATION: 8 weeks  PLANNED INTERVENTIONS: Therapeutic exercises, Therapeutic activity, Neuromuscular re-education, Balance training, Gait training, Patient/Family education, Self Care, Joint mobilization, Joint manipulation, Vestibular training, Canalith repositioning, Orthotic/Fit training, DME instructions, Dry Needling, Electrical  stimulation, Spinal manipulation, Spinal mobilization, Cryotherapy, Moist heat, Taping, Traction, Ultrasound, Ionotophoresis 4mg /ml Dexamethasone, Manual therapy, and Re-evaluation.  PLAN FOR NEXT SESSION: retest BPPV and perform CRT as indicated, progress balance and strength exercises.   Sharalyn Ink Jamorris Ndiaye PT, DPT, GCS  Bethany Cumming, PT 06/24/2022, 10:27 PM

## 2022-06-30 ENCOUNTER — Ambulatory Visit: Payer: Medicare Other | Attending: Specialist

## 2022-06-30 DIAGNOSIS — R42 Dizziness and giddiness: Secondary | ICD-10-CM | POA: Diagnosis present

## 2022-06-30 NOTE — Therapy (Signed)
OUTPATIENT PHYSICAL THERAPY VESTIBULAR TREATMENT  Patient Name: Tracy Singh MRN: [id number] DOB:1924-12-08, 89 y.o., female Today's Date: 06/30/2022  END OF SESSION:  PT End of Session - 06/30/22 1046     Visit Number 7    Number of Visits 17    Date for PT Re-Evaluation 07/21/22    Authorization Type eval: 05/26/22    PT Start Time 1100    PT Stop Time 1145    PT Time Calculation (min) 45 min    Activity Tolerance Patient tolerated treatment well    Behavior During Therapy [facility] for tasks assessed/performed            Past Medical History:  Diagnosis Date   A-fib (HCC)    Sepsis (HCC) 03/21/2018   Von Willebrand disease (HCC)    Past Surgical History:  Procedure Laterality Date   ABDOMINAL HYSTERECTOMY     Patient Active Problem List   Diagnosis Date Noted   BPPV (benign paroxysmal positional vertigo), unspecified laterality 05/12/2022   Encephalopathy 05/10/2022   Atrial fibrillation with rapid ventricular response (HCC) 05/05/2020   Diarrhea, functional 05/15/2018   Atrial fibrillation, chronic (HCC) 03/10/2015   Essential hypertension 10/06/2013   Von Willebrand disease (HCC) 10/06/2013   PCP: Jenell Milliner, MD  REFERRING PROVIDER: Jenell Milliner, MD   REFERRING DIAG: G93.40 (ICD-10-CM) - Encephalopathy, unspecified  RATIONALE FOR EVALUATION AND TREATMENT: Rehabilitation  THERAPY DIAG: Dizziness and giddiness  ONSET DATE: 05/20/22  FOLLOW-UP APPT SCHEDULED WITH REFERRING PROVIDER: Yes   FROM INITIAL EVALUATION SUBJECTIVE:   Chief Complaint:  Vertigo  Pertinent History The morning of 05/10/22 around 5:00 pt fell at home. Her daughter and granddaughter went upstairs and helped her back into bed and about 7:45 they heard patient calling because she was dizzy. Shortly after the dizziness started she became confused and per daughter referred to her as her mother instead of knowing that she is her daughter.  Given the persistent dizziness and  confusion EMS was called and patient was taken to the ED. CT scan head and MRI brain did not show any acute findings. Vitals within normal range. Blood glucose 125, BUN 27, rest of CMP within normal range.  CBC Hb 11.9, UA not very impressive, and UDS negative. Due to acute mental status changes and dizziness hospitalist admitted patient for further management. During admission PT was consulted and stated "positive R/L dix Hallpike for BPPV with R sided posterior up beating nystagmus lasting ~30 seconds. L testing positive but no nystagmus noted." Patient's mental status returned to baseline and she was advised to follow-up with OP vestibular PT.  PMH includes multi-level lumbar compression fractures, a-fib, Von Willebrand disease, and HTN.   Description of dizziness: vertigo Frequency: Every morning with position changes; Duration: 5-10 minutes Symptom nature: positional Progression of symptoms since onset: no change History of similar episodes: Yes, several years ago pt had a short bout of vertigo. Daughter in law is a PT instructed daughter in CRT and symptoms resolved.  Provocative Factors: rolling in bed toward both sides; Easing Factors: Meclizine and waiting for symptoms to pass;  Auditory complaints (tinnitus, pain, drainage, hearing loss, aural fullness): No Vision changes (diplopia, visual field loss, recent changes, recent eye exam): No Chest pain/palpitations: No, history of afib but no current complaints. History of head injury/concussion: No, however pt did fall and it is unclear whether she might have struck her head or not; Migraines/headaches: Yes, history of headaches and migraines. No migraines in recent years Nausea/vomiting: Yes, pt  has experienced nausea and vomiting with vertigo Numbness/tingling: No Focal weakness: No Dysarthria/dysphagia/drop attacks: Yes, during initial episode daughter reported some slurring of patient's speech but now resolved.   Has patient fallen  in last 6 months? Yes, Number of falls: 1 Pertinent pain: No, history of multi-level compression fractures in lumbar spine Imaging: Yes, see history Prior level of function: Needs assistance with ADLs and Needs assistance with homemaking  PRECAUTIONS: None  WEIGHT BEARING RESTRICTIONS No  LIVING ENVIRONMENT: Lives with: lives with their family Lives in: House/apartment Stairs: Yes; stair lift to second level; Has following equipment at home: Dan Humphreys - 2 wheeled, shower chair, bed side commode, and Grab bars  PATIENT GOALS: Decrease dizziness in order to improve balance and decrease fall risk   OBJECTIVE EXAMINATION  POSTURE: Severe upper thoracic kyphosis noted;  CRANIAL NERVES II, III, IV, VI: Pupils equal and reactive to light, visual acuity and visual fields are intact, extraocular muscles are intact  VII: Facial strength is intact and symmetric bilaterally  VIII: Pt is very hard of hearing, wears hearing aids;  COORDINATION Deferred   RANGE OF MOTION Cervical Spine AROM is severely limited in extension, moderately limited in rotation bilaterally.   MANUAL MUSCLE TESTING BUE/BLE strength WNL without focal deficits  TRANSFERS/GAIT Independent for transfers and ambulation without assistive device   PATIENT SURVEYS FOTO: 50, predicted improvement to 62 ABC: To be completed DHI: To be completed  OCULOMOTOR / VESTIBULAR TESTING  Oculomotor Exam- Room Light  Findings Comments  Ocular Alignment normal   Ocular ROM abnormal Decreased vertical gaze noted and difficulty with finger follow due to lack of attention;  Spontaneous Nystagmus normal   Gaze-Holding Nystagmus normal   End-Gaze Nystagmus normal   Vergence (normal 2-3") not examined   Smooth Pursuit abnormal Saccadic  Cross-Cover Test not examined   Saccades not examined Pt struggles with understanding commands  VOR Cancellation not examined   Left Head Impulse Unable to tolerate   Right Head Impulse Unable  to tolerate   Static Acuity not examined   Dynamic Acuity not examined    Oculomotor Exam- Fixation Suppressed: Deferred  BPPV TESTS:  Symptoms Duration Intensity Nystagmus  L Dix-Hallpike Dizziness and vomiting >1 minute Severe None  R Dix-Hallpike Dizziness and vomiting >1 minute Severe None  L Head Roll Dizziness and vomiting >1 minute Severe None  R Head Roll Dizziness and vomiting >1 minute Severe None  L Sidelying Test      R Sidelying Test      (blank = not tested)  Clinical Test of Sensory Interaction for Balance (CTSIB): Deferred  06/17/22 UE MMT: MMT (out of 5) Right  Left   Shoulder   Flexion 4+ 4+  Extension    Abduction 4+ 4+  External rotation 5 4+  Internal rotation 5 4+      Elbow  Flexion 4 4  Extension 5 5  Pronation    Supination        Wrist  Flexion 5 5  Extension 5 5  Radial deviation    Ulnar deviation        MCP  Flexion 5 5  Extension 5 5  Abduction 5 5  Adduction 5 5  (* = pain; Blank rows = not tested)  LE MMT: MMT (out of 5) Right  Left   Hip flexion 4+ 4+  Hip extension    Hip abduction (seated) 5 5  Hip adduction (seated) 5 5  Hip internal rotation    Hip  external rotation    Knee flexion 4+ 4+  Knee extension 5 5  Ankle dorsiflexion 4 4  Ankle plantarflexion Strong Strong  (* = pain; Blank rows = not tested)  FUNCTIONAL OUTCOME MEASURES   06/17/22 Comments  BERG 42/56   TUG 18.0s   5TSTS 24.9s   6 Minute Walk Test Deferred   10 Meter Gait Speed Self-selected: 17.8s = 0.56 m/s, fastest: 13.3s = 0.75 m/s   (blank = not tested)   TODAY'S TREATMENT    SUBJECTIVE: Pt reports only one episode of dizziness this week and it occurred while she was using the restroom. Daughter reports that pt has done great with her HEP and at with her exercises at the gym. No specific questions currently.   PAIN: No pertinent pain;   Neuromuscular Re-education  Alternating 6" step taps without UE support x 10 BLE; 4 stair  ascend/descend, no UE support during ascend and single RUE support during descend x 2 times; Gait in rehab gym while performing horizontal and vertical head turns x multiple laps; Agility ladder side stepping x multiple bouts each direction; 6" hurdle steps during forward ambulation without UE support in // bars x multiple lengths;   Ther-ex  NuStep L1-3 x 5 minutes for warm-up, LE strengthening, and interval history with therapist monitoring and adjusting resistance; Sit to stand from regular height chair with overhead 4# med ball press 2 x 10;  Seated LAQ with 4# ankle weights x 10 BLE; Seated clams with manual resistance from therapist x 10 BLE; Seated adductor squeezes with manual resistance from therapist x 10 BLE;  Standing hip strengthening with 4# ankle weights: Hip flexion marches x 10 BLE; HS curls x 10 BLE; Hip abduction x 10 BLE; Hip extension x 10 BLE;  Standing heel raises with BUE support x 10 BLE; Squats without UE support x 10, cues for proper foot positioning and hip hinge;   Not performed: 6" alternating step ups without UE support x 10 leading with each LE; Feet together eyes open/closed x 30s each; Feet together eyes open horizontal and vertical head turns x 30s each; Airex feet apart eyes open/closed x 30s each; Airex feet apart eyes open horizontal and vertical head turns x 30s each;    PATIENT EDUCATION:  Education details: Pt educated throughout session about proper posture and technique with exercises. Improved exercise technique, movement at target joints, use of target muscles after min to mod verbal, visual, tactile cues. Balance exercises. Person educated: Patient Education method: Explanation Education comprehension: verbalized understanding   HOME EXERCISE PROGRAM:  Continue current home/gym program for strength/balance;   ASSESSMENT: CLINICAL IMPRESSION: Pt demonstrates excellent motivation during session today. Progressed both balance  and strength exercises. Practiced stairs and she is able to ascend without UE support and mostly CGA but occasional minA. Added medicine ball lifts to her sit to stands. Daughter encouraged to continue challenging her at the gym and with her home exercises. Pt encouraged to follow-up as scheduled. She will benefit from PT services to address deficits in strength, balance, dizziness, and mobility in order to return to full function at home and decrease her risk for falls.  OBJECTIVE IMPAIRMENTS: Abnormal gait, decreased balance, decreased mobility, difficulty walking, decreased strength, dizziness, and postural dysfunction.   ACTIVITY LIMITATIONS: bending, standing, squatting, and bed mobility  PARTICIPATION LIMITATIONS: meal prep, cleaning, laundry, and community activity  PERSONAL FACTORS: Age, Fitness, Past/current experiences, Time since onset of injury/illness/exacerbation, and 3+ comorbidities: compression fractures, a-fib, migraines, HTN, and  Von Willebrand disease  are also affecting patient's functional outcome.   REHAB POTENTIAL: Fair    CLINICAL DECISION MAKING: Unstable/unpredictable  EVALUATION COMPLEXITY: High   GOALS:  SHORT TERM GOALS: Target date: 06/23/2022  Pt will be independent with HEP for dizziness in order to decrease symptoms, improve balance,decrease fall risk, and improve function at home. Baseline: Goal status: INITIAL   LONG TERM GOALS: Target date: 07/21/2022  Pt will increase FOTO to at least 62 to demonstrate significant improvement in function at home related to dizziness.  Baseline: 05/26/22: 50 Goal status: INITIAL  2.  Pt will decrease DHI score by at least 18 points in order to demonstrate clinically significant reduction in disability related to dizziness.  Baseline: 05/26/22: To be completed  Goal status: INITIAL  3.  Pt will improve ABC by at least 13% in order to demonstrate clinically significant improvement in balance confidence.      Baseline:  05/26/22: To be completed; 06/17/22: 25.6% Goal status: INITIAL  4. Pt will report 50% improvement in vertigo symptoms in order to decrease fall risk and improve her ability to roll in bed without triggering symptoms.        Baseline:  Goal status: INITIAL   PLAN: PT FREQUENCY: 1-2x/week  PT DURATION: 8 weeks  PLANNED INTERVENTIONS: Therapeutic exercises, Therapeutic activity, Neuromuscular re-education, Balance training, Gait training, Patient/Family education, Self Care, Joint mobilization, Joint manipulation, Vestibular training, Canalith repositioning, Orthotic/Fit training, DME instructions, Dry Needling, Electrical stimulation, Spinal manipulation, Spinal mobilization, Cryotherapy, Moist heat, Taping, Traction, Ultrasound, Ionotophoresis 4mg /ml Dexamethasone, Manual therapy, and Re-evaluation.  PLAN FOR NEXT SESSION: retest BPPV and perform CRT as indicated, progress balance and strength exercises.   Sharalyn Ink Varshini Arrants PT, DPT, GCS  Alvina Strother, PT 06/30/2022, 1:07 PM

## 2022-07-07 ENCOUNTER — Ambulatory Visit: Payer: Medicare Other

## 2022-07-07 DIAGNOSIS — R42 Dizziness and giddiness: Secondary | ICD-10-CM

## 2022-07-07 NOTE — Therapy (Signed)
OUTPATIENT PHYSICAL THERAPY VESTIBULAR TREATMENT/GOAL UPDATE  Patient Name: Tracy Singh MRN: [id number] DOB:1924/02/17, 89 y.o., female Today's Date: 07/07/2022  END OF SESSION:  PT End of Session - 07/07/22 1055     Visit Number 8    Number of Visits 17    Date for PT Re-Evaluation 07/21/22    Authorization Type eval: 05/26/22    PT Start Time 1100    PT Stop Time 1145    PT Time Calculation (min) 45 min    Activity Tolerance Patient tolerated treatment well    Behavior During Therapy [facility] for tasks assessed/performed            Past Medical History:  Diagnosis Date   A-fib (HCC)    Sepsis (HCC) 03/21/2018   Von Willebrand disease (HCC)    Past Surgical History:  Procedure Laterality Date   ABDOMINAL HYSTERECTOMY     Patient Active Problem List   Diagnosis Date Noted   BPPV (benign paroxysmal positional vertigo), unspecified laterality 05/12/2022   Encephalopathy 05/10/2022   Atrial fibrillation with rapid ventricular response (HCC) 05/05/2020   Diarrhea, functional 05/15/2018   Atrial fibrillation, chronic (HCC) 03/10/2015   Essential hypertension 10/06/2013   Von Willebrand disease (HCC) 10/06/2013   PCP: Jenell Milliner, MD  REFERRING PROVIDER: Jenell Milliner, MD   REFERRING DIAG: G93.40 (ICD-10-CM) - Encephalopathy, unspecified  RATIONALE FOR EVALUATION AND TREATMENT: Rehabilitation  THERAPY DIAG: Dizziness and giddiness  ONSET DATE: 05/20/22  FOLLOW-UP APPT SCHEDULED WITH REFERRING PROVIDER: Yes   FROM INITIAL EVALUATION SUBJECTIVE:   Chief Complaint:  Vertigo  Pertinent History The morning of 05/10/22 around 5:00 pt fell at home. Her daughter and granddaughter went upstairs and helped her back into bed and about 7:45 they heard patient calling because she was dizzy. Shortly after the dizziness started she became confused and per daughter referred to her as her mother instead of knowing that she is her daughter.  Given the persistent  dizziness and confusion EMS was called and patient was taken to the ED. CT scan head and MRI brain did not show any acute findings. Vitals within normal range. Blood glucose 125, BUN 27, rest of CMP within normal range.  CBC Hb 11.9, UA not very impressive, and UDS negative. Due to acute mental status changes and dizziness hospitalist admitted patient for further management. During admission PT was consulted and stated "positive R/L dix Hallpike for BPPV with R sided posterior up beating nystagmus lasting ~30 seconds. L testing positive but no nystagmus noted." Patient's mental status returned to baseline and she was advised to follow-up with OP vestibular PT.  PMH includes multi-level lumbar compression fractures, a-fib, Von Willebrand disease, and HTN.   Description of dizziness: vertigo Frequency: Every morning with position changes; Duration: 5-10 minutes Symptom nature: positional Progression of symptoms since onset: no change History of similar episodes: Yes, several years ago pt had a short bout of vertigo. Daughter in law is a PT instructed daughter in CRT and symptoms resolved.  Provocative Factors: rolling in bed toward both sides; Easing Factors: Meclizine and waiting for symptoms to pass;  Auditory complaints (tinnitus, pain, drainage, hearing loss, aural fullness): No Vision changes (diplopia, visual field loss, recent changes, recent eye exam): No Chest pain/palpitations: No, history of afib but no current complaints. History of head injury/concussion: No, however pt did fall and it is unclear whether she might have struck her head or not; Migraines/headaches: Yes, history of headaches and migraines. No migraines in recent years Nausea/vomiting: Yes,  pt has experienced nausea and vomiting with vertigo Numbness/tingling: No Focal weakness: No Dysarthria/dysphagia/drop attacks: Yes, during initial episode daughter reported some slurring of patient's speech but now resolved.   Has  patient fallen in last 6 months? Yes, Number of falls: 1 Pertinent pain: No, history of multi-level compression fractures in lumbar spine Imaging: Yes, see history Prior level of function: Needs assistance with ADLs and Needs assistance with homemaking  PRECAUTIONS: None  WEIGHT BEARING RESTRICTIONS No  LIVING ENVIRONMENT: Lives with: lives with their family Lives in: House/apartment Stairs: Yes; stair lift to second level; Has following equipment at home: Dan Humphreys - 2 wheeled, shower chair, bed side commode, and Grab bars  PATIENT GOALS: Decrease dizziness in order to improve balance and decrease fall risk   OBJECTIVE EXAMINATION  POSTURE: Severe upper thoracic kyphosis noted;  CRANIAL NERVES II, III, IV, VI: Pupils equal and reactive to light, visual acuity and visual fields are intact, extraocular muscles are intact  VII: Facial strength is intact and symmetric bilaterally  VIII: Pt is very hard of hearing, wears hearing aids;  COORDINATION Deferred   RANGE OF MOTION Cervical Spine AROM is severely limited in extension, moderately limited in rotation bilaterally.   MANUAL MUSCLE TESTING BUE/BLE strength WNL without focal deficits  TRANSFERS/GAIT Independent for transfers and ambulation without assistive device   PATIENT SURVEYS FOTO: 50, predicted improvement to 62 ABC: To be completed DHI: To be completed  OCULOMOTOR / VESTIBULAR TESTING  Oculomotor Exam- Room Light  Findings Comments  Ocular Alignment normal   Ocular ROM abnormal Decreased vertical gaze noted and difficulty with finger follow due to lack of attention;  Spontaneous Nystagmus normal   Gaze-Holding Nystagmus normal   End-Gaze Nystagmus normal   Vergence (normal 2-3") not examined   Smooth Pursuit abnormal Saccadic  Cross-Cover Test not examined   Saccades not examined Pt struggles with understanding commands  VOR Cancellation not examined   Left Head Impulse Unable to tolerate   Right Head  Impulse Unable to tolerate   Static Acuity not examined   Dynamic Acuity not examined    Oculomotor Exam- Fixation Suppressed: Deferred  BPPV TESTS:  Symptoms Duration Intensity Nystagmus  L Dix-Hallpike Dizziness and vomiting >1 minute Severe None  R Dix-Hallpike Dizziness and vomiting >1 minute Severe None  L Head Roll Dizziness and vomiting >1 minute Severe None  R Head Roll Dizziness and vomiting >1 minute Severe None  L Sidelying Test      R Sidelying Test      (blank = not tested)  Clinical Test of Sensory Interaction for Balance (CTSIB): Deferred  06/17/22 UE MMT: MMT (out of 5) Right  Left   Shoulder   Flexion 4+ 4+  Extension    Abduction 4+ 4+  External rotation 5 4+  Internal rotation 5 4+      Elbow  Flexion 4 4  Extension 5 5  Pronation    Supination        Wrist  Flexion 5 5  Extension 5 5  Radial deviation    Ulnar deviation        MCP  Flexion 5 5  Extension 5 5  Abduction 5 5  Adduction 5 5  (* = pain; Blank rows = not tested)  LE MMT: MMT (out of 5) Right  Left   Hip flexion 4+ 4+  Hip extension    Hip abduction (seated) 5 5  Hip adduction (seated) 5 5  Hip internal rotation  Hip external rotation    Knee flexion 4+ 4+  Knee extension 5 5  Ankle dorsiflexion 4 4  Ankle plantarflexion Strong Strong  (* = pain; Blank rows = not tested)  FUNCTIONAL OUTCOME MEASURES   06/17/22 Comments  BERG 42/56   TUG 18.0s   5TSTS 24.9s   6 Minute Walk Test Deferred   10 Meter Gait Speed Self-selected: 17.8s = 0.56 m/s, fastest: 13.3s = 0.75 m/s   (blank = not tested)   TODAY'S TREATMENT    SUBJECTIVE: Pt and daughter state that she had a good week. Pt denies any episodes of dizziness. No specific questions currently.   PAIN: No pain;   Neuromuscular Re-education   FUNCTIONAL OUTCOME MEASURES   06/17/22 07/07/22 Comments  BERG 42/56 46/56   TUG 18.0s 17.0s   5TSTS 24.9s 16.5s   6 Minute Walk Test Deferred Deferred   10 Meter  Gait Speed Self-selected: 17.8s = 0.56 m/s, fastest: 13.3s = 0.75 m/s Self-selected: 19.1s = 0.52 m/s, fastest: 14.0s = 0.71 m/s    Alternating 12" step taps without UE support x 10 BLE; Airex feet apart eyes open/closed x 30s each; Airex feet apart eyes open horizontal and vertical head turns x 30s each;   Ther-ex  NuStep L1-4 x 8 minutes for warm-up, LE strengthening, and interval history with therapist monitoring and adjusting resistance; Mini squats x 10; Seated clams with manual resistance from therapist 2 x 10 BLE; Seated adductor squeezes with manual resistance from therapist 2 x 10 BLE; Seated LAQ with 5# ankle weights 2 x 10 BLE; Standing heel raises with BUE support x 10 BLE; Side stepping with 5# AW in // bars with BUE support x multiple lengths;  Standing hip strengthening with 5# ankle weights: Hip flexion marches x 10 BLE; HS curls x 10 BLE; Hip abduction x 10 BLE; Hip extension x 10 BLE;   Not performed: 6" alternating step ups without UE support x 10 leading with each LE; Feet together eyes open/closed x 30s each; Feet together eyes open horizontal and vertical head turns x 30s each; Gait in rehab gym while performing horizontal and vertical head turns x multiple laps; Agility ladder side stepping x multiple bouts each direction; 6" hurdle steps during forward ambulation without UE support in // bars x multiple lengths; 4 stair ascend/descend, no UE support during ascend and single RUE support during descend x 2 times; Sit to stand from regular height chair with overhead 4# med ball press 2 x 10;    PATIENT EDUCATION:  Education details: Pt educated throughout session about proper posture and technique with exercises. Improved exercise technique, movement at target joints, use of target muscles after min to mod verbal, visual, tactile cues. Balance exercises. Person educated: Patient Education method: Explanation Education comprehension: verbalized  understanding   HOME EXERCISE PROGRAM:  Continue current home/gym program for strength/balance;   ASSESSMENT: CLINICAL IMPRESSION: Updated outcome measures with patient during visit today.  Her BERG balance scale improved from 42/56 to 46/56.  Her TUG improved from 18.0 seconds to 17.0 seconds and her 5 Times Sit to Stand improved from 24.9 seconds to 16.5 seconds. No significant improvement noted in 57m gait speed. Pt demonstrates excellent motivation during session today. Progressed both balance and strength exercises.  Overall patient has made excellent progress toward her goals.  Encouraged patient to continue her home exercise program and to follow-up as scheduled. She will benefit from PT services to address deficits in strength, balance, dizziness, and mobility  in order to return to full function at home and decrease her risk for falls.  OBJECTIVE IMPAIRMENTS: Abnormal gait, decreased balance, decreased mobility, difficulty walking, decreased strength, dizziness, and postural dysfunction.   ACTIVITY LIMITATIONS: bending, standing, squatting, and bed mobility  PARTICIPATION LIMITATIONS: meal prep, cleaning, laundry, and community activity  PERSONAL FACTORS: Age, Fitness, Past/current experiences, Time since onset of injury/illness/exacerbation, and 3+ comorbidities: compression fractures, a-fib, migraines, HTN, and Von Willebrand disease  are also affecting patient's functional outcome.   REHAB POTENTIAL: Fair    CLINICAL DECISION MAKING: Unstable/unpredictable  EVALUATION COMPLEXITY: High   GOALS:  SHORT TERM GOALS: Target date: 06/23/2022  Pt will be independent with HEP for dizziness in order to decrease symptoms, improve balance,decrease fall risk, and improve function at home. Baseline: Goal status: INITIAL   LONG TERM GOALS: Target date: 07/21/2022  Pt will increase FOTO to at least 62 to demonstrate significant improvement in function at home related to dizziness.   Baseline: 05/26/22: 50 Goal status: INITIAL  2.  Pt will decrease DHI score by at least 18 points in order to demonstrate clinically significant reduction in disability related to dizziness.  Baseline: 05/26/22: To be completed  Goal status: DISCONTINUED  3.  Pt will improve ABC by at least 13% in order to demonstrate clinically significant improvement in balance confidence.      Baseline: 05/26/22: To be completed; 06/17/22: 25.6% Goal status: IN PROGRESS  4. Pt will report 50% improvement in vertigo symptoms in order to decrease fall risk and improve her ability to roll in bed without triggering symptoms.        Baseline: 07/07/22: No episodes of vertigo in the last week Goal status: MET  5. Pt will decrease 5TSTS to below 14 seconds in order to demonstrate clinically significant improvement in LE strength       Baseline: 06/17/22: 24.9, 07/07/22: 16.5s Goal status: PARTIALLY MET  6. Pt will improve BERG to at least 49/56 points in order to demonstrate clinically significant improvement in balance.      Baseline: 06/17/22: 42/56, 07/07/22: 46/56; Goal status: PARTIALLY MET  PLAN: PT FREQUENCY: 1-2x/week  PT DURATION: 8 weeks  PLANNED INTERVENTIONS: Therapeutic exercises, Therapeutic activity, Neuromuscular re-education, Balance training, Gait training, Patient/Family education, Self Care, Joint mobilization, Joint manipulation, Vestibular training, Canalith repositioning, Orthotic/Fit training, DME instructions, Dry Needling, Electrical stimulation, Spinal manipulation, Spinal mobilization, Cryotherapy, Moist heat, Taping, Traction, Ultrasound, Ionotophoresis 4mg /ml Dexamethasone, Manual therapy, and Re-evaluation.  PLAN FOR NEXT SESSION: retest BPPV and perform CRT as indicated, progress balance and strength exercises.   Sharalyn Ink Odis Turck PT, DPT, GCS  Yamina Lenis, PT 07/07/2022, 12:50 PM

## 2022-07-13 NOTE — Therapy (Signed)
OUTPATIENT PHYSICAL THERAPY VESTIBULAR TREATMENT/GOAL UPDATE  Patient Name: Tracy Singh MRN: [id number] DOB:03/11/1924, 89 y.o., female Today's Date: 07/13/2022  END OF SESSION:   Past Medical History:  Diagnosis Date   A-fib (HCC)    Sepsis (HCC) 03/21/2018   Von Willebrand disease (HCC)    Past Surgical History:  Procedure Laterality Date   ABDOMINAL HYSTERECTOMY     Patient Active Problem List   Diagnosis Date Noted   BPPV (benign paroxysmal positional vertigo), unspecified laterality 05/12/2022   Encephalopathy 05/10/2022   Atrial fibrillation with rapid ventricular response (HCC) 05/05/2020   Diarrhea, functional 05/15/2018   Atrial fibrillation, chronic (HCC) 03/10/2015   Essential hypertension 10/06/2013   Von Willebrand disease (HCC) 10/06/2013   PCP: Jenell Milliner, MD  REFERRING PROVIDER: Jenell Milliner, MD   REFERRING DIAG: G93.40 (ICD-10-CM) - Encephalopathy, unspecified  RATIONALE FOR EVALUATION AND TREATMENT: Rehabilitation  THERAPY DIAG: Dizziness and giddiness  ONSET DATE: 05/20/22  FOLLOW-UP APPT SCHEDULED WITH REFERRING PROVIDER: Yes   FROM INITIAL EVALUATION SUBJECTIVE:   Chief Complaint:  Vertigo  Pertinent History The morning of 05/10/22 around 5:00 pt fell at home. Her daughter and granddaughter went upstairs and helped her back into bed and about 7:45 they heard patient calling because she was dizzy. Shortly after the dizziness started she became confused and per daughter referred to her as her mother instead of knowing that she is her daughter.  Given the persistent dizziness and confusion EMS was called and patient was taken to the ED. CT scan head and MRI brain did not show any acute findings. Vitals within normal range. Blood glucose 125, BUN 27, rest of CMP within normal range.  CBC Hb 11.9, UA not very impressive, and UDS negative. Due to acute mental status changes and dizziness hospitalist admitted patient for further  management. During admission PT was consulted and stated "positive R/L dix Hallpike for BPPV with R sided posterior up beating nystagmus lasting ~30 seconds. L testing positive but no nystagmus noted." Patient's mental status returned to baseline and she was advised to follow-up with OP vestibular PT.  PMH includes multi-level lumbar compression fractures, a-fib, Von Willebrand disease, and HTN.   Description of dizziness: vertigo Frequency: Every morning with position changes; Duration: 5-10 minutes Symptom nature: positional Progression of symptoms since onset: no change History of similar episodes: Yes, several years ago pt had a short bout of vertigo. Daughter in law is a PT instructed daughter in CRT and symptoms resolved.  Provocative Factors: rolling in bed toward both sides; Easing Factors: Meclizine and waiting for symptoms to pass;  Auditory complaints (tinnitus, pain, drainage, hearing loss, aural fullness): No Vision changes (diplopia, visual field loss, recent changes, recent eye exam): No Chest pain/palpitations: No, history of afib but no current complaints. History of head injury/concussion: No, however pt did fall and it is unclear whether she might have struck her head or not; Migraines/headaches: Yes, history of headaches and migraines. No migraines in recent years Nausea/vomiting: Yes, pt has experienced nausea and vomiting with vertigo Numbness/tingling: No Focal weakness: No Dysarthria/dysphagia/drop attacks: Yes, during initial episode daughter reported some slurring of patient's speech but now resolved.   Has patient fallen in last 6 months? Yes, Number of falls: 1 Pertinent pain: No, history of multi-level compression fractures in lumbar spine Imaging: Yes, see history Prior level of function: Needs assistance with ADLs and Needs assistance with homemaking  PRECAUTIONS: None  WEIGHT BEARING RESTRICTIONS No  LIVING ENVIRONMENT: Lives with: lives with their  family Lives in: House/apartment Stairs: Yes; stair lift to second level; Has following equipment at home: Dan Humphreys - 2 wheeled, shower chair, bed side commode, and Grab bars  PATIENT GOALS: Decrease dizziness in order to improve balance and decrease fall risk   OBJECTIVE EXAMINATION  POSTURE: Severe upper thoracic kyphosis noted;  CRANIAL NERVES II, III, IV, VI: Pupils equal and reactive to light, visual acuity and visual fields are intact, extraocular muscles are intact  VII: Facial strength is intact and symmetric bilaterally  VIII: Pt is very hard of hearing, wears hearing aids;  COORDINATION Deferred   RANGE OF MOTION Cervical Spine AROM is severely limited in extension, moderately limited in rotation bilaterally.   MANUAL MUSCLE TESTING BUE/BLE strength WNL without focal deficits  TRANSFERS/GAIT Independent for transfers and ambulation without assistive device   PATIENT SURVEYS FOTO: 50, predicted improvement to 62 ABC: To be completed DHI: To be completed  OCULOMOTOR / VESTIBULAR TESTING  Oculomotor Exam- Room Light  Findings Comments  Ocular Alignment normal   Ocular ROM abnormal Decreased vertical gaze noted and difficulty with finger follow due to lack of attention;  Spontaneous Nystagmus normal   Gaze-Holding Nystagmus normal   End-Gaze Nystagmus normal   Vergence (normal 2-3") not examined   Smooth Pursuit abnormal Saccadic  Cross-Cover Test not examined   Saccades not examined Pt struggles with understanding commands  VOR Cancellation not examined   Left Head Impulse Unable to tolerate   Right Head Impulse Unable to tolerate   Static Acuity not examined   Dynamic Acuity not examined    Oculomotor Exam- Fixation Suppressed: Deferred  BPPV TESTS:  Symptoms Duration Intensity Nystagmus  L Dix-Hallpike Dizziness and vomiting >1 minute Severe None  R Dix-Hallpike Dizziness and vomiting >1 minute Severe None  L Head Roll Dizziness and vomiting >1  minute Severe None  R Head Roll Dizziness and vomiting >1 minute Severe None  L Sidelying Test      R Sidelying Test      (blank = not tested)  Clinical Test of Sensory Interaction for Balance (CTSIB): Deferred  06/17/22 UE MMT: MMT (out of 5) Right  Left   Shoulder   Flexion 4+ 4+  Extension    Abduction 4+ 4+  External rotation 5 4+  Internal rotation 5 4+      Elbow  Flexion 4 4  Extension 5 5  Pronation    Supination        Wrist  Flexion 5 5  Extension 5 5  Radial deviation    Ulnar deviation        MCP  Flexion 5 5  Extension 5 5  Abduction 5 5  Adduction 5 5  (* = pain; Blank rows = not tested)  LE MMT: MMT (out of 5) Right  Left   Hip flexion 4+ 4+  Hip extension    Hip abduction (seated) 5 5  Hip adduction (seated) 5 5  Hip internal rotation    Hip external rotation    Knee flexion 4+ 4+  Knee extension 5 5  Ankle dorsiflexion 4 4  Ankle plantarflexion Strong Strong  (* = pain; Blank rows = not tested)  FUNCTIONAL OUTCOME MEASURES   06/17/22 Comments  BERG 42/56   TUG 18.0s   5TSTS 24.9s   6 Minute Walk Test Deferred   10 Meter Gait Speed Self-selected: 17.8s = 0.56 m/s, fastest: 13.3s = 0.75 m/s   (blank = not tested)   TODAY'S TREATMENT  SUBJECTIVE: Pt and daughter state that she had a good week. Pt denies any episodes of dizziness. No specific questions currently.   PAIN: No pain;   Neuromuscular Re-education   FUNCTIONAL OUTCOME MEASURES   06/17/22 07/07/22 Comments  BERG 42/56 46/56   TUG 18.0s 17.0s   5TSTS 24.9s 16.5s   6 Minute Walk Test Deferred Deferred   10 Meter Gait Speed Self-selected: 17.8s = 0.56 m/s, fastest: 13.3s = 0.75 m/s Self-selected: 19.1s = 0.52 m/s, fastest: 14.0s = 0.71 m/s    Alternating 12" step taps without UE support x 10 BLE; Airex feet apart eyes open/closed x 30s each; Airex feet apart eyes open horizontal and vertical head turns x 30s each;   Ther-ex  NuStep L1-4 x 8 minutes for  warm-up, LE strengthening, and interval history with therapist monitoring and adjusting resistance; Mini squats x 10; Seated clams with manual resistance from therapist 2 x 10 BLE; Seated adductor squeezes with manual resistance from therapist 2 x 10 BLE; Seated LAQ with 5# ankle weights 2 x 10 BLE; Standing heel raises with BUE support x 10 BLE; Side stepping with 5# AW in // bars with BUE support x multiple lengths;  Standing hip strengthening with 5# ankle weights: Hip flexion marches x 10 BLE; HS curls x 10 BLE; Hip abduction x 10 BLE; Hip extension x 10 BLE;   Not performed: 6" alternating step ups without UE support x 10 leading with each LE; Feet together eyes open/closed x 30s each; Feet together eyes open horizontal and vertical head turns x 30s each; Gait in rehab gym while performing horizontal and vertical head turns x multiple laps; Agility ladder side stepping x multiple bouts each direction; 6" hurdle steps during forward ambulation without UE support in // bars x multiple lengths; 4 stair ascend/descend, no UE support during ascend and single RUE support during descend x 2 times; Sit to stand from regular height chair with overhead 4# med ball press 2 x 10;    PATIENT EDUCATION:  Education details: Pt educated throughout session about proper posture and technique with exercises. Improved exercise technique, movement at target joints, use of target muscles after min to mod verbal, visual, tactile cues. Balance exercises. Person educated: Patient Education method: Explanation Education comprehension: verbalized understanding   HOME EXERCISE PROGRAM:  Continue current home/gym program for strength/balance;   ASSESSMENT: CLINICAL IMPRESSION: Updated outcome measures with patient during visit today.  Her BERG balance scale improved from 42/56 to 46/56.  Her TUG improved from 18.0 seconds to 17.0 seconds and her 5 Times Sit to Stand improved from 24.9 seconds to 16.5  seconds. No significant improvement noted in 43m gait speed. Pt demonstrates excellent motivation during session today. Progressed both balance and strength exercises.  Overall patient has made excellent progress toward her goals.  Encouraged patient to continue her home exercise program and to follow-up as scheduled. She will benefit from PT services to address deficits in strength, balance, dizziness, and mobility in order to return to full function at home and decrease her risk for falls.  OBJECTIVE IMPAIRMENTS: Abnormal gait, decreased balance, decreased mobility, difficulty walking, decreased strength, dizziness, and postural dysfunction.   ACTIVITY LIMITATIONS: bending, standing, squatting, and bed mobility  PARTICIPATION LIMITATIONS: meal prep, cleaning, laundry, and community activity  PERSONAL FACTORS: Age, Fitness, Past/current experiences, Time since onset of injury/illness/exacerbation, and 3+ comorbidities: compression fractures, a-fib, migraines, HTN, and Von Willebrand disease  are also affecting patient's functional outcome.   REHAB POTENTIAL: Fair  CLINICAL DECISION MAKING: Unstable/unpredictable  EVALUATION COMPLEXITY: High   GOALS:  SHORT TERM GOALS: Target date: 06/23/2022  Pt will be independent with HEP for dizziness in order to decrease symptoms, improve balance,decrease fall risk, and improve function at home. Baseline: Goal status: INITIAL   LONG TERM GOALS: Target date: 07/21/2022  Pt will increase FOTO to at least 62 to demonstrate significant improvement in function at home related to dizziness.  Baseline: 05/26/22: 50 Goal status: INITIAL  2.  Pt will decrease DHI score by at least 18 points in order to demonstrate clinically significant reduction in disability related to dizziness.  Baseline: 05/26/22: To be completed  Goal status: DISCONTINUED  3.  Pt will improve ABC by at least 13% in order to demonstrate clinically significant improvement in balance  confidence.      Baseline: 05/26/22: To be completed; 06/17/22: 25.6% Goal status: IN PROGRESS  4. Pt will report 50% improvement in vertigo symptoms in order to decrease fall risk and improve her ability to roll in bed without triggering symptoms.        Baseline: 07/07/22: No episodes of vertigo in the last week Goal status: MET  5. Pt will decrease 5TSTS to below 14 seconds in order to demonstrate clinically significant improvement in LE strength       Baseline: 06/17/22: 24.9, 07/07/22: 16.5s Goal status: PARTIALLY MET  6. Pt will improve BERG to at least 49/56 points in order to demonstrate clinically significant improvement in balance.      Baseline: 06/17/22: 42/56, 07/07/22: 46/56; Goal status: PARTIALLY MET  PLAN: PT FREQUENCY: 1-2x/week  PT DURATION: 8 weeks  PLANNED INTERVENTIONS: Therapeutic exercises, Therapeutic activity, Neuromuscular re-education, Balance training, Gait training, Patient/Family education, Self Care, Joint mobilization, Joint manipulation, Vestibular training, Canalith repositioning, Orthotic/Fit training, DME instructions, Dry Needling, Electrical stimulation, Spinal manipulation, Spinal mobilization, Cryotherapy, Moist heat, Taping, Traction, Ultrasound, Ionotophoresis 4mg /ml Dexamethasone, Manual therapy, and Re-evaluation.  PLAN FOR NEXT SESSION: retest BPPV and perform CRT as indicated, progress balance and strength exercises.   Sharalyn Ink Chrisean Kloth PT, DPT, [facility]  Jovontae Banko, PT 07/13/2022, 10:25 AM

## 2022-07-14 ENCOUNTER — Ambulatory Visit: Payer: Medicare Other

## 2022-07-14 DIAGNOSIS — R42 Dizziness and giddiness: Secondary | ICD-10-CM

## 2022-07-29 ENCOUNTER — Ambulatory Visit: Payer: Medicare Other | Attending: Specialist

## 2022-07-29 DIAGNOSIS — R42 Dizziness and giddiness: Secondary | ICD-10-CM | POA: Insufficient documentation

## 2022-07-29 NOTE — Therapy (Signed)
OUTPATIENT PHYSICAL THERAPY VESTIBULAR TREATMENT/RECERTIFICATION/PROGRESS NOTE  Dates of reporting period  05/26/22   to   07/29/22   Patient Name: Tracy Singh MRN: [id number] DOB:06-Aug-1924, 89 y.o., female Today's Date: 08/02/2022  END OF SESSION:  PT End of Session - 08/02/22 0844     Visit Number 10    Number of Visits 33    Date for PT Re-Evaluation 09/27/22    Authorization Type eval: 05/26/22, recertification: 08/02/22;    PT Start Time 0845    PT Stop Time 0930    PT Time Calculation (min) 45 min    Equipment Utilized During Treatment Gait belt    Activity Tolerance Patient tolerated treatment well    Behavior During Therapy WFL for tasks assessed/performed             Past Medical History:  Diagnosis Date   A-fib (HCC)    Sepsis (HCC) 03/21/2018   Von Willebrand disease (HCC)    Past Surgical History:  Procedure Laterality Date   ABDOMINAL HYSTERECTOMY     Patient Active Problem List   Diagnosis Date Noted   BPPV (benign paroxysmal positional vertigo), unspecified laterality 05/12/2022   Encephalopathy 05/10/2022   Atrial fibrillation with rapid ventricular response (HCC) 05/05/2020   Diarrhea, functional 05/15/2018   Atrial fibrillation, chronic (HCC) 03/10/2015   Essential hypertension 10/06/2013   Von Willebrand disease (HCC) 10/06/2013   PCP: Jenell Milliner, MD  REFERRING PROVIDER: Jenell Milliner, MD   REFERRING DIAG: G93.40 (ICD-10-CM) - Encephalopathy, unspecified  RATIONALE FOR EVALUATION AND TREATMENT: Rehabilitation  THERAPY DIAG: Dizziness and giddiness  ONSET DATE: 05/20/22  FOLLOW-UP APPT SCHEDULED WITH REFERRING PROVIDER: Yes   FROM INITIAL EVALUATION SUBJECTIVE:   Chief Complaint:  Vertigo  Pertinent History The morning of 05/10/22 around 5:00 pt fell at home. Her daughter and granddaughter went upstairs and helped her back into bed and about 7:45 they heard patient calling because she was dizzy. Shortly after the dizziness  started she became confused and per daughter referred to her as her mother instead of knowing that she is her daughter.  Given the persistent dizziness and confusion EMS was called and patient was taken to the ED. CT scan head and MRI brain did not show any acute findings. Vitals within normal range. Blood glucose 125, BUN 27, rest of CMP within normal range.  CBC Hb 11.9, UA not very impressive, and UDS negative. Due to acute mental status changes and dizziness hospitalist admitted patient for further management. During admission PT was consulted and stated "positive R/L dix Hallpike for BPPV with R sided posterior up beating nystagmus lasting ~30 seconds. L testing positive but no nystagmus noted." Patient's mental status returned to baseline and she was advised to follow-up with OP vestibular PT.  PMH includes multi-level lumbar compression fractures, a-fib, Von Willebrand disease, and HTN.   Description of dizziness: vertigo Frequency: Every morning with position changes; Duration: 5-10 minutes Symptom nature: positional Progression of symptoms since onset: no change History of similar episodes: Yes, several years ago pt had a short bout of vertigo. Daughter in law is a PT instructed daughter in CRT and symptoms resolved.  Provocative Factors: rolling in bed toward both sides; Easing Factors: Meclizine and waiting for symptoms to pass;  Auditory complaints (tinnitus, pain, drainage, hearing loss, aural fullness): No Vision changes (diplopia, visual field loss, recent changes, recent eye exam): No Chest pain/palpitations: No, history of afib but no current complaints. History of head injury/concussion: No, however pt did fall and  it is unclear whether she might have struck her head or not; Migraines/headaches: Yes, history of headaches and migraines. No migraines in recent years Nausea/vomiting: Yes, pt has experienced nausea and vomiting with vertigo Numbness/tingling: No Focal weakness:  No Dysarthria/dysphagia/drop attacks: Yes, during initial episode daughter reported some slurring of patient's speech but now resolved.   Has patient fallen in last 6 months? Yes, Number of falls: 1 Pertinent pain: No, history of multi-level compression fractures in lumbar spine Imaging: Yes, see history Prior level of function: Needs assistance with ADLs and Needs assistance with homemaking  PRECAUTIONS: None  WEIGHT BEARING RESTRICTIONS No  LIVING ENVIRONMENT: Lives with: lives with their family Lives in: House/apartment Stairs: Yes; stair lift to second level; Has following equipment at home: Dan Humphreys - 2 wheeled, shower chair, bed side commode, and Grab bars  PATIENT GOALS: Decrease dizziness in order to improve balance and decrease fall risk   OBJECTIVE EXAMINATION  POSTURE: Severe upper thoracic kyphosis noted;  CRANIAL NERVES II, III, IV, VI: Pupils equal and reactive to light, visual acuity and visual fields are intact, extraocular muscles are intact  VII: Facial strength is intact and symmetric bilaterally  VIII: Pt is very hard of hearing, wears hearing aids;  COORDINATION Deferred   RANGE OF MOTION Cervical Spine AROM is severely limited in extension, moderately limited in rotation bilaterally.   MANUAL MUSCLE TESTING BUE/BLE strength WNL without focal deficits  TRANSFERS/GAIT Independent for transfers and ambulation without assistive device   PATIENT SURVEYS FOTO: 50, predicted improvement to 62 ABC: To be completed DHI: To be completed  OCULOMOTOR / VESTIBULAR TESTING  Oculomotor Exam- Room Light  Findings Comments  Ocular Alignment normal   Ocular ROM abnormal Decreased vertical gaze noted and difficulty with finger follow due to lack of attention;  Spontaneous Nystagmus normal   Gaze-Holding Nystagmus normal   End-Gaze Nystagmus normal   Vergence (normal 2-3") not examined   Smooth Pursuit abnormal Saccadic  Cross-Cover Test not examined    Saccades not examined Pt struggles with understanding commands  VOR Cancellation not examined   Left Head Impulse Unable to tolerate   Right Head Impulse Unable to tolerate   Static Acuity not examined   Dynamic Acuity not examined    Oculomotor Exam- Fixation Suppressed: Deferred  BPPV TESTS:  Symptoms Duration Intensity Nystagmus  L Dix-Hallpike Dizziness and vomiting >1 minute Severe None  R Dix-Hallpike Dizziness and vomiting >1 minute Severe None  L Head Roll Dizziness and vomiting >1 minute Severe None  R Head Roll Dizziness and vomiting >1 minute Severe None  L Sidelying Test      R Sidelying Test      (blank = not tested)  Clinical Test of Sensory Interaction for Balance (CTSIB): Deferred  06/17/22 UE MMT: MMT (out of 5) Right  Left   Shoulder   Flexion 4+ 4+  Extension    Abduction 4+ 4+  External rotation 5 4+  Internal rotation 5 4+      Elbow  Flexion 4 4  Extension 5 5  Pronation    Supination        Wrist  Flexion 5 5  Extension 5 5  Radial deviation    Ulnar deviation        MCP  Flexion 5 5  Extension 5 5  Abduction 5 5  Adduction 5 5  (* = pain; Blank rows = not tested)  LE MMT: MMT (out of 5) Right  Left   Hip flexion  4+ 4+  Hip extension    Hip abduction (seated) 5 5  Hip adduction (seated) 5 5  Hip internal rotation    Hip external rotation    Knee flexion 4+ 4+  Knee extension 5 5  Ankle dorsiflexion 4 4  Ankle plantarflexion Strong Strong  (* = pain; Blank rows = not tested)  FUNCTIONAL OUTCOME MEASURES   06/17/22 07/07/22 Comments  BERG 42/56 46/56 Below cut-off  TUG 18.0s 17.0s Above cut-off  5TSTS 24.9s 16.5s WNL  6 Minute Walk Test Deferred Deferred   10 Meter Gait Speed Self-selected: 17.8s = 0.56 m/s, fastest: 13.3s = 0.75 m/s Self-selected: 19.1s = 0.52 m/s, fastest: 14.0s = 0.71 m/s Below full community ambulation speed  (blank = not tested)   TODAY'S TREATMENT    SUBJECTIVE: Pt states that she is doing well  today. Daughter states that pt had one day where she was "back to normal" however continues to report some intermittent confusion. Pt reports 2 bouts of dizziness since the last therapy session. No specific questions currently.   PAIN: No pain;   Neuromuscular Re-education  Updated outcome measures with patient:  FUNCTIONAL OUTCOME MEASURES   06/17/22 07/07/22 07/29/22 Comments  BERG 42/56 46/56 49/56    TUG 18.0s 17.0s 13.4s   5TSTS 24.9s 16.5s 11.5s   6 Minute Walk Test Deferred Deferred 77' with CGA and front wheeled walker    10 Meter Gait Speed Self-selected: 17.8s = 0.56 m/s, fastest: 13.3s = 0.75 m/s Self-selected: 19.1s = 0.52 m/s, fastest: 14.0s = 0.71 m/s Self-selected: 14.5s = 0.69 m/s, fastest: fastest: 10.5s = 0.95 m/s    : Pre vitals: 127/48 mmHg, HR: 90, SpO2: 98%; Post vitals: 153/57 mmHg, HR: 106, SpO2: 98%, no DOE, mild fatigue; Distance: 59' with CGA and front wheeled walker   Education with daughter regarding progress toward goals;   PATIENT EDUCATION:  Education details: Pt educated throughout session about proper posture and technique with exercises. Improved exercise technique, movement at target joints, use of target muscles after min to mod verbal, visual, tactile cues. Outcome measures and goals Person educated: Patient and daughter Education method: Explanation Education comprehension: verbalized understanding   HOME EXERCISE PROGRAM:  Continue current home/gym program for strength/balance;   ASSESSMENT:  CLINICAL IMPRESSION: Updated outcome measures with patient during visit today.  Her BERG balance scale improved from 42/56 at the initial evaluation to 49/56 today.  Her TUG improved from 18.0 seconds to 13.4 seconds and her 5 Times Sit to Stand improved from 24.9 seconds to 11.5 seconds. Her self-selected and fastest 28m gait speeds both improved as well. She ambulated 709' during the 6 Minute Walk Test with CGA and front wheeled walker. No  normative values >90 yo however this appears to be a functional limited community distance for patient. Overall patient has made excellent progress toward her goals. She reports 2 episodes of vertigo since the last therapy session over two weeks ago and therapist will repeat BPPV testing and CRT at next visit. If clear will plan to review HEP and discharge. Encouraged patient to continue her home exercise program and to follow-up as scheduled. She will benefit from PT services to address deficits in strength, balance, dizziness, and mobility in order to return to full function at home and decrease her risk for falls.  OBJECTIVE IMPAIRMENTS: Abnormal gait, decreased balance, decreased mobility, difficulty walking, decreased strength, dizziness, and postural dysfunction.   ACTIVITY LIMITATIONS: bending, standing, squatting, and bed mobility  PARTICIPATION LIMITATIONS: meal prep, cleaning, laundry,  and community activity  PERSONAL FACTORS: Age, Fitness, Past/current experiences, Time since onset of injury/illness/exacerbation, and 3+ comorbidities: compression fractures, a-fib, migraines, HTN, and Von Willebrand disease  are also affecting patient's functional outcome.   REHAB POTENTIAL: Fair    CLINICAL DECISION MAKING: Unstable/unpredictable  EVALUATION COMPLEXITY: High   GOALS:  SHORT TERM GOALS:   Pt will be independent with HEP for dizziness in order to decrease symptoms, improve balance,decrease fall risk, and improve function at home. Baseline: Goal status: ACHIEVED   LONG TERM GOALS: Target date: 09/27/2022   Pt will increase FOTO to at least 62 to demonstrate significant improvement in function at home related to dizziness.  Baseline: 05/26/22: 50; 08/02/22: Deferred Goal status: INITIAL  2.  Pt will decrease DHI score by at least 18 points in order to demonstrate clinically significant reduction in disability related to dizziness.  Baseline: 05/26/22: To be completed  Goal status:  DISCONTINUED  3.  Pt will improve ABC by at least 13% in order to demonstrate clinically significant improvement in balance confidence.      Baseline: 05/26/22: To be completed; 06/17/22: 25.6%; 08/02/22: Deferred Goal status: IN PROGRESS  4. Pt will report 50% improvement in vertigo symptoms in order to decrease fall risk and improve her ability to roll in bed without triggering symptoms.        Baseline: 07/07/22: No episodes of vertigo in the last week; 08/02/22: 2 episodes over the last 2 weeks Goal status: IN PROGRESS  5. Pt will decrease 5TSTS to below 14 seconds in order to demonstrate clinically significant improvement in LE strength       Baseline: 06/17/22: 24.9, 07/07/22: 16.5s; 08/02/22: 11.5s Goal status: ACHIEVED  6. Pt will improve BERG to at least 49/56 points in order to demonstrate clinically significant improvement in balance.      Baseline: 06/17/22: 42/56, 07/07/22: 46/56; 08/02/22: 49/56; Goal status: ACHIEVED  PLAN: PT FREQUENCY: 1-2x/week  PT DURATION: 8 weeks  PLANNED INTERVENTIONS: Therapeutic exercises, Therapeutic activity, Neuromuscular re-education, Balance training, Gait training, Patient/Family education, Self Care, Joint mobilization, Joint manipulation, Vestibular training, Canalith repositioning, Orthotic/Fit training, DME instructions, Dry Needling, Electrical stimulation, Spinal manipulation, Spinal mobilization, Cryotherapy, Moist heat, Taping, Traction, Ultrasound, Ionotophoresis 4mg /ml Dexamethasone, Manual therapy, and Re-evaluation.  PLAN FOR NEXT SESSION: retest BPPV and perform CRT as indicated, review HEP and discharge at one of the upcoming appointments  Lynnea Maizes PT, DPT, [facility]  Emrey Thornley, PT 08/02/2022, 12:47 PM

## 2022-07-30 IMAGING — CR DG LUMBAR SPINE COMPLETE 4+V
5 series · 5 of 5 positions shown · non-contrast
Comparison: None.

CLINICAL DATA: Back pain

EXAM:
LUMBAR SPINE - COMPLETE 4+ VIEW

[l-spine ap]
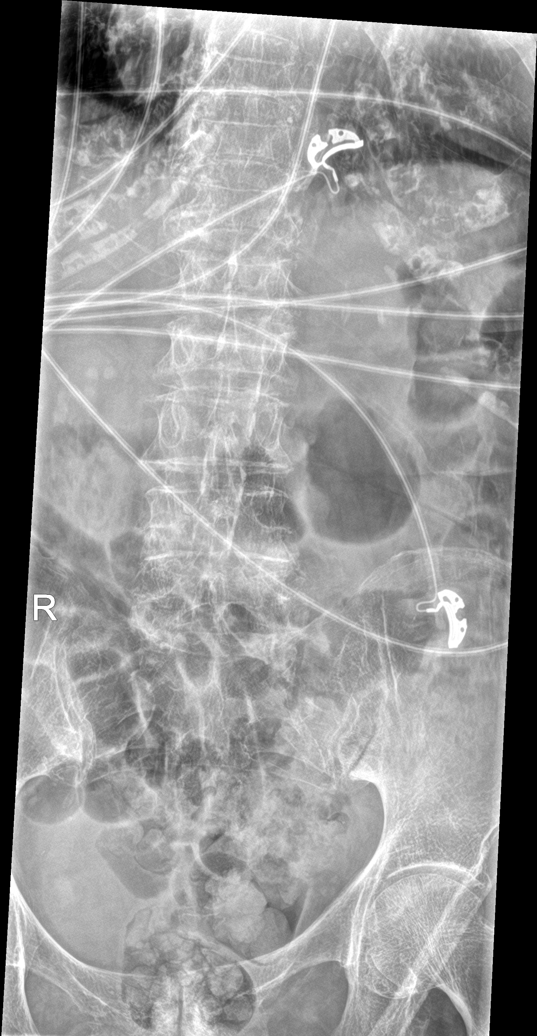

[l-spine obl (1 of 2)]
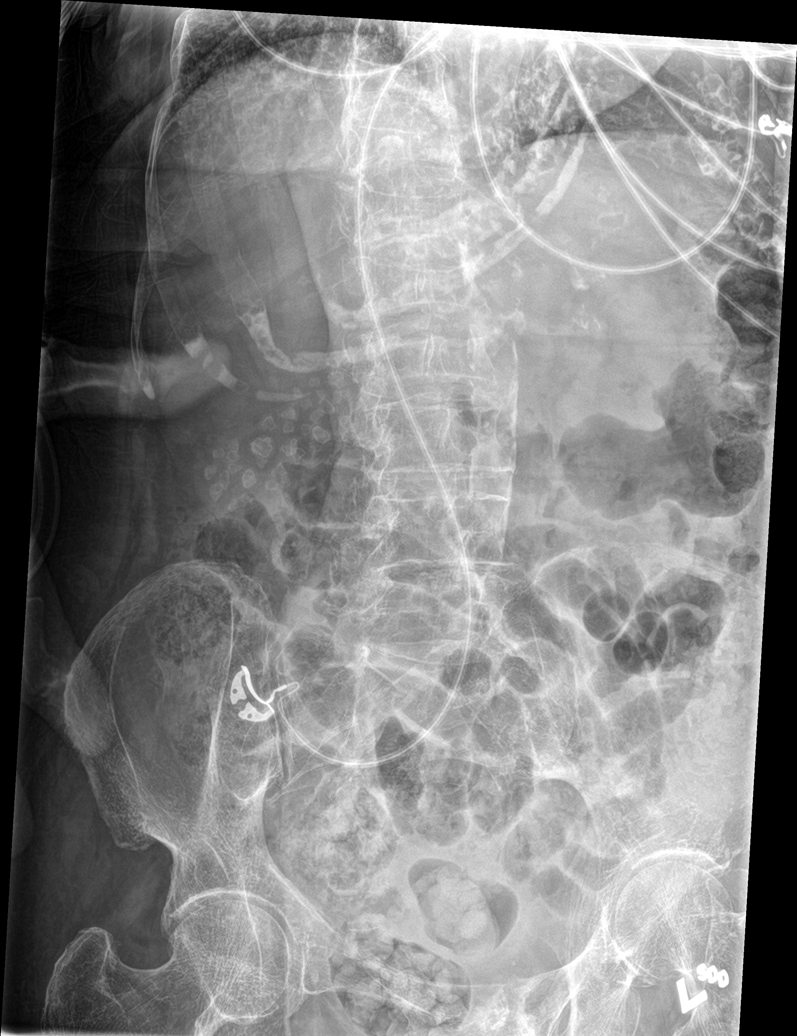

[l-spine obl (2 of 2)]
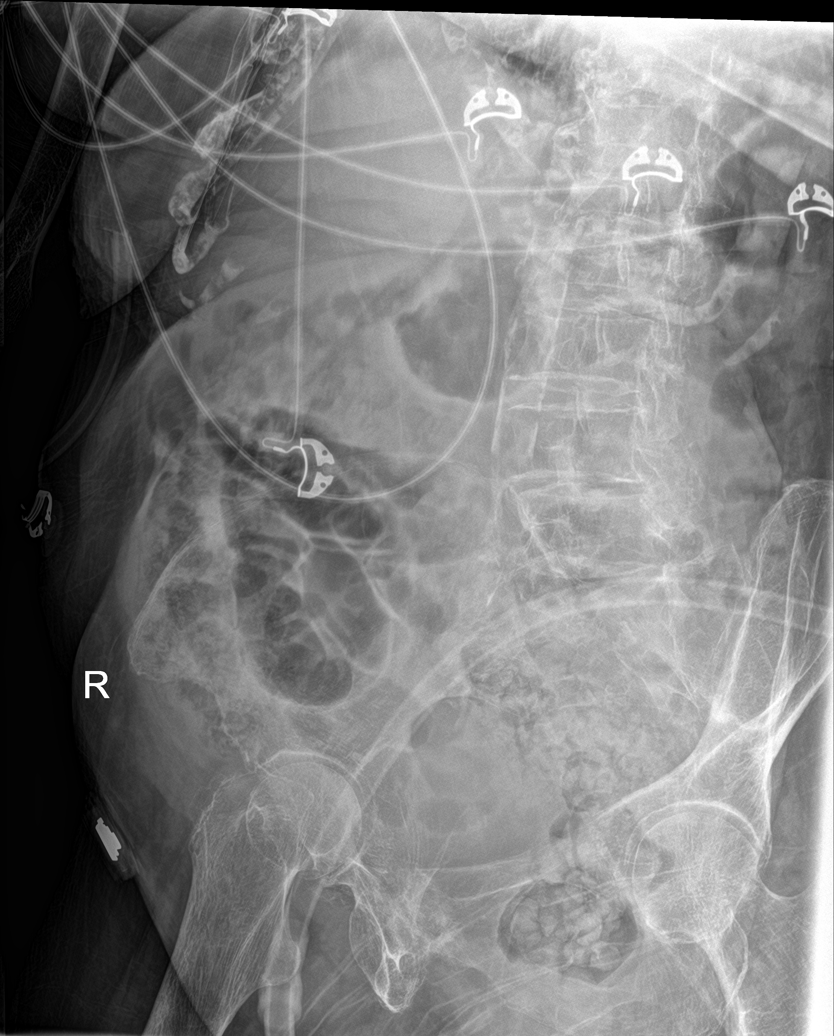

[l-spine lat (1 of 2)]
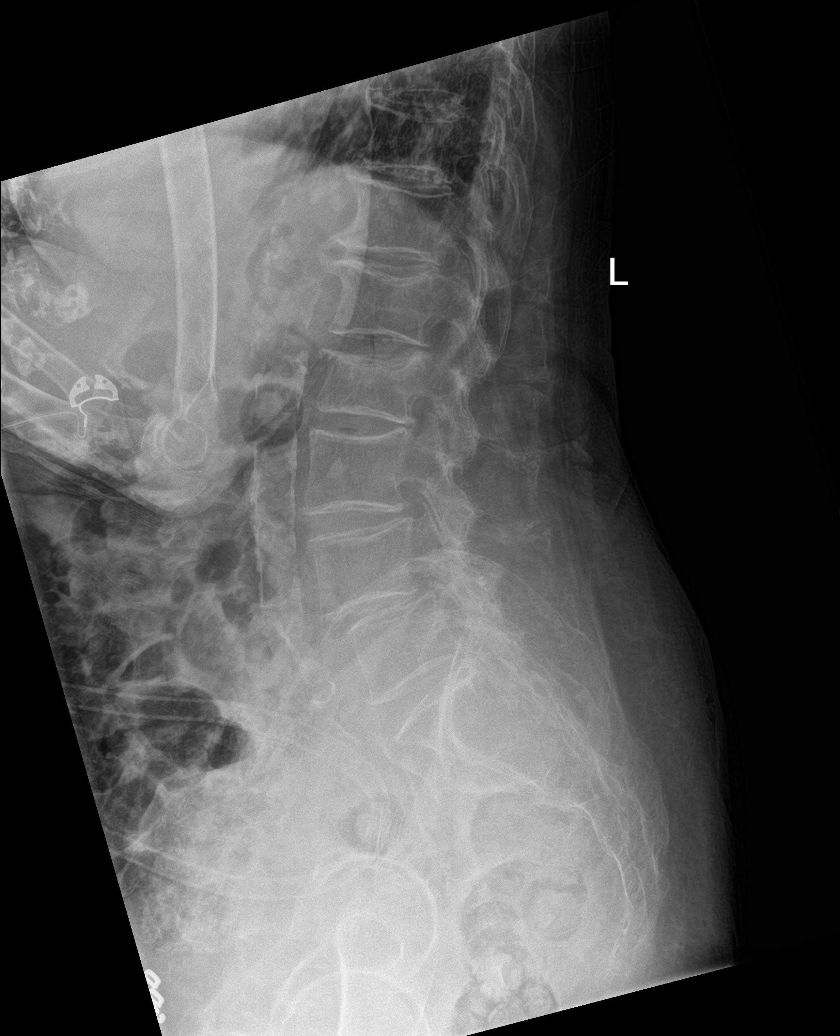

[l-spine lat (2 of 2)]
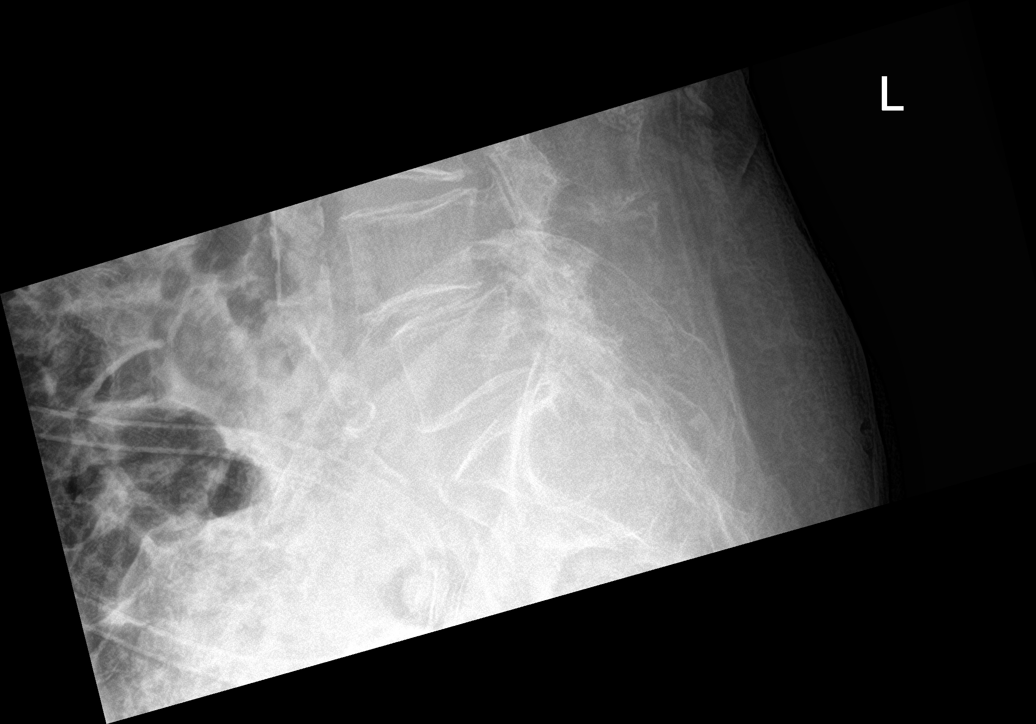

[5 of 5 positions shown; findings below may reference images not displayed]

FINDINGS: Five view radiograph lumbar spine demonstrates normal lumbar
lordosis. There is grade 1 anterolisthesis of L4 upon L5. Superior
endplate fracture of L2 is noted, age indeterminate, with
approximately 30-40% loss of height. This appears new since prior
chest radiograph of 04/01/2018. Remaining vertebral body height has
been preserved. There is intervertebral disc space narrowing at L4-5
in keeping with changes of moderate degenerative disc disease at
this level. Remaining intervertebral disc heights are preserved. The
paraspinal soft tissues are unremarkable. Vascular calcifications
noted within the abdominal aorta. Cholelithiasis is incidentally
noted.
IMPRESSION: Age-indeterminate L2 superior endplate fracture with approximately
30-40% loss of height.

Moderate degenerative disc disease L4-5 with cystic grade 1
anterolisthesis.

Cholelithiasis

## 2022-08-02 ENCOUNTER — Ambulatory Visit: Payer: Medicare Other

## 2022-08-02 DIAGNOSIS — R42 Dizziness and giddiness: Secondary | ICD-10-CM

## 2022-08-04 ENCOUNTER — Ambulatory Visit: Payer: Medicare Other

## 2022-08-11 ENCOUNTER — Ambulatory Visit: Payer: Medicare Other

## 2022-08-11 DIAGNOSIS — R42 Dizziness and giddiness: Secondary | ICD-10-CM | POA: Diagnosis not present

## 2022-08-11 NOTE — Therapy (Unsigned)
OUTPATIENT PHYSICAL THERAPY VESTIBULAR TREATMENT/DISCHARGE  Patient Name: Tracy Singh MRN: [id number] DOB:[birth year], 89 y.o., female Today's Date: 08/13/2022  END OF SESSION:  PT End of Session - 08/13/22 1102     Visit Number 11    Number of Visits 33    Date for PT Re-Evaluation 09/27/22    Authorization Type eval: 05/26/22, recertification: 08/02/22;    PT Start Time 1100    PT Stop Time 1145    PT Time Calculation (min) 45 min    Equipment Utilized During Treatment Gait belt    Activity Tolerance Patient tolerated treatment well    Behavior During Therapy WFL for tasks assessed/performed             Past Medical History:  Diagnosis Date   A-fib (HCC)    Sepsis (HCC) 03/21/2018   Von Willebrand disease (HCC)    Past Surgical History:  Procedure Laterality Date   ABDOMINAL HYSTERECTOMY     Patient Active Problem List   Diagnosis Date Noted   BPPV (benign paroxysmal positional vertigo), unspecified laterality 05/12/2022   Encephalopathy 05/10/2022   Atrial fibrillation with rapid ventricular response (HCC) 05/05/2020   Diarrhea, functional 05/15/2018   Atrial fibrillation, chronic (HCC) 03/10/2015   Essential hypertension 10/06/2013   Von Willebrand disease (HCC) 10/06/2013   PCP: Jenell Milliner, MD  REFERRING PROVIDER: Jenell Milliner, MD   REFERRING DIAG: G93.40 (ICD-10-CM) - Encephalopathy, unspecified  RATIONALE FOR EVALUATION AND TREATMENT: Rehabilitation  THERAPY DIAG: Dizziness and giddiness  ONSET DATE: 05/20/22  FOLLOW-UP APPT SCHEDULED WITH REFERRING PROVIDER: Yes   FROM INITIAL EVALUATION SUBJECTIVE:   Chief Complaint:  Vertigo  Pertinent History The morning of 05/10/22 around 5:00 pt fell at home. Her daughter and granddaughter went upstairs and helped her back into bed and about 7:45 they heard patient calling because she was dizzy. Shortly after the dizziness started she became confused and per daughter referred to her as her  mother instead of knowing that she is her daughter.  Given the persistent dizziness and confusion EMS was called and patient was taken to the ED. CT scan head and MRI brain did not show any acute findings. Vitals within normal range. Blood glucose 125, BUN 27, rest of CMP within normal range.  CBC Hb 11.9, UA not very impressive, and UDS negative. Due to acute mental status changes and dizziness hospitalist admitted patient for further management. During admission PT was consulted and stated "positive R/L dix Hallpike for BPPV with R sided posterior up beating nystagmus lasting ~30 seconds. L testing positive but no nystagmus noted." Patient's mental status returned to baseline and she was advised to follow-up with OP vestibular PT.  PMH includes multi-level lumbar compression fractures, a-fib, Von Willebrand disease, and HTN.   Description of dizziness: vertigo Frequency: Every morning with position changes; Duration: 5-10 minutes Symptom nature: positional Progression of symptoms since onset: no change History of similar episodes: Yes, several years ago pt had a short bout of vertigo. Daughter in law is a PT instructed daughter in CRT and symptoms resolved.  Provocative Factors: rolling in bed toward both sides; Easing Factors: Meclizine and waiting for symptoms to pass;  Auditory complaints (tinnitus, pain, drainage, hearing loss, aural fullness): No Vision changes (diplopia, visual field loss, recent changes, recent eye exam): No Chest pain/palpitations: No, history of afib but no current complaints. History of head injury/concussion: No, however pt did fall and it is unclear whether she might have struck her head or not; Migraines/headaches: Yes, history  of headaches and migraines. No migraines in recent years Nausea/vomiting: Yes, pt has experienced nausea and vomiting with vertigo Numbness/tingling: No Focal weakness: No Dysarthria/dysphagia/drop attacks: Yes, during initial episode  daughter reported some slurring of patient's speech but now resolved.   Has patient fallen in last 6 months? Yes, Number of falls: 1 Pertinent pain: No, history of multi-level compression fractures in lumbar spine Imaging: Yes, see history Prior level of function: Needs assistance with ADLs and Needs assistance with homemaking  PRECAUTIONS: None  WEIGHT BEARING RESTRICTIONS No  LIVING ENVIRONMENT: Lives with: lives with their family Lives in: House/apartment Stairs: Yes; stair lift to second level; Has following equipment at home: Dan Humphreys - 2 wheeled, shower chair, bed side commode, and Grab bars  PATIENT GOALS: Decrease dizziness in order to improve balance and decrease fall risk   OBJECTIVE EXAMINATION  POSTURE: Severe upper thoracic kyphosis noted;  CRANIAL NERVES II, III, IV, VI: Pupils equal and reactive to light, visual acuity and visual fields are intact, extraocular muscles are intact  VII: Facial strength is intact and symmetric bilaterally  VIII: Pt is very hard of hearing, wears hearing aids;  COORDINATION Deferred   RANGE OF MOTION Cervical Spine AROM is severely limited in extension, moderately limited in rotation bilaterally.   MANUAL MUSCLE TESTING BUE/BLE strength WNL without focal deficits  TRANSFERS/GAIT Independent for transfers and ambulation without assistive device   PATIENT SURVEYS FOTO: 50, predicted improvement to 62 ABC: To be completed DHI: To be completed  OCULOMOTOR / VESTIBULAR TESTING  Oculomotor Exam- Room Light  Findings Comments  Ocular Alignment normal   Ocular ROM abnormal Decreased vertical gaze noted and difficulty with finger follow due to lack of attention;  Spontaneous Nystagmus normal   Gaze-Holding Nystagmus normal   End-Gaze Nystagmus normal   Vergence (normal 2-3") not examined   Smooth Pursuit abnormal Saccadic  Cross-Cover Test not examined   Saccades not examined Pt struggles with understanding commands  VOR  Cancellation not examined   Left Head Impulse Unable to tolerate   Right Head Impulse Unable to tolerate   Static Acuity not examined   Dynamic Acuity not examined    Oculomotor Exam- Fixation Suppressed: Deferred  BPPV TESTS:  Symptoms Duration Intensity Nystagmus  L Dix-Hallpike Dizziness and vomiting >1 minute Severe None  R Dix-Hallpike Dizziness and vomiting >1 minute Severe None  L Head Roll Dizziness and vomiting >1 minute Severe None  R Head Roll Dizziness and vomiting >1 minute Severe None  L Sidelying Test      R Sidelying Test      (blank = not tested)  Clinical Test of Sensory Interaction for Balance (CTSIB): Deferred  06/17/22 UE MMT: MMT (out of 5) Right  Left   Shoulder   Flexion 4+ 4+  Extension    Abduction 4+ 4+  External rotation 5 4+  Internal rotation 5 4+      Elbow  Flexion 4 4  Extension 5 5  Pronation    Supination        Wrist  Flexion 5 5  Extension 5 5  Radial deviation    Ulnar deviation        MCP  Flexion 5 5  Extension 5 5  Abduction 5 5  Adduction 5 5  (* = pain; Blank rows = not tested)  LE MMT: MMT (out of 5) Right  Left   Hip flexion 4+ 4+  Hip extension    Hip abduction (seated) 5 5  Hip  adduction (seated) 5 5  Hip internal rotation    Hip external rotation    Knee flexion 4+ 4+  Knee extension 5 5  Ankle dorsiflexion 4 4  Ankle plantarflexion Strong Strong  (* = pain; Blank rows = not tested)  FUNCTIONAL OUTCOME MEASURES   06/17/22 07/07/22 07/29/22 Comments  BERG 42/56 46/56 49/56    TUG 18.0s 17.0s 13.4s   5TSTS 24.9s 16.5s 11.5s   6 Minute Walk Test Deferred Deferred 24' with CGA and front wheeled walker    10 Meter Gait Speed Self-selected: 17.8s = 0.56 m/s, fastest: 13.3s = 0.75 m/s Self-selected: 19.1s = 0.52 m/s, fastest: 14.0s = 0.71 m/s Self-selected: 14.5s = 0.69 m/s, fastest: fastest: 10.5s = 0.95 m/s   (blank = not tested)   TODAY'S TREATMENT    SUBJECTIVE: Daughter reports that pt has had  multiple days where she didn't want to get out of bed and had low motivation. She has also complained of significant dizziness for multiple days. Daughter continues to report some intermittent confusion and she struggles to know when her mom is using dizziness as an excuse to get out of doing things for herself.    PAIN: No pain;   Neuromuscular Re-education  BPPV TESTS:  Symptoms Duration Intensity Nystagmus  L Dix-Hallpike Dizziness  Mild None  R Dix-Hallpike Dizziness  Mild None  L Head Roll None   None  R Head Roll None   None  L Sidelying Test      R Sidelying Test      (blank = not tested)  Supine: BP: 149/61 mmHg, HR: 82 bpm, SpO2: 98% Seated: BP: 157/74 mmHg, HR: 90 bpm, SpO2: 96% Standing (1 minute): BP: 174/82 mmHg, HR: 102 bpm, SpO2: 98%  Extensive education with daughter regarding challenges related to patient's lack of motivation as well as plans for discharge.   PATIENT EDUCATION:  Education details: Discharge Person educated: Patient and daughter Education method: Explanation Education comprehension: verbalized understanding   HOME EXERCISE PROGRAM:  Continue current home/gym program for strength/balance;   ASSESSMENT:  CLINICAL IMPRESSION: Updated outcome measures with patient at her last visit. Her BERG balance scale improved from 42/56 at the initial evaluation to 49/56.  Her TUG improved from 18.0 seconds to 13.4 seconds and her 5 Times Sit to Stand improved from 24.9 seconds to 11.5 seconds. Her self-selected and fastest 75m gait speeds both improved as well. She ambulated 709' during the 6 Minute Walk Test with CGA and front wheeled walker. No normative values >90 yo however this appears to be a functional limited community distance for patient. Overall patient made excellent progress and achieved her goals. Daughter describes patient's fluctuating motivation and lethargy and concedes that her mom is likely depressed. Serving has a caregiver for her mother  creates considerable individual and interpersonal stress for herself and her family. Discussed a variety of options including in home private care, facilities, and the possibility of meeting with an elder law attorney to discuss planning. At this point pt has no additional PT needs and will be discharged on this date.   OBJECTIVE IMPAIRMENTS: Abnormal gait, decreased balance, decreased mobility, difficulty walking, decreased strength, dizziness, and postural dysfunction.   ACTIVITY LIMITATIONS: bending, standing, squatting, and bed mobility  PARTICIPATION LIMITATIONS: meal prep, cleaning, laundry, and community activity  PERSONAL FACTORS: Age, Fitness, Past/current experiences, Time since onset of injury/illness/exacerbation, and 3+ comorbidities: compression fractures, a-fib, migraines, HTN, and Von Willebrand disease  are also affecting patient's functional outcome.   REHAB  POTENTIAL: Fair    CLINICAL DECISION MAKING: Unstable/unpredictable  EVALUATION COMPLEXITY: High   GOALS:  SHORT TERM GOALS:   Pt will be independent with HEP for dizziness in order to decrease symptoms, improve balance,decrease fall risk, and improve function at home. Baseline: Goal status: ACHIEVED   LONG TERM GOALS: Target date: 09/27/2022   Pt will increase FOTO to at least 62 to demonstrate significant improvement in function at home related to dizziness.  Baseline: 05/26/22: 50; 08/02/22: Deferred Goal status: DISCONTINUED  2.  Pt will decrease DHI score by at least 18 points in order to demonstrate clinically significant reduction in disability related to dizziness.  Baseline: 05/26/22: To be completed  Goal status: DISCONTINUED  3.  Pt will improve ABC by at least 13% in order to demonstrate clinically significant improvement in balance confidence.      Baseline: 05/26/22: To be completed; 06/17/22: 25.6%; 08/02/22: Deferred Goal status: DISCONTINUED  4. Pt will report 50% improvement in vertigo symptoms in  order to decrease fall risk and improve her ability to roll in bed without triggering symptoms.        Baseline: 07/07/22: No episodes of vertigo in the last week; 08/02/22: 2 episodes over the last 2 weeks Goal status: IN PROGRESS  5. Pt will decrease 5TSTS to below 14 seconds in order to demonstrate clinically significant improvement in LE strength       Baseline: 06/17/22: 24.9, 07/07/22: 16.5s; 08/02/22: 11.5s Goal status: ACHIEVED  6. Pt will improve BERG to at least 49/56 points in order to demonstrate clinically significant improvement in balance.      Baseline: 06/17/22: 42/56, 07/07/22: 46/56; 08/02/22: 49/56; Goal status: ACHIEVED  PLAN: PT FREQUENCY: 1-2x/week  PT DURATION: 8 weeks  PLANNED INTERVENTIONS: Therapeutic exercises, Therapeutic activity, Neuromuscular re-education, Balance training, Gait training, Patient/Family education, Self Care, Joint mobilization, Joint manipulation, Vestibular training, Canalith repositioning, Orthotic/Fit training, DME instructions, Dry Needling, Electrical stimulation, Spinal manipulation, Spinal mobilization, Cryotherapy, Moist heat, Taping, Traction, Ultrasound, Ionotophoresis 4mg /ml Dexamethasone, Manual therapy, and Re-evaluation.  PLAN FOR NEXT SESSION: DISCHARGE  Sharalyn Ink Karin Griffith PT, DPT, GCS  Hamid Brookens, PT 08/13/2022, 11:02 AM

## 2022-08-18 ENCOUNTER — Ambulatory Visit: Payer: Medicare Other
